# Patient Record
Sex: Female | Born: 1995
Health system: Southern US, Community
[De-identification: ages and names within clinical notes are randomized; demographics above are authoritative.]

## PROBLEM LIST (undated history)

## (undated) DIAGNOSIS — F410 Panic disorder [episodic paroxysmal anxiety] without agoraphobia: Secondary | ICD-10-CM

## (undated) DIAGNOSIS — F419 Anxiety disorder, unspecified: Secondary | ICD-10-CM

## (undated) HISTORY — PX: TONSILLECTOMY: SUR1361

## (undated) HISTORY — DX: Panic disorder (episodic paroxysmal anxiety): F41.0

## (undated) HISTORY — DX: Anxiety disorder, unspecified: F41.9

## (undated) HISTORY — PX: ADENOIDECTOMY: SUR15

---

## 2015-08-18 DIAGNOSIS — G8929 Other chronic pain: Secondary | ICD-10-CM

## 2015-08-18 HISTORY — DX: Other chronic pain: G89.29

## 2016-11-04 DIAGNOSIS — F411 Generalized anxiety disorder: Secondary | ICD-10-CM | POA: Insufficient documentation

## 2018-01-12 ENCOUNTER — Encounter: Payer: Self-pay | Admitting: Family Medicine

## 2018-01-12 ENCOUNTER — Ambulatory Visit: Payer: 59 | Admitting: Family Medicine

## 2018-01-12 VITALS — BP 124/77 | HR 86 | Temp 98.8°F | Ht 60.0 in | Wt 152.0 lb

## 2018-01-12 DIAGNOSIS — R350 Frequency of micturition: Secondary | ICD-10-CM

## 2018-01-12 DIAGNOSIS — Z7689 Persons encountering health services in other specified circumstances: Secondary | ICD-10-CM | POA: Diagnosis not present

## 2018-01-12 DIAGNOSIS — M5442 Lumbago with sciatica, left side: Secondary | ICD-10-CM

## 2018-01-12 DIAGNOSIS — Z3041 Encounter for surveillance of contraceptive pills: Secondary | ICD-10-CM | POA: Diagnosis not present

## 2018-01-12 DIAGNOSIS — G8929 Other chronic pain: Secondary | ICD-10-CM | POA: Insufficient documentation

## 2018-01-12 DIAGNOSIS — Z6829 Body mass index (BMI) 29.0-29.9, adult: Secondary | ICD-10-CM

## 2018-01-12 LAB — MICROSCOPIC EXAMINATION: Renal Epithel, UA: NONE SEEN /hpf

## 2018-01-12 LAB — PREGNANCY, URINE: PREG TEST UR: NEGATIVE

## 2018-01-12 LAB — URINALYSIS, COMPLETE
Bilirubin, UA: NEGATIVE
GLUCOSE, UA: NEGATIVE
Ketones, UA: NEGATIVE
NITRITE UA: NEGATIVE
Protein, UA: NEGATIVE
Specific Gravity, UA: >1.03 — ABNORMAL HIGH (ref 1.005–1.030)
UUROB: 0.2 mg/dL (ref 0.2–1.0)
pH, UA: 5.5 (ref 5.0–7.5)

## 2018-01-12 MED ORDER — NORETHIN ACE-ETH ESTRAD-FE 1-20 MG-MCG(24) PO CHEW: 1.0000 | CHEWABLE_TABLET | Freq: Every day | ORAL | 12 refills | Status: DC

## 2018-01-12 MED ORDER — NAPROXEN 500 MG PO TABS: 500.0000 mg | ORAL_TABLET | Freq: Two times a day (BID) | ORAL | 1 refills | Status: DC

## 2018-01-12 NOTE — Addendum Note (Signed)
Addended by: Baruch Gouty on: 01/12/2018 02:31 PM   Modules accepted: Orders

## 2018-01-12 NOTE — Patient Instructions (Signed)

## 2018-01-12 NOTE — Progress Notes (Signed)
Subjective:    Patient ID: Katrina Lin, female    DOB: [birth year], 22 y.o.   MRN: [id number]  Chief Complaint:  Establish Care and Back Pain (chronic, started 18 months ago after birth of child )   HPI: Katrina Lin is a 22 y.o. female presenting on 01/12/2018 for Establish Care and Back Pain (chronic, started 18 months ago after birth of child )  Pt presents today to establish care. Pt states she just moved to the area from [place] and needs to establish care with a PCP. Pt states she has an appointment with her GYN in December, but she is out of her oral contraceptive pills and needs a refill. Pt also reports chronic back pain. Pt states this started about 18 months ago. States she was going to PT and this was beneficial for the pain. Denies known injury causing the back pain. States she has had xrays and a MRI that were normal. Pt signed a release form for medical records. Pt states she has been managing the pain with ibuprofen and exercises. States she would like another referral to PT. She reports she slipped down two stairs in her home 2 days ago exacerbating her back pain. She reports low midline aching to sharp back pain that radiates into her left leg at times. Pt denies numbness, weakness, loss of bowel or bladder function, or gait abnormalities. Pt states the pain is worse with certain movements and bending. Pt also reports urinary frequency, denies dysuria, fever, or chills.    Relevant past medical, surgical, family, and social history reviewed and updated as indicated.  Allergies and medications reviewed and updated.   Past Medical History:  Diagnosis Date  . Anxiety   . Panic attacks     History reviewed. No pertinent surgical history.  Social History   Socioeconomic History  . Marital status: Single    Spouse name: Not on file  . Number of children: Not on file  . Years of education: Not on file  . Highest education level: Not on file  Occupational  History  . Not on file  Social Needs  . Financial resource strain: Not on file  . Food insecurity:    Worry: Not on file    Inability: Not on file  . Transportation needs:    Medical: Not on file    Non-medical: Not on file  Tobacco Use  . Smoking status: Never Smoker  . Smokeless tobacco: Never Used  Substance and Sexual Activity  . Alcohol use: Not Currently  . Drug use: Never  . Sexual activity: Not on file  Lifestyle  . Physical activity:    Days per week: Not on file    Minutes per session: Not on file  . Stress: Not on file  Relationships  . Social connections:    Talks on phone: Not on file    Gets together: Not on file    Attends religious service: Not on file    Active member of club or organization: Not on file    Attends meetings of clubs or organizations: Not on file    Relationship status: Not on file  . Intimate partner violence:    Fear of current or ex partner: Not on file    Emotionally abused: Not on file    Physically abused: Not on file    Forced sexual activity: Not on file  Other Topics Concern  . Not on file  Social History Narrative  .  Not on file    Outpatient Encounter Medications as of 01/12/2018  Medication Sig  . [DISCONTINUED] ibuprofen (ADVIL,MOTRIN) 600 MG tablet Take 600 mg by mouth as needed.  . [DISCONTINUED] Norethin Ace-Eth Estrad-FE (MINASTRIN 24 FE PO) Take 1 tablet by mouth daily.  . naproxen (NAPROSYN) 500 MG tablet Take 1 tablet (500 mg total) by mouth 2 (two) times daily with a meal.  . Norethin Ace-Eth Estrad-FE 1-20 MG-MCG(24) CHEW Chew 1 tablet by mouth daily for 28 days.   No facility-administered encounter medications on file as of 01/12/2018.     No Known Allergies  Review of Systems  Constitutional: Negative for activity change, chills, fatigue and fever.  Respiratory: Negative for cough, chest tightness and shortness of breath.   Cardiovascular: Negative for chest pain, palpitations and leg swelling.    Gastrointestinal: Negative for abdominal pain, constipation, diarrhea, nausea and vomiting.  Genitourinary: Positive for frequency. Negative for decreased urine volume, difficulty urinating, dyspareunia, dysuria, enuresis, flank pain, genital sores, hematuria, menstrual problem, pelvic pain, urgency, vaginal bleeding, vaginal discharge and vaginal pain.  Musculoskeletal: Positive for back pain (lower back). Negative for gait problem, neck pain and neck stiffness.  Neurological: Negative for dizziness, weakness, light-headedness, numbness and headaches.  Psychiatric/Behavioral: Negative for confusion.  All other systems reviewed and are negative.       Objective:    BP 124/77 (BP Location: Right Arm, Cuff Size: Normal)   Pulse 86   Temp 98.8 F (37.1 C) (Oral)   Ht 5' (1.524 m)   Wt 152 lb (68.9 kg)   BMI 29.69 kg/m    Wt Readings from Last 3 Encounters:  01/12/18 152 lb (68.9 kg)    Physical Exam  Constitutional: She is oriented to person, place, and time. She appears well-developed and well-nourished. She is cooperative. No distress.  HENT:  Head: Normocephalic and atraumatic.  Right Ear: Hearing, tympanic membrane, external ear and ear canal normal.  Left Ear: Hearing, tympanic membrane, external ear and ear canal normal.  Nose: Nose normal.  Mouth/Throat: Uvula is midline, oropharynx is clear and moist and mucous membranes are normal.  Eyes: Pupils are equal, round, and reactive to light. EOM are normal.  Neck: Trachea normal, full passive range of motion without pain and phonation normal. Neck supple.  Cardiovascular: Normal rate, regular rhythm, normal heart sounds and intact distal pulses. Exam reveals no gallop and no friction rub.  No murmur heard. Pulmonary/Chest: Effort normal and breath sounds normal. No respiratory distress.  Abdominal: Soft. Bowel sounds are normal. There is no hepatosplenomegaly. There is no tenderness. There is no CVA tenderness.   Musculoskeletal:       Lumbar back: She exhibits tenderness and pain. She exhibits normal range of motion, no bony tenderness, no swelling, no edema, no deformity, no laceration, no spasm and normal pulse.       Back:  Neurological: She is alert and oriented to person, place, and time. She has normal strength and normal reflexes. No sensory deficit.  Skin: Skin is warm, dry and intact. Capillary refill takes less than 2 seconds.  Psychiatric: She has a normal mood and affect. Her speech is normal and behavior is normal. Judgment and thought content normal. Cognition and memory are normal.  Nursing note and vitals reviewed.   Urine pregnancy negative in office. Urine dip: negative for nitrites, 1+ leukocytes, and 1+ blood.    Pertinent labs & imaging results that were available during my care of the patient were reviewed by me  and considered in my medical decision making.  Assessment & Plan:  Katrina Lin was seen today for establish care and back pain.  Diagnoses and all orders for this visit:  Encounter to establish care  Chronic midline low back pain with left-sided sciatica Back strengthening exercises. Referral to PT. Ibuprofen switched to naproxen. Conservative measures for 4-6 weeks and then reevaluate. Will get medical records from previous provider.  -     naproxen (NAPROSYN) 500 MG tablet; Take 1 tablet (500 mg total) by mouth 2 (two) times daily with a meal. -     Ambulatory referral to Physical Therapy  Encounter for surveillance of contraceptive pills Urine pregnancy negative in office. Has been taking combined oral contraceptives without adverse reactions.  -     Pregnancy, urine -     Norethin Ace-Eth Estrad-FE 1-20 MG-MCG(24) CHEW; Chew 1 tablet by mouth daily for 28 days.  Frequency of urination Denies dysuria, fever, or chills. Recently treated for UTI. Urinalysis unremarkable in office (pt at end of menstrual cycle).  -     Urinalysis, Complete  BMI  29.0-29.9,adult Diet and exercise encouraged.      Continue all other maintenance medications.  Follow up plan: Return in about 4 weeks (around 02/09/2018), or if symptoms worsen or fail to improve.  Educational handout given for back exercises  The above assessment and management plan was discussed with the patient. The patient verbalized understanding of and has agreed to the management plan. Patient is aware to call the clinic if symptoms persist or worsen. Patient is aware when to return to the clinic for a follow-up visit. Patient educated on when it is appropriate to go to the emergency department.   Monia Pouch, FNP-C [facility] [phone]

## 2018-01-17 ENCOUNTER — Telehealth: Payer: Self-pay | Admitting: Family Medicine

## 2018-01-17 NOTE — Telephone Encounter (Signed)
TC to PA dept [phone] pending review Mom aware this is in process, Rx has been sent to pharmacy already & she can pay out of pocket if she would like for first Rx

## 2018-01-17 NOTE — Telephone Encounter (Signed)
Katrina Lin had sent this in as brand name only.  Did you get a prior authorization about this?

## 2018-01-31 ENCOUNTER — Encounter: Payer: Self-pay | Admitting: Physical Therapy

## 2018-01-31 ENCOUNTER — Other Ambulatory Visit: Payer: Self-pay

## 2018-01-31 ENCOUNTER — Ambulatory Visit: Payer: 59 | Attending: Family Medicine | Admitting: Physical Therapy

## 2018-01-31 DIAGNOSIS — M6281 Muscle weakness (generalized): Secondary | ICD-10-CM | POA: Insufficient documentation

## 2018-01-31 DIAGNOSIS — M545 Low back pain: Secondary | ICD-10-CM | POA: Diagnosis present

## 2018-01-31 DIAGNOSIS — G8929 Other chronic pain: Secondary | ICD-10-CM | POA: Diagnosis present

## 2018-01-31 NOTE — Therapy (Signed)
[facility] [place], [place], [address] Phone: [phone]   Fax:  [phone]  Physical Therapy Evaluation  Patient Details  Name: Katrina Lin MRN: [id number] Date of Birth: 07/08/1995 Referring Provider (PT): Darla Lesches, FNP   Encounter Date: 01/31/2018  PT End of Session - 01/31/18 1428    Visit Number  1    Number of Visits  12    Date for PT Re-Evaluation  03/21/18    Authorization Type  Progress note every 10th visit    PT Start Time  1350    PT Stop Time  1426    PT Time Calculation (min)  36 min    Activity Tolerance  Patient tolerated treatment well    Behavior During Therapy  [facility] for tasks assessed/performed       Past Medical History:  Diagnosis Date  . Anxiety   . Panic attacks     History reviewed. No pertinent surgical history.  There were no vitals filed for this visit.   Subjective Assessment - 01/31/18 1639    Subjective  Patient reports ongoing low back pain that began last year after having her baby. She stated she had an epidural during delivery. Patient reported she had physical therapy in [place] for 3 months which help with her back pain but she has not been consistent with exercises. Patient reports she has the most difficulties with bending to pick up child and bathe baby. Patient denies numbness and tingling. Pain at worst is an 8/10 and pain at best is 5/10. Patient's goals are to decrease pain, improve strength, improve ability to perform house activities and caretaking for baby and stand greater than 25 mins.    Limitations  House hold activities;Standing;Walking    How long can you stand comfortably?  <25 minutes    Diagnostic tests  X-Ray, MRI: normal    Patient Stated Goals  improve pain improve mechanics    Currently in Pain?  Yes    Pain Score  7     Pain Location  Back    Pain Orientation  Right;Left;Lower    Pain Descriptors / Indicators  Sore;Discomfort    Pain  Type  Chronic pain    Pain Onset  More than a month ago    Pain Frequency  Constant    Aggravating Factors   bending forward, lifting heavy objects    Pain Relieving Factors  laying down, heat, TENs unit         [facility] PT Assessment - 01/31/18 0001      Assessment   Medical Diagnosis  Chronic low back pain with left sided sciaticia    Referring Provider (PT)  Darla Lesches, FNP    Onset Date/Surgical Date  --   2018   Next MD Visit  N/A    Prior Therapy  yes, in [place]      Precautions   Precautions  None      Restrictions   Weight Bearing Restrictions  No      Balance Screen   Has the patient fallen in the past 6 months  Yes    How many times?  1 fell down steps    Has the patient had a decrease in activity level because of a fear of falling?   No    Is the patient reluctant to leave their home because of a fear of falling?   No      Home Environment   Living  Environment  Private residence    Living Arrangements  Children;Spouse/significant other   Family   Type of Home  House      Prior Function   Level of Independence  Independent      Posture/Postural Control   Posture/Postural Control  Postural limitations    Postural Limitations  Rounded Shoulders;Forward head;Increased lumbar lordosis      ROM / Strength   AROM / PROM / Strength  AROM;Strength      AROM   AROM Assessment Site  Lumbar    Lumbar Flexion  12 inches finger tip to floor    Lumbar Extension  15 degrees    Lumbar - Right Side Bend  19 inches finger tip to floor    Lumbar - Left Side Bend  19 inches finger tip to floor      Strength   Strength Assessment Site  Hip;Knee    Right/Left Hip  Right;Left    Right Hip Flexion  4/5    Right Hip Extension  3+/5    Right Hip ABduction  3+/5    Left Hip Flexion  4+/5    Left Hip Extension  3+/5    Left Hip ABduction  4-/5    Right/Left Knee  Right;Left    Right Knee Flexion  4-/5    Right Knee Extension  4/5    Left Knee Flexion  4/5    Left  Knee Extension  4/5      Flexibility   Soft Tissue Assessment /Muscle Length  --      Palpation   Palpation comment  tender to palpation to lumbar paraspinals and QL bilaterally      Transfers   Transfers  Independent with all Transfers      Ambulation/Gait   Gait Pattern  Within Functional Limits                Objective measurements completed on examination: See above findings.              PT Education - 01/31/18 1808    Education Details  draw ins, scapular retractions, bridging, hip adduction, hamstring stretches, calf stretches    Person(s) Educated  Patient    Methods  Explanation;Handout    Comprehension  Verbalized understanding;Returned demonstration       PT Short Term Goals - 01/31/18 1753      PT SHORT TERM GOAL #1   Title  STG=LTG        PT Long Term Goals - 01/31/18 1753      PT LONG TERM GOAL #1   Title  Patient will be independent with HEP and its progression    Time  6    Period  Weeks    Status  New      PT LONG TERM GOAL #2   Title  Patient will demonstrate 4+/5 or greater LE strength bilaterally to improve stability during functional tasks    Time  6    Period  Weeks    Status  New      PT LONG TERM GOAL #3   Title  Patient will demonstrate proper squat and lifting mechanics to protect back during functional tasks and lifting child.    Time  6    Period  Weeks    Status  New      PT LONG TERM GOAL #4   Title  Patient will report ability to perform ADLs and child care duties with low back pain less  than or equal to 3/10.    Time  6    Period  Weeks    Status  New             Plan - 01/31/18 1810    Clinical Impression Statement  Patient is a 22 year old female who presents to physical therapy with localized bilateral low back pain and decreased LE strength bilaterally L>R. Patient noted with tenderness to palpation to bilateral QLs. Patient noted with normal gait pattern. Patient demonstrated poor squat  mechanics as noted by increased weight bearing on toes, poor hip hinge, and dynamic knee valgus during descent. Patient would benefit from skilled physical therapy to address deficits, improve body mechanics, and address patient's goals.     Clinical Presentation  Stable    Clinical Decision Making  Low    Rehab Potential  Excellent    PT Frequency  2x / week    PT Duration  6 weeks    PT Treatment/Interventions  Passive range of motion;Moist Heat;Ultrasound;Cryotherapy;Electrical Stimulation;Therapeutic exercise;Therapeutic activities;Functional mobility training;Stair training;Gait training;Neuromuscular re-education;Manual techniques;Patient/family education    PT Next Visit Plan  bike, core strengthening, LE strengthening, e-stim and moist heat for pain relief    PT Home Exercise Plan  see patient education section     Consulted and Agree with Plan of Care  Patient       Patient will benefit from skilled therapeutic intervention in order to improve the following deficits and impairments:  Pain, Decreased activity tolerance, Decreased range of motion, Decreased strength, Postural dysfunction  Visit Diagnosis: Chronic midline low back pain, unspecified whether sciatica present - Plan: PT plan of care cert/re-cert  Muscle weakness (generalized) - Plan: PT plan of care cert/re-cert     Problem List Patient Active Problem List   Diagnosis Date Noted  . Chronic midline low back pain with left-sided sciatica 01/12/2018  . BMI 29.0-29.9,adult 01/12/2018    Gabriela Eves, PT, DPT 01/31/2018, 6:17 PM  [facility] [address] [place], [place], [address] Phone: [phone]   Fax:  [phone]  Name: Khamiya Varin MRN: [id number] Date of Birth: [birth year]

## 2018-02-06 ENCOUNTER — Ambulatory Visit: Payer: 59 | Attending: Family Medicine | Admitting: Physical Therapy

## 2018-02-06 ENCOUNTER — Encounter: Payer: Self-pay | Admitting: Physical Therapy

## 2018-02-06 DIAGNOSIS — M545 Low back pain: Secondary | ICD-10-CM | POA: Insufficient documentation

## 2018-02-06 DIAGNOSIS — G8929 Other chronic pain: Secondary | ICD-10-CM | POA: Insufficient documentation

## 2018-02-06 DIAGNOSIS — M6281 Muscle weakness (generalized): Secondary | ICD-10-CM | POA: Diagnosis present

## 2018-02-06 NOTE — Therapy (Signed)
[facility] [place], [place], [address] Phone: [phone]   Fax:  [phone]  Physical Therapy Treatment  Patient Details  Name: Katrina Lin MRN: [id number] Date of Birth: 1995/12/09 Referring Provider (PT): Darla Lesches, FNP   Encounter Date: 02/06/2018  PT End of Session - 02/06/18 1520    Visit Number  2    Number of Visits  12    Date for PT Re-Evaluation  03/21/18    Authorization Type  Progress note every 10th visit    PT Start Time  4650    PT Stop Time  1615    PT Time Calculation (min)  59 min    Activity Tolerance  Patient tolerated treatment well    Behavior During Therapy  [facility] for tasks assessed/performed       Past Medical History:  Diagnosis Date  . Anxiety   . Panic attacks     History reviewed. No pertinent surgical history.  There were no vitals filed for this visit.  Subjective Assessment - 02/06/18 1518    Subjective  Reports compliance with HEP and some pain.     Limitations  House hold activities;Standing;Walking    How long can you stand comfortably?  <25 minutes    Diagnostic tests  X-Ray, MRI: normal    Patient Stated Goals  improve pain improve mechanics    Currently in Pain?  Yes    Pain Score  7     Pain Location  Back    Pain Orientation  Lower    Pain Descriptors / Indicators  Discomfort    Pain Type  Chronic pain    Pain Onset  More than a month ago         [facility] PT Assessment - 02/06/18 0001      Assessment   Medical Diagnosis  Chronic low back pain with left sided sciaticia    Referring Provider (PT)  Darla Lesches, FNP    Next MD Visit  N/A    Prior Therapy  yes, in [place]      Precautions   Precautions  None      Restrictions   Weight Bearing Restrictions  No                   [facility] Adult PT Treatment/Exercise - 02/06/18 0001      Exercises   Exercises  Lumbar      Lumbar Exercises: Aerobic   Stationary Bike  L2 x12 min      Lumbar Exercises: Supine   Ab Set  10 reps;5 seconds    Glut Set  15 reps;5 seconds    Clam  20 reps;Limitations    Clam Limitations  green theraband    Bridge  20 reps;5 seconds    Straight Leg Raise  20 reps      Modalities   Modalities  Electrical Stimulation;Moist Heat      Moist Heat Therapy   Number Minutes Moist Heat  15 Minutes    Moist Heat Location  Lumbar Spine      Electrical Stimulation   Electrical Stimulation Location  B lumbar paraspinals     Electrical Stimulation Action  Pre-Mod    Electrical Stimulation Parameters  80-150 hz x15 min    Electrical Stimulation Goals  Pain               PT Short Term Goals - 01/31/18 1753      PT SHORT TERM GOAL #  1   Title  STG=LTG        PT Long Term Goals - 02/06/18 1623      PT LONG TERM GOAL #1   Title  Patient will be independent with HEP and its progression    Time  6    Period  Weeks    Status  Achieved      PT LONG TERM GOAL #2   Title  Patient will demonstrate 4+/5 or greater LE strength bilaterally to improve stability during functional tasks    Time  6    Period  Weeks    Status  On-going      PT LONG TERM GOAL #3   Title  Patient will demonstrate proper squat and lifting mechanics to protect back during functional tasks and lifting child.    Time  6    Period  Weeks    Status  On-going      PT LONG TERM GOAL #4   Title  Patient will report ability to perform ADLs and child care duties with low back pain less than or equal to 3/10.    Time  6    Period  Weeks    Status  On-going            Plan - 02/06/18 1607    Clinical Impression Statement  Patient tolerated today's treatment well with 7/10 LBP. Patient able to be guided throughout therex with core activation. No reports of any increased lumbar pain with exercises today only reports of soreness. Patient reports more pain with functional activities such as bending forwards or caring for her daughter in the bathtub, etc. Patient  encouraged to continue HEP at home and HEP and therex would be progressed as necessary. Normal modalities response noted following removal of the modalities.    Rehab Potential  Excellent    PT Frequency  2x / week    PT Duration  6 weeks    PT Treatment/Interventions  Passive range of motion;Moist Heat;Ultrasound;Cryotherapy;Electrical Stimulation;Therapeutic exercise;Therapeutic activities;Functional mobility training;Stair training;Gait training;Neuromuscular re-education;Manual techniques;Patient/family education    PT Next Visit Plan  bike, core strengthening, LE strengthening, e-stim and moist heat for pain relief    PT Home Exercise Plan  see patient education section     Consulted and Agree with Plan of Care  Patient       Patient will benefit from skilled therapeutic intervention in order to improve the following deficits and impairments:  Pain, Decreased activity tolerance, Decreased range of motion, Decreased strength, Postural dysfunction  Visit Diagnosis: Chronic midline low back pain, unspecified whether sciatica present  Muscle weakness (generalized)     Problem List Patient Active Problem List   Diagnosis Date Noted  . Chronic midline low back pain with left-sided sciatica 01/12/2018  . BMI 29.0-29.9,adult 01/12/2018    Standley Brooking, PTA 02/06/2018, 4:24 PM  [facility] [address] [place], [place], [address] Phone: [phone]   Fax:  [phone]  Name: Katrina Lin MRN: [id number] Date of Birth: [birth year]

## 2018-02-09 ENCOUNTER — Ambulatory Visit: Payer: 59 | Admitting: Physical Therapy

## 2018-02-09 ENCOUNTER — Encounter: Payer: Self-pay | Admitting: Physical Therapy

## 2018-02-09 DIAGNOSIS — M6281 Muscle weakness (generalized): Secondary | ICD-10-CM

## 2018-02-09 DIAGNOSIS — M545 Low back pain: Secondary | ICD-10-CM | POA: Diagnosis not present

## 2018-02-09 DIAGNOSIS — G8929 Other chronic pain: Secondary | ICD-10-CM

## 2018-02-09 NOTE — Patient Instructions (Signed)
     Standing lat pull with theraband  Anchor bands higher than your head. Start with your arms straight out in front of you at shoulder height (or a little above).  Pull bands down next to your body and then slowly return to the starting position.  10-30 x1day      Upper / Lower Extremity Extension (All-Fours)    Tighten stomach and raise right leg and opposite arm. Keep trunk rigid. Repeat __5__ times per set. Do __1-2__ sets per session. Do __1-2__ sessions per day.   Hooklying Bridge with band  Raise your hips while maintaining a posterior pelvic tilt, abdominal draw in and pelvic floor tightening.  While elevated, push out against the band and hold.

## 2018-02-09 NOTE — Therapy (Signed)
[facility] [place], [place], [address] Phone: [phone]   Fax:  [phone]  Physical Therapy Treatment  Patient Details  Name: Katrina Lin MRN: [id number] Date of Birth: [birth year] Referring Provider (PT): Darla Lesches, FNP   Encounter Date: 02/09/2018  PT End of Session - 02/09/18 1554    Visit Number  3    Number of Visits  12    Date for PT Re-Evaluation  03/21/18    Authorization Type  Progress note every 10th visit    PT Start Time  1515    PT Stop Time  1608    PT Time Calculation (min)  53 min    Activity Tolerance  Patient tolerated treatment well    Behavior During Therapy  [facility] for tasks assessed/performed       Past Medical History:  Diagnosis Date  . Anxiety   . Panic attacks     History reviewed. No pertinent surgical history.  There were no vitals filed for this visit.  Subjective Assessment - 02/09/18 1519    Subjective  Patient reported doing HEP and some ongoing pain    Limitations  House hold activities;Standing;Walking    How long can you stand comfortably?  <25 minutes    Diagnostic tests  X-Ray, MRI: normal    Patient Stated Goals  improve pain improve mechanics    Currently in Pain?  Yes    Pain Score  5     Pain Location  Back    Pain Orientation  Lower    Pain Descriptors / Indicators  Discomfort    Pain Type  Chronic pain    Pain Onset  More than a month ago    Pain Frequency  Constant    Aggravating Factors   bening and lifting    Pain Relieving Factors  at rest                       [facility] Adult PT Treatment/Exercise - 02/09/18 0001      Exercises   Exercises  Lumbar      Lumbar Exercises: Aerobic   Nustep  58min L5 core focus LE only      Lumbar Exercises: Standing   Row  Strengthening;Both;20 reps;Theraband    Theraband Level (Row)  Other (comment)    Row Limitations  pink XTS    Shoulder Extension  Strengthening    Theraband Level (Shoulder  Extension)  Other (comment)    Shoulder Extension Limitations  pink XTS    Other Standing Lumbar Exercises  wall push ups 2x10      Lumbar Exercises: Supine   Bridge with Ball Squeeze  10 reps    Bridge with clamshell  10 reps   red t-band   Straight Leg Raise  20 reps      Lumbar Exercises: Quadruped   Single Arm Raise  Right;Left;5 reps;5 seconds    Straight Leg Raise  5 reps;3 seconds      Moist Heat Therapy   Number Minutes Moist Heat  15 Minutes    Moist Heat Location  Lumbar Spine      Electrical Stimulation   Electrical Stimulation Location  B lumbar paraspinals     Electrical Stimulation Action  IFC    Electrical Stimulation Parameters  80+150hz  x33min    Electrical Stimulation Goals  Pain             PT Education - 02/09/18 1558  Education Details  HEP progression    Person(s) Educated  Patient    Methods  Demonstration;Handout;Explanation    Comprehension  Verbalized understanding;Returned demonstration       PT Short Term Goals - 01/31/18 1753      PT SHORT TERM GOAL #1   Title  STG=LTG        PT Long Term Goals - 02/06/18 1623      PT LONG TERM GOAL #1   Title  Patient will be independent with HEP and its progression    Time  6    Period  Weeks    Status  Achieved      PT LONG TERM GOAL #2   Title  Patient will demonstrate 4+/5 or greater LE strength bilaterally to improve stability during functional tasks    Time  6    Period  Weeks    Status  On-going      PT LONG TERM GOAL #3   Title  Patient will demonstrate proper squat and lifting mechanics to protect back during functional tasks and lifting child.    Time  6    Period  Weeks    Status  On-going      PT LONG TERM GOAL #4   Title  Patient will report ability to perform ADLs and child care duties with low back pain less than or equal to 3/10.    Time  6    Period  Weeks    Status  On-going            Plan - 02/09/18 1558    Clinical Impression Statement  Patient  tolerated treatment well today. Patient able to progress with core activation exercises with good technique. HEP provided for progression. Educated patient on core activation and posture awareness techniques with ADL's, lifting and bending. Goals progressing at this time.     Rehab Potential  Excellent    PT Frequency  2x / week    PT Duration  6 weeks    PT Treatment/Interventions  Passive range of motion;Moist Heat;Ultrasound;Cryotherapy;Electrical Stimulation;Therapeutic exercise;Therapeutic activities;Functional mobility training;Stair training;Gait training;Neuromuscular re-education;Manual techniques;Patient/family education    PT Next Visit Plan  cont with bike, core strengthening, LE strengthening, e-stim and moist heat for pain relief    Consulted and Agree with Plan of Care  Patient       Patient will benefit from skilled therapeutic intervention in order to improve the following deficits and impairments:  Pain, Decreased activity tolerance, Decreased range of motion, Decreased strength, Postural dysfunction  Visit Diagnosis: Chronic midline low back pain, unspecified whether sciatica present  Muscle weakness (generalized)     Problem List Patient Active Problem List   Diagnosis Date Noted  . Chronic midline low back pain with left-sided sciatica 01/12/2018  . BMI 29.0-29.9,adult 01/12/2018    Elza Varricchio P, PTA 02/09/2018, 4:10 PM  [facility] [place], [place], [address] Phone: [phone]   Fax:  [phone]  Name: Addelyn Alleman MRN: [id number] Date of Birth: 08-29-1995

## 2018-02-13 ENCOUNTER — Encounter: Payer: Self-pay | Admitting: Physical Therapy

## 2018-02-13 ENCOUNTER — Ambulatory Visit: Payer: 59 | Admitting: Physical Therapy

## 2018-02-13 DIAGNOSIS — M545 Low back pain, unspecified: Secondary | ICD-10-CM

## 2018-02-13 DIAGNOSIS — G8929 Other chronic pain: Secondary | ICD-10-CM

## 2018-02-13 DIAGNOSIS — M6281 Muscle weakness (generalized): Secondary | ICD-10-CM

## 2018-02-13 NOTE — Therapy (Signed)
[facility] [place], [place], [address] Phone: [phone]   Fax:  [phone]  Physical Therapy Treatment  Patient Details  Name: Katrina Lin MRN: [id number] Date of Birth: 1995/11/27 Referring Provider (PT): Darla Lesches, FNP   Encounter Date: 02/13/2018  PT End of Session - 02/13/18 1521    Visit Number  4    Number of Visits  12    Date for PT Re-Evaluation  03/21/18    Authorization Type  Progress note every 10th visit    PT Start Time  1515    PT Stop Time  1609    PT Time Calculation (min)  54 min    Activity Tolerance  Patient tolerated treatment well    Behavior During Therapy  [facility] for tasks assessed/performed       Past Medical History:  Diagnosis Date  . Anxiety   . Panic attacks     History reviewed. No pertinent surgical history.  There were no vitals filed for this visit.  Subjective Assessment - 02/13/18 1522    Subjective  Patient reported feeling more pain today due to the rain.    Limitations  House hold activities;Standing;Walking    How long can you stand comfortably?  <25 minutes    Diagnostic tests  X-Ray, MRI: normal    Patient Stated Goals  improve pain improve mechanics    Currently in Pain?  Yes    Pain Score  8     Pain Location  Back    Pain Orientation  Lower    Pain Descriptors / Indicators  Discomfort    Pain Onset  More than a month ago    Pain Frequency  Constant         [facility] PT Assessment - 02/13/18 0001      Assessment   Medical Diagnosis  Chronic low back pain with left sided sciaticia    Referring Provider (PT)  Darla Lesches, FNP    Next MD Visit  N/A    Prior Therapy  yes, in [place]      Precautions   Precautions  None      Restrictions   Weight Bearing Restrictions  No                   [facility] Adult PT Treatment/Exercise - 02/13/18 0001      Exercises   Exercises  Lumbar      Lumbar Exercises: Aerobic   Nustep  Level 4 x15 mins  UE/LE with draw in      Lumbar Exercises: Standing   Wall Slides  20 reps;2 seconds    Row  Strengthening;Both;20 reps;Theraband    Theraband Level (Row)  Other (comment)    Row Limitations  Pink XTS    Shoulder Extension  Strengthening;Both;20 reps    Theraband Level (Shoulder Extension)  Other (comment)    Shoulder Extension Limitations  Pink XTS    Other Standing Lumbar Exercises  wall push ups 2x10    Other Standing Lumbar Exercises  wood chops 2x10 each      Lumbar Exercises: Supine   Bent Knee Raise  20 reps    Bridge with [employer]  20 reps      Modalities   Modalities  Electrical Stimulation;Moist Heat      Moist Heat Therapy   Number Minutes Moist Heat  15 Minutes    Moist Heat Location  Lumbar Spine      Electrical Stimulation  Electrical Stimulation Location  B lumbar paraspinals     Electrical Stimulation Action  IFC    Electrical Stimulation Parameters  80-150 hz x15 min    Electrical Stimulation Goals  Pain               PT Short Term Goals - 01/31/18 1753      PT SHORT TERM GOAL #1   Title  STG=LTG        PT Long Term Goals - 02/06/18 1623      PT LONG TERM GOAL #1   Title  Patient will be independent with HEP and its progression    Time  6    Period  Weeks    Status  Achieved      PT LONG TERM GOAL #2   Title  Patient will demonstrate 4+/5 or greater LE strength bilaterally to improve stability during functional tasks    Time  6    Period  Weeks    Status  On-going      PT LONG TERM GOAL #3   Title  Patient will demonstrate proper squat and lifting mechanics to protect back during functional tasks and lifting child.    Time  6    Period  Weeks    Status  On-going      PT LONG TERM GOAL #4   Title  Patient will report ability to perform ADLs and child care duties with low back pain less than or equal to 3/10.    Time  6    Period  Weeks    Status  On-going            Plan - 02/13/18 1921    Clinical Impression  Statement  Patient was able to tolerate treatment well despite reports of low back pain. Patient was able to demonstrate good form with all exercises and was able to demonstrate good core activation. Normal response to modaliites at end of session.    Clinical Presentation  Stable    Clinical Decision Making  Low    Rehab Potential  Excellent    PT Frequency  2x / week    PT Duration  6 weeks    PT Treatment/Interventions  Passive range of motion;Moist Heat;Ultrasound;Cryotherapy;Electrical Stimulation;Therapeutic exercise;Therapeutic activities;Functional mobility training;Stair training;Gait training;Neuromuscular re-education;Manual techniques;Patient/family education    PT Next Visit Plan  cont with bike, core strengthening, LE strengthening, e-stim and moist heat for pain relief    PT Home Exercise Plan  see patient education section     Consulted and Agree with Plan of Care  Patient       Patient will benefit from skilled therapeutic intervention in order to improve the following deficits and impairments:  Pain, Decreased activity tolerance, Decreased range of motion, Decreased strength, Postural dysfunction  Visit Diagnosis: Chronic midline low back pain, unspecified whether sciatica present  Muscle weakness (generalized)     Problem List Patient Active Problem List   Diagnosis Date Noted  . Chronic midline low back pain with left-sided sciatica 01/12/2018  . BMI 29.0-29.9,adult 01/12/2018   Gabriela Eves, PT, DPT 02/13/2018, 7:28 PM  [facility] [address] [place], [place], [address] Phone: [phone]   Fax:  [phone]  Name: Katrina Lin MRN: [id number] Date of Birth: May 07, 1995

## 2018-02-15 ENCOUNTER — Ambulatory Visit: Payer: 59 | Admitting: Physical Therapy

## 2018-02-15 ENCOUNTER — Encounter: Payer: Self-pay | Admitting: Physical Therapy

## 2018-02-15 DIAGNOSIS — M6281 Muscle weakness (generalized): Secondary | ICD-10-CM

## 2018-02-15 DIAGNOSIS — G8929 Other chronic pain: Secondary | ICD-10-CM

## 2018-02-15 DIAGNOSIS — M545 Low back pain, unspecified: Secondary | ICD-10-CM

## 2018-02-15 NOTE — Therapy (Signed)
[facility] [place], [place], [address] Phone: [phone]   Fax:  [phone]  Physical Therapy Treatment  Patient Details  Name: Katrina Lin MRN: [id number] Date of Birth: November 20, 1995 Referring Provider (PT): Darla Lesches, FNP   Encounter Date: 02/15/2018  PT End of Session - 02/15/18 1536    Visit Number  5    Number of Visits  12    Date for PT Re-Evaluation  03/21/18    Authorization Type  Progress note every 10th visit    PT Start Time  5329    PT Stop Time  1528    PT Time Calculation (min)  57 min    Activity Tolerance  Patient tolerated treatment well    Behavior During Therapy  [facility] for tasks assessed/performed       Past Medical History:  Diagnosis Date  . Anxiety   . Panic attacks     History reviewed. No pertinent surgical history.  There were no vitals filed for this visit.      [facility] PT Assessment - 02/15/18 0001      Assessment   Medical Diagnosis  Chronic low back pain with left sided sciaticia    Referring Provider (PT)  Darla Lesches, FNP    Next MD Visit  N/A    Prior Therapy  yes, in [place]                   [facility] Adult PT Treatment/Exercise - 02/15/18 0001      Exercises   Exercises  Lumbar      Lumbar Exercises: Aerobic   Nustep  Level 4 x15 mins UE/LE with draw in      Lumbar Exercises: Standing   Row  Strengthening;Both;20 reps;Theraband   2x15   Theraband Level (Row)  Other (comment)    Row Limitations  Pink XTS    Shoulder Extension  Strengthening;Both;20 reps;10 reps   2x15   Theraband Level (Shoulder Extension)  Other (comment)    Shoulder Extension Limitations  Pink XTS    Other Standing Lumbar Exercises  wall push ups 2x10      Lumbar Exercises: Supine   Bridge  20 reps;5 seconds    Single Leg Bridge  10 reps      Modalities   Modalities  Electrical Stimulation;Moist Heat      Moist Heat Therapy   Number Minutes Moist Heat  15 Minutes     Moist Heat Location  Lumbar Spine      Electrical Stimulation   Electrical Stimulation Location  B lumbar paraspinals     Electrical Stimulation Action  IFC    Electrical Stimulation Parameters  80-150 hz x15 mins    Electrical Stimulation Goals  Pain               PT Short Term Goals - 01/31/18 1753      PT SHORT TERM GOAL #1   Title  STG=LTG        PT Long Term Goals - 02/06/18 1623      PT LONG TERM GOAL #1   Title  Patient will be independent with HEP and its progression    Time  6    Period  Weeks    Status  Achieved      PT LONG TERM GOAL #2   Title  Patient will demonstrate 4+/5 or greater LE strength bilaterally to improve stability during functional tasks    Time  6  Period  Weeks    Status  On-going      PT LONG TERM GOAL #3   Title  Patient will demonstrate proper squat and lifting mechanics to protect back during functional tasks and lifting child.    Time  6    Period  Weeks    Status  On-going      PT LONG TERM GOAL #4   Title  Patient will report ability to perform ADLs and child care duties with low back pain less than or equal to 3/10.    Time  6    Period  Weeks    Status  On-going            Plan - 02/15/18 1537    Clinical Impression Statement  Patient was able to tolerate progression of treatment well with no reports of increased low back pain. Patient required intermittent verbal cuing for form but was able to demonstrate with improved form after. Patient instructed to continue exercises at home. Patient reported understanding. Normal response to modalities upon removal.     Clinical Presentation  Stable    Clinical Decision Making  Low    Rehab Potential  Excellent    PT Frequency  2x / week    PT Duration  6 weeks    PT Treatment/Interventions  Passive range of motion;Moist Heat;Ultrasound;Cryotherapy;Electrical Stimulation;Therapeutic exercise;Therapeutic activities;Functional mobility training;Stair training;Gait  training;Neuromuscular re-education;Manual techniques;Patient/family education    PT Next Visit Plan  cont with bike, core strengthening, LE strengthening, e-stim and moist heat for pain relief    PT Home Exercise Plan  see patient education section     Consulted and Agree with Plan of Care  Patient       Patient will benefit from skilled therapeutic intervention in order to improve the following deficits and impairments:  Pain, Decreased activity tolerance, Decreased range of motion, Decreased strength, Postural dysfunction  Visit Diagnosis: Chronic midline low back pain, unspecified whether sciatica present  Muscle weakness (generalized)     Problem List Patient Active Problem List   Diagnosis Date Noted  . Chronic midline low back pain with left-sided sciatica 01/12/2018  . BMI 29.0-29.9,adult 01/12/2018   Gabriela Eves, PT, DPT 02/15/2018, 3:38 PM  [facility] [place], [place], [address] Phone: [phone]   Fax:  [phone]  Name: Adrieanna Boteler MRN: [id number] Date of Birth: 1995-03-18

## 2018-02-20 ENCOUNTER — Encounter: Payer: Self-pay | Admitting: Physical Therapy

## 2018-02-20 ENCOUNTER — Ambulatory Visit: Payer: 59 | Admitting: Physical Therapy

## 2018-02-20 DIAGNOSIS — M545 Low back pain, unspecified: Secondary | ICD-10-CM

## 2018-02-20 DIAGNOSIS — G8929 Other chronic pain: Secondary | ICD-10-CM

## 2018-02-20 DIAGNOSIS — M6281 Muscle weakness (generalized): Secondary | ICD-10-CM

## 2018-02-20 NOTE — Therapy (Signed)
[facility] [place], [place], [address] Phone: [phone]   Fax:  [phone]  Physical Therapy Treatment  Patient Details  Name: Katrina Lin MRN: [id number] Date of Birth: 09-28-1995 Referring Provider (PT): Darla Lesches, FNP   Encounter Date: 02/20/2018  PT End of Session - 02/20/18 1510    Visit Number  6    Number of Visits  12    Date for PT Re-Evaluation  03/21/18    Authorization Type  Progress note every 10th visit    PT Start Time  1434    PT Stop Time  1526    PT Time Calculation (min)  52 min    Activity Tolerance  Patient tolerated treatment well    Behavior During Therapy  [facility] for tasks assessed/performed       Past Medical History:  Diagnosis Date  . Anxiety   . Panic attacks     History reviewed. No pertinent surgical history.  There were no vitals filed for this visit.  Subjective Assessment - 02/20/18 1435    Subjective  Patient reported some improvement today    Limitations  House hold activities;Standing;Walking    How long can you stand comfortably?  <25 minutes    Diagnostic tests  X-Ray, MRI: normal    Patient Stated Goals  improve pain improve mechanics    Currently in Pain?  Yes    Pain Score  7     Pain Location  Back    Pain Orientation  Lower    Pain Descriptors / Indicators  Discomfort    Pain Type  Chronic pain    Pain Onset  More than a month ago    Pain Frequency  Intermittent    Aggravating Factors   bending and lifting activity    Pain Relieving Factors  at rest                       [facility] Adult PT Treatment/Exercise - 02/20/18 0001      Lumbar Exercises: Aerobic   Nustep  Level 5 x15 mins UE/LE with draw in      Lumbar Exercises: Standing   Row  Strengthening;Both;20 reps;Theraband;10 reps    Theraband Level (Row)  Other (comment)    Row Limitations  pink XTS    Shoulder Extension  Strengthening;Both;20 reps;10 reps    Theraband Level  (Shoulder Extension)  Other (comment)    Shoulder Extension Limitations  pink XTS    Other Standing Lumbar Exercises  wall push ups 2x10      Lumbar Exercises: Supine   Bridge with clamshell  20 reps   red t-band   Other Supine Lumbar Exercises  bil leg lowering 2x10      Moist Heat Therapy   Number Minutes Moist Heat  15 Minutes    Moist Heat Location  Lumbar Spine      Electrical Stimulation   Electrical Stimulation Location  B lumbar paraspinals     Electrical Stimulation Action  IFC    Electrical Stimulation Parameters  80-150hz  x25min    Electrical Stimulation Goals  Pain               PT Short Term Goals - 01/31/18 1753      PT SHORT TERM GOAL #1   Title  STG=LTG        PT Long Term Goals - 02/06/18 1623      PT LONG TERM GOAL #1  Title  Patient will be independent with HEP and its progression    Time  6    Period  Weeks    Status  Achieved      PT LONG TERM GOAL #2   Title  Patient will demonstrate 4+/5 or greater LE strength bilaterally to improve stability during functional tasks    Time  6    Period  Weeks    Status  On-going      PT LONG TERM GOAL #3   Title  Patient will demonstrate proper squat and lifting mechanics to protect back during functional tasks and lifting child.    Time  6    Period  Weeks    Status  On-going      PT LONG TERM GOAL #4   Title  Patient will report ability to perform ADLs and child care duties with low back pain less than or equal to 3/10.    Time  6    Period  Weeks    Status  On-going            Plan - 02/20/18 1512    Clinical Impression Statement  Patient tolerated treatment well today and progressing with core activation activities. Patient has ongoing pain reported in low back with certain movements such as lifting and bending. Patient goals ongoing at this time due to pain deficts.     Rehab Potential  Excellent    PT Frequency  2x / week    PT Duration  6 weeks    PT Treatment/Interventions   Passive range of motion;Moist Heat;Ultrasound;Cryotherapy;Electrical Stimulation;Therapeutic exercise;Therapeutic activities;Functional mobility training;Stair training;Gait training;Neuromuscular re-education;Manual techniques;Patient/family education    PT Next Visit Plan  cont with bike, core strengthening, LE strengthening, e-stim and moist heat for pain relief    Consulted and Agree with Plan of Care  Patient       Patient will benefit from skilled therapeutic intervention in order to improve the following deficits and impairments:  Pain, Decreased activity tolerance, Decreased range of motion, Decreased strength, Postural dysfunction  Visit Diagnosis: Chronic midline low back pain, unspecified whether sciatica present  Muscle weakness (generalized)     Problem List Patient Active Problem List   Diagnosis Date Noted  . Chronic midline low back pain with left-sided sciatica 01/12/2018  . BMI 29.0-29.9,adult 01/12/2018    DUNFORD, CHRISTINA P, PTA 02/20/2018, 3:42 PM  [facility] [place], [place], [address] Phone: [phone]   Fax:  [phone]  Name: Katrina Lin MRN: [id number] Date of Birth: 1995-07-09

## 2018-02-21 ENCOUNTER — Encounter: Payer: Self-pay | Admitting: Physical Therapy

## 2018-02-21 ENCOUNTER — Ambulatory Visit: Payer: 59 | Admitting: Physical Therapy

## 2018-02-21 DIAGNOSIS — M545 Low back pain, unspecified: Secondary | ICD-10-CM

## 2018-02-21 DIAGNOSIS — G8929 Other chronic pain: Secondary | ICD-10-CM

## 2018-02-21 DIAGNOSIS — M6281 Muscle weakness (generalized): Secondary | ICD-10-CM

## 2018-02-21 NOTE — Therapy (Signed)
[facility] [place], [place], [address] Phone: [phone]   Fax:  [phone]  Physical Therapy Treatment  Patient Details  Name: Katrina Lin MRN: [id number] Date of Birth: [birth year] Referring Provider (PT): Darla Lesches, FNP   Encounter Date: 02/21/2018  PT End of Session - 02/21/18 1436    Visit Number  7    Number of Visits  12    Date for PT Re-Evaluation  03/21/18    Authorization Type  Progress note every 10th visit    PT Start Time  1433    PT Stop Time  1522    PT Time Calculation (min)  49 min    Activity Tolerance  Patient tolerated treatment well    Behavior During Therapy  [facility] for tasks assessed/performed       Past Medical History:  Diagnosis Date  . Anxiety   . Panic attacks     History reviewed. No pertinent surgical history.  There were no vitals filed for this visit.  Subjective Assessment - 02/21/18 1429    Subjective  Reports some low back discomfort today. Reports being able to pick up her daugter with less pain now.    Limitations  House hold activities;Standing;Walking    How long can you stand comfortably?  <25 minutes    Diagnostic tests  X-Ray, MRI: normal    Patient Stated Goals  improve pain improve mechanics    Currently in Pain?  Yes    Pain Score  9     Pain Location  Back    Pain Orientation  Lower    Pain Descriptors / Indicators  Discomfort    Pain Type  Chronic pain    Pain Onset  More than a month ago         [facility] PT Assessment - 02/21/18 0001      Assessment   Medical Diagnosis  Chronic low back pain with left sided sciaticia    Referring Provider (PT)  Darla Lesches, FNP    Next MD Visit  N/A    Prior Therapy  yes, in [place]      Precautions   Precautions  None      Restrictions   Weight Bearing Restrictions  No                   [facility] Adult PT Treatment/Exercise - 02/21/18 0001      Therapeutic Activites    Therapeutic Activities   Lifting    Lifting  Lifting/squatting techinque instruction with 14# box      Lumbar Exercises: Aerobic   Stationary Bike  L3 x10 min    UBE (Upper Arm Bike)  120 RPM x6 min forward/backward      Lumbar Exercises: Standing   Wall Slides  20 reps;5 seconds   with draw in   Row  Strengthening;Both;20 reps;Limitations    Row Limitations  Pink XTS    Shoulder Extension  Strengthening;Both;20 reps;Limitations    Shoulder Extension Limitations  Pink XTS    Other Standing Lumbar Exercises  wall push ups 2x10      Lumbar Exercises: Supine   Bridge with clamshell  20 reps   Green theraband   Other Supine Lumbar Exercises  Supine bicycle x20 reps      Modalities   Modalities  Electrical Stimulation;Moist Heat      Moist Heat Therapy   Number Minutes Moist Heat  10 Minutes    Moist Heat Location  Lumbar Spine      Electrical Stimulation   Electrical Stimulation Location  B lumbar paraspinals     Electrical Stimulation Action  IFC    Electrical Stimulation Parameters  80-150 hz x10 min    Electrical Stimulation Goals  Pain               PT Short Term Goals - 01/31/18 1753      PT SHORT TERM GOAL #1   Title  STG=LTG        PT Long Term Goals - 02/06/18 1623      PT LONG TERM GOAL #1   Title  Patient will be independent with HEP and its progression    Time  6    Period  Weeks    Status  Achieved      PT LONG TERM GOAL #2   Title  Patient will demonstrate 4+/5 or greater LE strength bilaterally to improve stability during functional tasks    Time  6    Period  Weeks    Status  On-going      PT LONG TERM GOAL #3   Title  Patient will demonstrate proper squat and lifting mechanics to protect back during functional tasks and lifting child.    Time  6    Period  Weeks    Status  On-going      PT LONG TERM GOAL #4   Title  Patient will report ability to perform ADLs and child care duties with low back pain less than or equal to 3/10.    Time  6    Period  Weeks     Status  On-going            Plan - 02/21/18 1525    Clinical Impression Statement  Patient tolerated today's treatment well although reporting 9/10 LBP. Patient able to complete all exercises well with no complaints of any increased pain. Postural demo and VCs were provided throughout treatment. Squat and lifting technique instructed today due to demo initial of wall squats by patient. Patient required multiple reps of demo to be able to complete the proper technique and may require further education. Normal modalities response noted following removal of the modalities.    Rehab Potential  Excellent    PT Frequency  2x / week    PT Duration  6 weeks    PT Treatment/Interventions  Passive range of motion;Moist Heat;Ultrasound;Cryotherapy;Electrical Stimulation;Therapeutic exercise;Therapeutic activities;Functional mobility training;Stair training;Gait training;Neuromuscular re-education;Manual techniques;Patient/family education    PT Next Visit Plan  cont with bike, core strengthening, LE strengthening, e-stim and moist heat for pain relief    PT Home Exercise Plan  see patient education section     Consulted and Agree with Plan of Care  Patient       Patient will benefit from skilled therapeutic intervention in order to improve the following deficits and impairments:  Pain, Decreased activity tolerance, Decreased range of motion, Decreased strength, Postural dysfunction  Visit Diagnosis: Chronic midline low back pain, unspecified whether sciatica present  Muscle weakness (generalized)     Problem List Patient Active Problem List   Diagnosis Date Noted  . Chronic midline low back pain with left-sided sciatica 01/12/2018  . BMI 29.0-29.9,adult 01/12/2018    Standley Brooking, PTA 02/21/2018, 3:33 PM  [facility] [address] [place], [place], [address] Phone: [phone]   Fax:  [phone]  Name: Katrina Lin MRN:  [id number] Date of Birth: 1995-10-09

## 2018-02-27 ENCOUNTER — Ambulatory Visit: Payer: 59 | Admitting: Physical Therapy

## 2018-02-27 ENCOUNTER — Encounter: Payer: Self-pay | Admitting: Physical Therapy

## 2018-02-27 DIAGNOSIS — M6281 Muscle weakness (generalized): Secondary | ICD-10-CM

## 2018-02-27 DIAGNOSIS — M545 Low back pain, unspecified: Secondary | ICD-10-CM

## 2018-02-27 DIAGNOSIS — G8929 Other chronic pain: Secondary | ICD-10-CM

## 2018-02-27 NOTE — Therapy (Signed)
[facility] [place], [place], [address] Phone: [phone]   Fax:  [phone]  Physical Therapy Treatment  Patient Details  Name: Katrina Lin MRN: [id number] Date of Birth: 09/23/1995 Referring Provider (PT): Darla Lesches, FNP   Encounter Date: 02/27/2018  PT End of Session - 02/27/18 1450    Visit Number  8    Number of Visits  12    Date for PT Re-Evaluation  03/21/18    Authorization Type  Progress note every 10th visit    PT Start Time  1430    PT Stop Time  1520    PT Time Calculation (min)  50 min    Activity Tolerance  Patient tolerated treatment well    Behavior During Therapy  [facility] for tasks assessed/performed       Past Medical History:  Diagnosis Date  . Anxiety   . Panic attacks     History reviewed. No pertinent surgical history.  There were no vitals filed for this visit.  Subjective Assessment - 02/27/18 1446    Subjective  "I'm feeling good, I feel like this is really helping."    Limitations  House hold activities;Standing;Walking    How long can you stand comfortably?  <25 minutes    Diagnostic tests  X-Ray, MRI: normal    Patient Stated Goals  improve pain improve mechanics    Currently in Pain?  Yes    Pain Score  3     Pain Orientation  Lower    Pain Descriptors / Indicators  Discomfort    Pain Type  Chronic pain    Pain Onset  More than a month ago    Pain Frequency  Intermittent                       [facility] Adult PT Treatment/Exercise - 02/27/18 0001      Lumbar Exercises: Aerobic   Nustep  Level 5 x15 mins UE/LE with draw in      Lumbar Exercises: Machines for Strengthening   Other Lumbar Machine Exercise  Lumbar flexion 40# 2x10    Other Lumbar Machine Exercise  Lumbar extension 40# 2x10      Lumbar Exercises: Standing   Wall Slides  20 reps;5 seconds   with draw i   Row  Strengthening;Both;20 reps;Limitations    Row Limitations  Pink XTS    Shoulder  Extension  Strengthening;Both;20 reps;Limitations    Shoulder Extension Limitations  pink XTS    Other Standing Lumbar Exercises  lat pull downs 2x10; wood chops 2x10 each      Modalities   Modalities  Electrical Stimulation;Moist Heat      Moist Heat Therapy   Number Minutes Moist Heat  10 Minutes    Moist Heat Location  Lumbar Spine      Electrical Stimulation   Electrical Stimulation Location  B lumbar paraspinals     Electrical Stimulation Action  IFC    Electrical Stimulation Parameters  80-150 hz x10 min    Electrical Stimulation Goals  Pain               PT Short Term Goals - 01/31/18 1753      PT SHORT TERM GOAL #1   Title  STG=LTG        PT Long Term Goals - 02/06/18 1623      PT LONG TERM GOAL #1   Title  Patient will be independent with HEP and  its progression    Time  6    Period  Weeks    Status  Achieved      PT LONG TERM GOAL #2   Title  Patient will demonstrate 4+/5 or greater LE strength bilaterally to improve stability during functional tasks    Time  6    Period  Weeks    Status  On-going      PT LONG TERM GOAL #3   Title  Patient will demonstrate proper squat and lifting mechanics to protect back during functional tasks and lifting child.    Time  6    Period  Weeks    Status  On-going      PT LONG TERM GOAL #4   Title  Patient will report ability to perform ADLs and child care duties with low back pain less than or equal to 3/10.    Time  6    Period  Weeks    Status  On-going            Plan - 02/27/18 1530    Clinical Impression Statement  Patient was able to tolerate treatment well as well as tolerate progression of exercises. Patient denied any increase of pain but did notice muscle weakness with lumbar machines. Patient was able to demonstrate good form with wall squats. No adverse affects noted upon removal of modalities.     Clinical Presentation  Stable    Clinical Decision Making  Low    Rehab Potential  Excellent     PT Frequency  2x / week    PT Duration  6 weeks    PT Treatment/Interventions  Passive range of motion;Moist Heat;Ultrasound;Cryotherapy;Electrical Stimulation;Therapeutic exercise;Therapeutic activities;Functional mobility training;Stair training;Gait training;Neuromuscular re-education;Manual techniques;Patient/family education    PT Next Visit Plan  cont with bike, core strengthening, LE strengthening, e-stim and moist heat for pain relief    Consulted and Agree with Plan of Care  Patient       Patient will benefit from skilled therapeutic intervention in order to improve the following deficits and impairments:  Pain, Decreased activity tolerance, Decreased range of motion, Decreased strength, Postural dysfunction  Visit Diagnosis: Chronic midline low back pain, unspecified whether sciatica present  Muscle weakness (generalized)     Problem List Patient Active Problem List   Diagnosis Date Noted  . Chronic midline low back pain with left-sided sciatica 01/12/2018  . BMI 29.0-29.9,adult 01/12/2018   Gabriela Eves, PT, DPT 02/27/2018, 3:35 PM  [facility] [address] [place], [place], [address] Phone: [phone]   Fax:  [phone]  Name: Katrina Lin MRN: [id number] Date of Birth: Aug 11, 1995

## 2018-03-09 ENCOUNTER — Encounter: Payer: Self-pay | Admitting: Family Medicine

## 2018-03-09 ENCOUNTER — Ambulatory Visit: Payer: BLUE CROSS/BLUE SHIELD | Attending: Family Medicine | Admitting: Physical Therapy

## 2018-03-09 ENCOUNTER — Encounter: Payer: Self-pay | Admitting: Physical Therapy

## 2018-03-09 ENCOUNTER — Ambulatory Visit: Payer: BLUE CROSS/BLUE SHIELD | Admitting: Family Medicine

## 2018-03-09 VITALS — BP 139/87 | HR 85 | Temp 97.5°F | Ht 61.0 in | Wt 149.6 lb

## 2018-03-09 DIAGNOSIS — G8929 Other chronic pain: Secondary | ICD-10-CM | POA: Insufficient documentation

## 2018-03-09 DIAGNOSIS — M6281 Muscle weakness (generalized): Secondary | ICD-10-CM | POA: Insufficient documentation

## 2018-03-09 DIAGNOSIS — R590 Localized enlarged lymph nodes: Secondary | ICD-10-CM

## 2018-03-09 DIAGNOSIS — M545 Low back pain: Secondary | ICD-10-CM | POA: Insufficient documentation

## 2018-03-09 NOTE — Therapy (Addendum)
[facility] [place], [place], [address] Phone: [phone]   Fax:  [phone]  Physical Therapy Treatment  Patient Details  Name: Katrina Lin MRN: [id number] Date of Birth: 1995-03-29 Referring Provider (PT): Darla Lesches, FNP   Encounter Date: 03/09/2018  PT End of Session - 03/09/18 1445    Visit Number  9    Number of Visits  12    Date for PT Re-Evaluation  03/21/18    Authorization Type  Progress note every 10th visit    PT Start Time  1434    PT Stop Time  1513    PT Time Calculation (min)  39 min    Activity Tolerance  Patient tolerated treatment well    Behavior During Therapy  [facility] for tasks assessed/performed       Past Medical History:  Diagnosis Date  . Anxiety   . Panic attacks     History reviewed. No pertinent surgical history.  There were no vitals filed for this visit.  Subjective Assessment - 03/09/18 1435    Subjective  Reports having some increased LBP recently but currently have UTI and taking antibiotics.    Limitations  House hold activities;Standing;Walking    How long can you stand comfortably?  <25 minutes    Diagnostic tests  X-Ray, MRI: normal    Patient Stated Goals  improve pain improve mechanics    Currently in Pain?  Yes    Pain Score  5     Pain Location  Back    Pain Orientation  Lower    Pain Descriptors / Indicators  Discomfort    Pain Type  Chronic pain    Pain Onset  More than a month ago         [facility] PT Assessment - 03/09/18 0001      Assessment   Medical Diagnosis  Chronic low back pain with left sided sciaticia    Referring Provider (PT)  Darla Lesches, FNP    Next MD Visit  N/A    Prior Therapy  yes, in [place]      Precautions   Precautions  None      Restrictions   Weight Bearing Restrictions  No                   [facility] Adult PT Treatment/Exercise - 03/09/18 0001      Lumbar Exercises: Aerobic   Nustep  Level 5 x10 mins UE/LE  with draw in      Lumbar Exercises: Machines for Strengthening   Cybex Lumbar Extension  50# 2x10 reps    Other Lumbar Machine Exercise  Lumbar flexion 50# 2x10      Lumbar Exercises: Standing   Wall Slides  20 reps;5 seconds   VCs and demo for proper wall squat technique   Row  Strengthening;Both;20 reps;Limitations    Row Limitations  Pink XTS    Shoulder Extension  Strengthening;Both;20 reps;Limitations    Shoulder Extension Limitations  Pink XTS    Other Standing Lumbar Exercises  B chop wood Pink XTS x20 reps each      Modalities   Modalities  Electrical Stimulation;Moist Heat      Moist Heat Therapy   Number Minutes Moist Heat  10 Minutes    Moist Heat Location  Lumbar Spine      Electrical Stimulation   Electrical Stimulation Location  B lumbar paraspinals     Electrical Stimulation Action  Pre-Mod  Electrical Stimulation Parameters  80-150 hz x10 min    Electrical Stimulation Goals  Pain               PT Short Term Goals - 01/31/18 1753      PT SHORT TERM GOAL #1   Title  STG=LTG        PT Long Term Goals - 02/06/18 1623      PT LONG TERM GOAL #1   Title  Patient will be independent with HEP and its progression    Time  6    Period  Weeks    Status  Achieved      PT LONG TERM GOAL #2   Title  Patient will demonstrate 4+/5 or greater LE strength bilaterally to improve stability during functional tasks    Time  6    Period  Weeks    Status  On-going      PT LONG TERM GOAL #3   Title  Patient will demonstrate proper squat and lifting mechanics to protect back during functional tasks and lifting child.    Time  6    Period  Weeks    Status  On-going      PT LONG TERM GOAL #4   Title  Patient will report ability to perform ADLs and child care duties with low back pain less than or equal to 3/10.    Time  6    Period  Weeks    Status  On-going            Plan - 03/09/18 1506    Clinical Impression Statement  Patient presented in  clinic with reports of recent increased LBP which she thinks may be correlated to being treated for UTI. Patient able to complete all exercises well with VCs for core draw in as well VCs and demo to ensure proper squat technique. No complaints of any pain during therex session. Normal modalities response noted following removal of the modalities.    Rehab Potential  Excellent    PT Frequency  2x / week    PT Duration  6 weeks    PT Treatment/Interventions  Passive range of motion;Moist Heat;Ultrasound;Cryotherapy;Electrical Stimulation;Therapeutic exercise;Therapeutic activities;Functional mobility training;Stair training;Gait training;Neuromuscular re-education;Manual techniques;Patient/family education    PT Next Visit Plan  cont with bike, core strengthening, LE strengthening, e-stim and moist heat for pain relief    PT Home Exercise Plan  see patient education section     Consulted and Agree with Plan of Care  Patient       Patient will benefit from skilled therapeutic intervention in order to improve the following deficits and impairments:  Pain, Decreased activity tolerance, Decreased range of motion, Decreased strength, Postural dysfunction  Visit Diagnosis: Chronic midline low back pain, unspecified whether sciatica present  Muscle weakness (generalized)     Problem List Patient Active Problem List   Diagnosis Date Noted  . Chronic midline low back pain with left-sided sciatica 01/12/2018  . BMI 29.0-29.9,adult 01/12/2018    Standley Brooking, PTA 03/09/2018, 3:16 PM  [facility] [address] [place], [place], [address] Phone: [phone]   Fax:  [phone]  Name: Katrina Lin MRN: [id number] Date of Birth: 10-26-1995

## 2018-03-09 NOTE — Progress Notes (Signed)
BP 139/87   Pulse 85   Temp (!) 97.5 F (36.4 C) (Oral)   Ht 5\' 1"  (1.549 m)   Wt 149 lb 9.6 oz (67.9 kg)   BMI 28.27 kg/m    Subjective:    Patient ID: Katrina Lin, female    DOB: 08-17-1995, 23 y.o.   MRN: [id number]  HPI: Katrina Lin is a 22 y.o. female presenting on 03/09/2018 for lump on right side of neck (Patient states she noticed it x 3 days ago and the swelling has gone down. States it is painful when you push on it.)   HPI Swelling and lump on the right side of neck Patient is coming in with complaints of swelling and a lump on the right side of her neck just below her jawline that she noticed 3 days ago.  She says it has been quite tender but it has come down in size and swelling but is still slightly there and is still tender.  She is coming in to see what it could be and make sure that things are okay.  She denies any fevers or chills.  She has had some allergy-like symptoms and congestion and has been using her Claritin.  She is also been on an antibiotic for UTI currently.  Relevant past medical, surgical, family and social history reviewed and updated as indicated. Interim medical history since our last visit reviewed. Allergies and medications reviewed and updated.  Review of Systems  Constitutional: Negative for chills and fever.  HENT: Positive for congestion. Negative for ear discharge and ear pain.   Eyes: Negative for redness and visual disturbance.  Respiratory: Positive for cough. Negative for chest tightness and shortness of breath.   Cardiovascular: Negative for chest pain and leg swelling.  Genitourinary: Negative for difficulty urinating and dysuria.  Musculoskeletal: Negative for back pain and gait problem.  Skin: Negative for rash.  Neurological: Negative for light-headedness and headaches.  Psychiatric/Behavioral: Negative for agitation and behavioral problems.  All other systems reviewed and are negative.   Per HPI unless specifically  indicated above   Allergies as of 03/09/2018   No Known Allergies     Medication List       Accurate as of March 09, 2018  4:11 PM. Always use your most recent med list.        naproxen 500 MG tablet Commonly known as:  NAPROSYN Take 1 tablet (500 mg total) by mouth 2 (two) times daily with a meal.   nitrofurantoin (macrocrystal-monohydrate) 100 MG capsule Commonly known as:  MACROBID Take by mouth.   Norethin Ace-Eth Estrad-FE 1-20 MG-MCG(24) Chew Commonly known as:  MINASTRIN 24 FE Chew 1 tablet by mouth daily. Dispense brand name please          Objective:    BP 139/87   Pulse 85   Temp (!) 97.5 F (36.4 C) (Oral)   Ht 5\' 1"  (1.549 m)   Wt 149 lb 9.6 oz (67.9 kg)   BMI 28.27 kg/m   Wt Readings from Last 3 Encounters:  03/09/18 149 lb 9.6 oz (67.9 kg)  01/12/18 152 lb (68.9 kg)    Physical Exam Vitals signs and nursing note reviewed.  Constitutional:      General: She is not in acute distress.    Appearance: She is well-developed. She is not diaphoretic.  HENT:     Right Ear: Tympanic membrane, ear canal and external ear normal. There is no impacted cerumen.     Left  Ear: Ear canal and external ear normal. There is no impacted cerumen.     Nose: Nose normal. No congestion or rhinorrhea.     Mouth/Throat:     Mouth: Mucous membranes are moist.     Pharynx: Oropharynx is clear. No posterior oropharyngeal erythema.  Eyes:     Conjunctiva/sclera: Conjunctivae normal.  Cardiovascular:     Rate and Rhythm: Normal rate and regular rhythm.     Heart sounds: Normal heart sounds. No murmur.  Pulmonary:     Effort: Pulmonary effort is normal. No respiratory distress.     Breath sounds: Normal breath sounds. No wheezing.  Musculoskeletal: Normal range of motion.        General: No tenderness.  Lymphadenopathy:     Cervical: Cervical adenopathy (Small mobile and tender right anterior cervical lymph node) present.  Skin:    General: Skin is warm and dry.      Findings: No rash.  Neurological:     Mental Status: She is alert and oriented to person, place, and time.     Coordination: Coordination normal.  Psychiatric:        Behavior: Behavior normal.         Assessment & Plan:   Problem List Items Addressed This Visit    None    Visit Diagnoses    Anterior cervical lymphadenopathy    -  Primary   Single tender right anterior cervical lymph node, been there 3 days, observe      Gave reassurance and return if not gone in a couple months. Follow up plan: Return if symptoms worsen or fail to improve.  Counseling provided for all of the vaccine components No orders of the defined types were placed in this encounter.   Caryl Pina, MD [facility] 03/09/2018, 4:11 PM

## 2018-03-13 ENCOUNTER — Encounter: Payer: Self-pay | Admitting: Physical Therapy

## 2018-03-13 ENCOUNTER — Ambulatory Visit: Payer: BLUE CROSS/BLUE SHIELD | Admitting: Physical Therapy

## 2018-03-13 DIAGNOSIS — M545 Low back pain, unspecified: Secondary | ICD-10-CM

## 2018-03-13 DIAGNOSIS — G8929 Other chronic pain: Secondary | ICD-10-CM

## 2018-03-13 DIAGNOSIS — M6281 Muscle weakness (generalized): Secondary | ICD-10-CM

## 2018-03-13 NOTE — Therapy (Addendum)
[facility] [place], [place], [address] Phone: [phone]   Fax:  [phone]  Physical Therapy Treatment  Progress Note Reporting Period 01/31/18 to 16/2020  See note below for Objective Data and Assessment of Progress/Goals.      Patient Details  Name: Katrina Lin MRN: [id number] Date of Birth: 1996-02-03 Referring Provider (PT): Darla Lesches, FNP   Encounter Date: 03/13/2018  PT End of Session - 03/13/18 1500    Visit Number  10    Number of Visits  12    Date for PT Re-Evaluation  03/21/18    Authorization Type  Progress note every 10th visit    PT Start Time  1428    PT Stop Time  1513    PT Time Calculation (min)  45 min    Activity Tolerance  Patient tolerated treatment well    Behavior During Therapy  [facility] for tasks assessed/performed       Past Medical History:  Diagnosis Date  . Anxiety   . Panic attacks     History reviewed. No pertinent surgical history.  There were no vitals filed for this visit.  Subjective Assessment - 03/13/18 1437    Subjective  Patient arrived with increased pain may be due to lifting daughter    Limitations  House hold activities;Standing;Walking    How long can you stand comfortably?  <25 minutes    Diagnostic tests  X-Ray, MRI: normal    Patient Stated Goals  improve pain improve mechanics    Currently in Pain?  Yes    Pain Score  6     Pain Location  Back    Pain Orientation  Lower    Pain Descriptors / Indicators  Discomfort    Pain Type  Chronic pain    Pain Onset  More than a month ago    Pain Frequency  Intermittent    Aggravating Factors   lifting    Pain Relieving Factors  at rest                       [facility] Adult PT Treatment/Exercise - 03/13/18 0001      Lumbar Exercises: Aerobic   Nustep  Level 5 x10 mins UE/LE with draw in      Lumbar Exercises: Machines for Strengthening   Cybex Lumbar Extension  50# 2x10 reps    Other  Lumbar Machine Exercise  Lumbar flexion 50# 2x10      Lumbar Exercises: Standing   Wall Slides  20 reps;5 seconds    Row  Strengthening;Both;20 reps;Limitations;10 reps    Row Limitations  pink XTS    Shoulder Extension  Strengthening;Both;20 reps;Limitations;10 reps    Shoulder Extension Limitations  pink XTS standing on airex    Other Standing Lumbar Exercises  B chop wood Pink XTS x20 reps each      Moist Heat Therapy   Number Minutes Moist Heat  15 Minutes    Moist Heat Location  Lumbar Spine      Electrical Stimulation   Electrical Stimulation Location  B lumbar paraspinals     Electrical Stimulation Action  IFC    Electrical Stimulation Parameters  80-150hz  x66min    Electrical Stimulation Goals  Pain               PT Short Term Goals - 01/31/18 1753      PT SHORT TERM GOAL #1   Title  STG=LTG  PT Long Term Goals - 03/13/18 1501      PT LONG TERM GOAL #1   Title  Patient will be independent with HEP and its progression    Time  6    Period  Weeks    Status  Achieved      PT LONG TERM GOAL #2   Title  Patient will demonstrate 4+/5 or greater LE strength bilaterally to improve stability during functional tasks    Time  6    Period  Weeks    Status  On-going   NT 03/13/18     PT LONG TERM GOAL #3   Title  Patient will demonstrate proper squat and lifting mechanics to protect back during functional tasks and lifting child.    Time  6    Period  Weeks    Status  On-going   need to work on due to compensation with proper technique 03/13/18     PT LONG TERM GOAL #4   Title  Patient will report ability to perform ADLs and child care duties with low back pain less than or equal to 3/10.    Time  6    Period  Weeks    Status  On-going   up to 6/10 03/13/18           Plan - 03/13/18 1503    Clinical Impression Statement  Patient tolerated treatment fair today. patient reported 6/10 pain after lifting her daughter more. Today focused on same  exercises due to limitations. Patient still has compensations with exercises technique and with proper lifting/squatting technique. Current goals ongoing.     Rehab Potential  Excellent    PT Frequency  2x / week    PT Duration  6 weeks    PT Treatment/Interventions  Passive range of motion;Moist Heat;Ultrasound;Cryotherapy;Electrical Stimulation;Therapeutic exercise;Therapeutic activities;Functional mobility training;Stair training;Gait training;Neuromuscular re-education;Manual techniques;Patient/family education    PT Next Visit Plan  cont with bike, core strengthening, LE strengthening, and progress as able, e-stim and moist heat for pain relief    Consulted and Agree with Plan of Care  Patient       Patient will benefit from skilled therapeutic intervention in order to improve the following deficits and impairments:  Pain, Decreased activity tolerance, Decreased range of motion, Decreased strength, Postural dysfunction  Visit Diagnosis: Chronic midline low back pain, unspecified whether sciatica present  Muscle weakness (generalized)     Problem List Patient Active Problem List   Diagnosis Date Noted  . Chronic midline low back pain with left-sided sciatica 01/12/2018  . BMI 29.0-29.9,adult 01/12/2018   Ladean Raya, PTA 03/13/18 3:14 PM  [facility] [place], [place], [address] Phone: [phone]   Fax:  [phone]  Name: Katrina Lin MRN: [id number] Date of Birth: [birth year]

## 2018-03-16 ENCOUNTER — Encounter: Payer: Self-pay | Admitting: Physical Therapy

## 2018-04-04 ENCOUNTER — Ambulatory Visit: Payer: BLUE CROSS/BLUE SHIELD | Admitting: Physical Therapy

## 2018-04-04 ENCOUNTER — Encounter: Payer: Self-pay | Admitting: Physical Therapy

## 2018-04-04 DIAGNOSIS — M545 Low back pain, unspecified: Secondary | ICD-10-CM

## 2018-04-04 DIAGNOSIS — G8929 Other chronic pain: Secondary | ICD-10-CM

## 2018-04-04 DIAGNOSIS — M6281 Muscle weakness (generalized): Secondary | ICD-10-CM

## 2018-04-04 NOTE — Therapy (Signed)
[facility] [place], [place], [address] Phone: [phone]   Fax:  [phone]  Physical Therapy Treatment  Patient Details  Name: Katrina Lin MRN: [id number] Date of Birth: 1995-03-30 Referring Provider (PT): Darla Lesches, FNP   Encounter Date: 04/04/2018  PT End of Session - 04/04/18 1525    Visit Number  11    Number of Visits  18    Date for PT Re-Evaluation  04/28/18    Authorization Type  Progress note every 10th visit    PT Start Time  1515    PT Stop Time  1620    PT Time Calculation (min)  65 min    Activity Tolerance  Patient tolerated treatment well    Behavior During Therapy  [facility] for tasks assessed/performed       Past Medical History:  Diagnosis Date  . Anxiety   . Panic attacks     History reviewed. No pertinent surgical history.  There were no vitals filed for this visit.  Subjective Assessment - 04/04/18 1525    Subjective  Reports still having some LBP intermittantly lately. Reports that she feels as if she is improving but states that would like more visits.    Limitations  House hold activities;Standing;Walking    How long can you stand comfortably?  <25 minutes    Diagnostic tests  X-Ray, MRI: normal    Patient Stated Goals  improve pain improve mechanics    Currently in Pain?  Yes    Pain Score  6     Pain Location  Back    Pain Orientation  Lower    Pain Descriptors / Indicators  Discomfort    Pain Type  Chronic pain    Pain Onset  More than a month ago    Pain Frequency  Intermittent         [facility] PT Assessment - 04/04/18 0001      Assessment   Medical Diagnosis  Chronic low back pain with left sided sciaticia    Referring Provider (PT)  Darla Lesches, FNP    Next MD Visit  N/A    Prior Therapy  yes, in [place]      Precautions   Precautions  None      Restrictions   Weight Bearing Restrictions  No      ROM / Strength   AROM / PROM / Strength  Strength      AROM   Overall AROM   --    AROM Assessment Site  --    Right/Left Hip  --      Strength   Overall Strength  Deficits;Within functional limits for tasks performed    Strength Assessment Site  Hip    Right/Left Hip  Right;Left    Right Hip Flexion  4/5    Right Hip Extension  4/5    Right Hip ABduction  4+/5    Left Hip Flexion  4+/5    Left Hip Extension  4/5    Left Hip ABduction  4+/5                   [facility] Adult PT Treatment/Exercise - 04/04/18 0001      Lumbar Exercises: Aerobic   Nustep  L4 x15 min      Lumbar Exercises: Machines for Strengthening   Cybex Lumbar Extension  60# 3x10 reps    Other Lumbar Machine Exercise  Lumbar flexion 60# 3x10  Lumbar Exercises: Standing   Wall Slides  15 reps;4 seconds    Row  Strengthening;Both;20 reps;Limitations;10 reps    Row Limitations  Pink XTS    Shoulder Extension  Strengthening;Both;20 reps;10 reps    Shoulder Extension Limitations  Pink XTS    Other Standing Lumbar Exercises  B chop wood Pink XTS x20 reps each      Lumbar Exercises: Supine   Bridge with March  10 reps      Lumbar Exercises: Prone   Single Arm Raise  Right;Left;15 reps;3 seconds    Straight Leg Raise  15 reps;3 seconds    Opposite Arm/Leg Raise  Right arm/Left leg;Left arm/Right leg;10 reps    Other Prone Lumbar Exercises  Prone pillow squeeze x20 reps    Other Prone Lumbar Exercises  Prone W back x20 rpes      Modalities   Modalities  Electrical Stimulation;Moist Heat      Moist Heat Therapy   Number Minutes Moist Heat  10 Minutes    Moist Heat Location  Lumbar Spine      Electrical Stimulation   Electrical Stimulation Location  B lumbar paraspinals     Electrical Stimulation Action  Pre-Mod    Electrical Stimulation Parameters  80-150 hz x15 min    Electrical Stimulation Goals  Pain               PT Short Term Goals - 01/31/18 1753      PT SHORT TERM GOAL #1   Title  STG=LTG        PT Long Term Goals -  04/04/18 1526      PT LONG TERM GOAL #1   Title  Patient will be independent with HEP and its progression    Time  6    Period  Weeks    Status  Achieved      PT LONG TERM GOAL #2   Title  Patient will demonstrate 4+/5 or greater LE strength bilaterally to improve stability during functional tasks    Time  6    Period  Weeks    Status  Partially Met      PT LONG TERM GOAL #3   Title  Patient will demonstrate proper squat and lifting mechanics to protect back during functional tasks and lifting child.    Time  6    Period  Weeks    Status  Partially Met      PT LONG TERM GOAL #4   Title  Patient will report ability to perform ADLs and child care duties with low back pain less than or equal to 3/10.    Time  6    Period  Weeks    Status  Achieved            Plan - 04/04/18 1703    Clinical Impression Statement  Patient presented in clinic with 6/10 LBP. Patient able to complete more advanced machine strengthening and progressed for more postural and lumbar stabilization exercises in prone. Lack of lumbar stabilization noted with prone exercises. Greater weakness noted in RLE and especially with hip extensors. Improved squat and lifting technique but still minimal rounding in lumbar spine. Normal modalities response noted following removal of the modalities.    Rehab Potential  Excellent    PT Frequency  2x / week    PT Duration  6 weeks    PT Treatment/Interventions  Passive range of motion;Moist Heat;Ultrasound;Cryotherapy;Electrical Stimulation;Therapeutic exercise;Therapeutic activities;Functional mobility training;Stair training;Gait training;Neuromuscular re-education;Manual techniques;Patient/family  education    PT Next Visit Plan  Continue more lumbar stabilization and strengthening per POC.    PT Home Exercise Plan  see patient education section     Consulted and Agree with Plan of Care  Patient       Patient will benefit from skilled therapeutic intervention in  order to improve the following deficits and impairments:  Pain, Decreased activity tolerance, Decreased range of motion, Decreased strength, Postural dysfunction  Visit Diagnosis: Chronic midline low back pain, unspecified whether sciatica present  Muscle weakness (generalized)     Problem List Patient Active Problem List   Diagnosis Date Noted  . Chronic midline low back pain with left-sided sciatica 01/12/2018  . BMI 29.0-29.9,adult 01/12/2018    Standley Brooking, PTA 04/04/18 10:39 PM   [facility] [address] [place], [place], [address] Phone: [phone]   Fax:  [phone]  Name: Katrina Lin MRN: [id number] Date of Birth: Apr 17, 1995

## 2018-04-18 ENCOUNTER — Ambulatory Visit: Payer: BLUE CROSS/BLUE SHIELD | Attending: Family Medicine | Admitting: Physical Therapy

## 2018-04-18 ENCOUNTER — Encounter: Payer: Self-pay | Admitting: Physical Therapy

## 2018-04-18 DIAGNOSIS — M6281 Muscle weakness (generalized): Secondary | ICD-10-CM | POA: Diagnosis present

## 2018-04-18 DIAGNOSIS — M545 Low back pain, unspecified: Secondary | ICD-10-CM

## 2018-04-18 DIAGNOSIS — G8929 Other chronic pain: Secondary | ICD-10-CM

## 2018-04-18 NOTE — Therapy (Signed)
[facility] [place], [place], [address] Phone: [phone]   Fax:  [phone]  Physical Therapy Treatment  Patient Details  Name: Katrina Lin MRN: [id number] Date of Birth: [birth year] Referring Provider (PT): Darla Lesches, FNP   Encounter Date: 04/18/2018  PT End of Session - 04/18/18 1519    Visit Number  12    Number of Visits  18    Date for PT Re-Evaluation  04/28/18    Authorization Type  Progress note every 10th visit    PT Start Time  1518    PT Stop Time  1612    PT Time Calculation (min)  54 min    Activity Tolerance  Patient tolerated treatment well    Behavior During Therapy  [facility] for tasks assessed/performed       Past Medical History:  Diagnosis Date  . Anxiety   . Panic attacks     History reviewed. No pertinent surgical history.  There were no vitals filed for this visit.  Subjective Assessment - 04/18/18 1518    Subjective  Reports pain but correlates it to the rainy weather recently.    Limitations  House hold activities;Standing;Walking    How long can you stand comfortably?  <25 minutes    Diagnostic tests  X-Ray, MRI: normal    Patient Stated Goals  improve pain improve mechanics    Currently in Pain?  Yes    Pain Score  5     Pain Location  Back    Pain Orientation  Lower    Pain Descriptors / Indicators  Discomfort    Pain Type  Chronic pain    Pain Onset  More than a month ago    Pain Frequency  Intermittent         [facility] PT Assessment - 04/18/18 0001      Assessment   Medical Diagnosis  Chronic low back pain with left sided sciaticia    Referring Provider (PT)  Darla Lesches, FNP    Next MD Visit  N/A    Prior Therapy  yes, in [place]      Precautions   Precautions  None      Restrictions   Weight Bearing Restrictions  No                   [facility] Adult PT Treatment/Exercise - 04/18/18 0001      Lumbar Exercises: Aerobic   Nustep  L4 x15 min      Lumbar Exercises: Machines for Strengthening   Cybex Lumbar Extension  60# 3x10 reps    Other Lumbar Machine Exercise  Lumbar flexion 60# 3x10      Lumbar Exercises: Standing   Wall Slides  20 reps;5 seconds    Row  Strengthening;Both;20 reps;Limitations;10 reps    Row Limitations  Pink XTS    Shoulder Extension  Strengthening;Both;20 reps;10 reps    Shoulder Extension Limitations  Pink XTS    Other Standing Lumbar Exercises  B chop wood Pink XTS x20 reps each      Lumbar Exercises: Prone   Straight Leg Raise  15 reps;3 seconds               PT Short Term Goals - 01/31/18 1753      PT SHORT TERM GOAL #1   Title  STG=LTG        PT Long Term Goals - 04/04/18 1526      PT LONG TERM GOAL #  1   Title  Patient will be independent with HEP and its progression    Time  6    Period  Weeks    Status  Achieved      PT LONG TERM GOAL #2   Title  Patient will demonstrate 4+/5 or greater LE strength bilaterally to improve stability during functional tasks    Time  6    Period  Weeks    Status  Partially Met      PT LONG TERM GOAL #3   Title  Patient will demonstrate proper squat and lifting mechanics to protect back during functional tasks and lifting child.    Time  6    Period  Weeks    Status  Partially Met      PT LONG TERM GOAL #4   Title  Patient will report ability to perform ADLs and child care duties with low back pain less than or equal to 3/10.    Time  6    Period  Weeks    Status  Achieved            Plan - 04/18/18 1703    Clinical Impression Statement  Patient presented in clinic with reports of moderate LBP which she attributed to rainy weather. Patient guided through more advanced core and lumbar strengthening. Patient still requires VCs for lumbar stabilization in prone. No complaints of any increased LBP during any exercises today. Normal modalities response noted following removal of the modalities.    Rehab Potential  Excellent    PT  Frequency  2x / week    PT Duration  6 weeks    PT Treatment/Interventions  Passive range of motion;Moist Heat;Ultrasound;Cryotherapy;Electrical Stimulation;Therapeutic exercise;Therapeutic activities;Functional mobility training;Stair training;Gait training;Neuromuscular re-education;Manual techniques;Patient/family education    PT Next Visit Plan  Continue more lumbar stabilization and strengthening per POC.    PT Home Exercise Plan  see patient education section     Consulted and Agree with Plan of Care  Patient       Patient will benefit from skilled therapeutic intervention in order to improve the following deficits and impairments:  Pain, Decreased activity tolerance, Decreased range of motion, Decreased strength, Postural dysfunction  Visit Diagnosis: Chronic midline low back pain, unspecified whether sciatica present  Muscle weakness (generalized)     Problem List Patient Active Problem List   Diagnosis Date Noted  . Chronic midline low back pain with left-sided sciatica 01/12/2018  . BMI 29.0-29.9,adult 01/12/2018    Standley Brooking, PTA 04/18/2018, 5:07 PM  [facility] [address] [place], [place], [address] Phone: [phone]   Fax:  [phone]  Name: Katrina Lin MRN: [id number] Date of Birth: [birth year]

## 2018-04-25 ENCOUNTER — Encounter: Payer: Self-pay | Admitting: Physical Therapy

## 2018-04-25 ENCOUNTER — Ambulatory Visit: Payer: BLUE CROSS/BLUE SHIELD | Admitting: Physical Therapy

## 2018-04-25 DIAGNOSIS — G8929 Other chronic pain: Secondary | ICD-10-CM

## 2018-04-25 DIAGNOSIS — M6281 Muscle weakness (generalized): Secondary | ICD-10-CM

## 2018-04-25 DIAGNOSIS — M545 Low back pain, unspecified: Secondary | ICD-10-CM

## 2018-04-25 NOTE — Therapy (Addendum)
[facility] [place], [place], [address] Phone: [phone]   Fax:  [phone]  Physical Therapy Treatment  PHYSICAL THERAPY DISCHARGE SUMMARY  Visits from Start of Care: 13  Current functional level related to goals / functional outcomes: See below   Remaining deficits: See goals   Education / Equipment: HEP Plan: Patient agrees to discharge.  Patient goals were partially met. Patient is being discharged due to not returning since the last visit.  ?????   Gabriela Eves, PT, DPT 06/19/18  Patient Details  Name: Katrina Lin MRN: [id number] Date of Birth: 23-Feb-1996 Referring Provider (PT): Darla Lesches, FNP   Encounter Date: 04/25/2018  PT End of Session - 04/25/18 1532    Visit Number  13    Number of Visits  18    Date for PT Re-Evaluation  04/28/18    Authorization Type  Progress note every 10th visit    PT Start Time  1518    PT Stop Time  1608    PT Time Calculation (min)  50 min    Activity Tolerance  Patient tolerated treatment well    Behavior During Therapy  [facility] for tasks assessed/performed       Past Medical History:  Diagnosis Date  . Anxiety   . Panic attacks     History reviewed. No pertinent surgical history.  There were no vitals filed for this visit.  Subjective Assessment - 04/25/18 1521    Subjective  Reports her back is good today and not having much pain. Reports using squat technique to lift her daughter and more conscious of her posture.    Limitations  House hold activities;Standing;Walking    How long can you stand comfortably?  <25 minutes    Diagnostic tests  X-Ray, MRI: normal    Patient Stated Goals  improve pain improve mechanics    Currently in Pain?  Yes    Pain Score  3     Pain Location  Back    Pain Orientation  Lower    Pain Descriptors / Indicators  Discomfort    Pain Type  Chronic pain    Pain Onset  More than a month ago         [facility] PT  Assessment - 04/25/18 0001      Assessment   Medical Diagnosis  Chronic low back pain with left sided sciaticia    Referring Provider (PT)  Darla Lesches, FNP    Next MD Visit  N/A    Prior Therapy  yes, in [place]      Precautions   Precautions  None      Restrictions   Weight Bearing Restrictions  No                   [facility] Adult PT Treatment/Exercise - 04/25/18 0001      Lumbar Exercises: Aerobic   Stationary Bike  L3 x15 min      Lumbar Exercises: Machines for Strengthening   Cybex Lumbar Extension  70# x30 reps    Leg Press  2 pl, x20 reps with ball squeeze and core activation    Other Lumbar Machine Exercise  Lumbar flexion 70# 3x10      Lumbar Exercises: Standing   Wall Slides  15 reps;3 seconds   with core reachout   Shoulder Extension  Strengthening;Both;20 reps;10 reps    Shoulder Extension Limitations  Pink XTS    Other Standing Lumbar Exercises  B chop  wood Pink XTS x20 reps each      Modalities   Modalities  Electrical Stimulation;Moist Heat      Moist Heat Therapy   Number Minutes Moist Heat  15 Minutes    Moist Heat Location  Lumbar Spine      Electrical Stimulation   Electrical Stimulation Location  B lumbar paraspinals     Electrical Stimulation Action  Pre-Mod    Electrical Stimulation Parameters  80-150 hz x15 min    Electrical Stimulation Goals  Pain               PT Short Term Goals - 01/31/18 1753      PT SHORT TERM GOAL #1   Title  STG=LTG        PT Long Term Goals - 04/04/18 1526      PT LONG TERM GOAL #1   Title  Patient will be independent with HEP and its progression    Time  6    Period  Weeks    Status  Achieved      PT LONG TERM GOAL #2   Title  Patient will demonstrate 4+/5 or greater LE strength bilaterally to improve stability during functional tasks    Time  6    Period  Weeks    Status  Partially Met      PT LONG TERM GOAL #3   Title  Patient will demonstrate proper squat and lifting  mechanics to protect back during functional tasks and lifting child.    Time  6    Period  Weeks    Status  Partially Met      PT LONG TERM GOAL #4   Title  Patient will report ability to perform ADLs and child care duties with low back pain less than or equal to 3/10.    Time  6    Period  Weeks    Status  Achieved            Plan - 04/25/18 1559    Clinical Impression Statement  Patient presented in clinic with reports of low level LBP and more aware of her posture with ADLs and lifting her daughter. Patient progressed through more functional strengthening with LE inclusion. No complaints of pain reported by patient during today's treatment. Core activation VCs provided throughout therex. Normal modalities response noted following removal of the modalities.    Rehab Potential  Excellent    PT Frequency  2x / week    PT Duration  6 weeks    PT Treatment/Interventions  Passive range of motion;Moist Heat;Ultrasound;Cryotherapy;Electrical Stimulation;Therapeutic exercise;Therapeutic activities;Functional mobility training;Stair training;Gait training;Neuromuscular re-education;Manual techniques;Patient/family education    PT Next Visit Plan  Continue more lumbar stabilization and strengthening per POC.    PT Home Exercise Plan  see patient education section     Consulted and Agree with Plan of Care  Patient       Patient will benefit from skilled therapeutic intervention in order to improve the following deficits and impairments:  Pain, Decreased activity tolerance, Decreased range of motion, Decreased strength, Postural dysfunction  Visit Diagnosis: Chronic midline low back pain, unspecified whether sciatica present  Muscle weakness (generalized)     Problem List Patient Active Problem List   Diagnosis Date Noted  . Chronic midline low back pain with left-sided sciatica 01/12/2018  . BMI 29.0-29.9,adult 01/12/2018    Standley Brooking, PTA 04/25/2018, 4:11 PM  [facility] [place],  [place], [address] Phone: [phone]   Fax:  [phone]  Name: Katrina Lin MRN: [id number] Date of Birth: [birth year]

## 2018-06-27 ENCOUNTER — Ambulatory Visit (INDEPENDENT_AMBULATORY_CARE_PROVIDER_SITE_OTHER): Payer: BLUE CROSS/BLUE SHIELD | Admitting: Physician Assistant

## 2018-06-27 ENCOUNTER — Encounter: Payer: Self-pay | Admitting: Physician Assistant

## 2018-06-27 ENCOUNTER — Other Ambulatory Visit: Payer: Self-pay

## 2018-06-27 DIAGNOSIS — N3000 Acute cystitis without hematuria: Secondary | ICD-10-CM | POA: Diagnosis not present

## 2018-06-27 MED ORDER — NITROFURANTOIN MONOHYD MACRO 100 MG PO CAPS: 100.0000 mg | ORAL_CAPSULE | Freq: Two times a day (BID) | ORAL | 0 refills | Status: DC

## 2018-06-27 NOTE — Progress Notes (Signed)
    Telephone visit  Subjective: CC:uti PCP: Baruch Gouty, FNP RDE:[id number] Carbonell is a 23 y.o. female calls for telephone consult today. Patient provides verbal consent for consult held via phone.  Patient is identified with 2 separate identifiers.  At this time the entire area is on COVID-19 social distancing and stay home orders are in place.  Patient is of higher risk and therefore we are performing this by a virtual method.  Location of patient: home Location of provider: [facility] Others present for call: no  This patient has had several days of dysuria, frequency and nocturia. There is also pain over the bladder in the suprapubic region, no back pain. Denies leakage or hematuria.  Denies fever or chills. No pain in flank area.  ROS: Per HPI  No Known Allergies Past Medical History:  Diagnosis Date  . Anxiety   . Panic attacks     Current Outpatient Medications:  .  naproxen (NAPROSYN) 500 MG tablet, Take 1 tablet (500 mg total) by mouth 2 (two) times daily with a meal., Disp: 60 tablet, Rfl: 1 .  nitrofurantoin, macrocrystal-monohydrate, (MACROBID) 100 MG capsule, Take 1 capsule (100 mg total) by mouth 2 (two) times daily. 1 po BId, Disp: 14 capsule, Rfl: 0 .  Norethin Ace-Eth Estrad-FE (MINASTRIN 24 FE) 1-20 MG-MCG(24) CHEW, Chew 1 tablet by mouth daily. Dispense brand name please, Disp: 28 tablet, Rfl: 12  Assessment/ Plan: 23 y.o. female   1. Acute cystitis without hematuria - nitrofurantoin, macrocrystal-monohydrate, (MACROBID) 100 MG capsule; Take 1 capsule (100 mg total) by mouth 2 (two) times daily. 1 po BId  Dispense: 14 capsule; Refill: 0   Start time: 8:02 AM End time: 8:08 AM  Meds ordered this encounter  Medications  . nitrofurantoin, macrocrystal-monohydrate, (MACROBID) 100 MG capsule    Sig: Take 1 capsule (100 mg total) by mouth 2 (two) times daily. 1 po BId    Dispense:  14 capsule    Refill:  0    Order Specific Question:   Supervising Provider   Answer:   Janora Norlander [[id number]]    Particia Nearing PA-C [place] [phone]

## 2018-08-15 ENCOUNTER — Other Ambulatory Visit: Payer: Self-pay

## 2018-08-15 ENCOUNTER — Ambulatory Visit (INDEPENDENT_AMBULATORY_CARE_PROVIDER_SITE_OTHER): Payer: BLUE CROSS/BLUE SHIELD | Admitting: Family Medicine

## 2018-08-15 DIAGNOSIS — N3000 Acute cystitis without hematuria: Secondary | ICD-10-CM

## 2018-08-15 MED ORDER — NITROFURANTOIN MONOHYD MACRO 100 MG PO CAPS: 100.0000 mg | ORAL_CAPSULE | Freq: Two times a day (BID) | ORAL | 0 refills | Status: DC

## 2018-08-15 NOTE — Progress Notes (Signed)
Telephone visit  Subjective: CC: UTI PCP: Baruch Gouty, FNP LSL:[id number] Katrina Lin is a 23 y.o. female calls for telephone consult today. Patient provides verbal consent for consult held via phone.  Location of patient: home Location of provider: Working remotely from home Others present for call: none  1. Urinary symptoms Patient reports a 2 day h/o urinary urgency and lower abdominal pressure.  Denies urinary frequency, hematuria, fevers, chills, abdominal pain, nausea, vomiting.  Patient has used nothing for symptoms.  She reports that she took 2 at home urinary tract infection tests and they were somewhat positive.  Patient does have a h/o frequent or recurrent UTIs.  Last mental cycle was Jul 11, 2018.  She reports compliance with her OCP and is due for her menstrual cycle any day now.   ROS: Per HPI  No Known Allergies Past Medical History:  Diagnosis Date  . Anxiety   . Panic attacks     Current Outpatient Medications:  .  naproxen (NAPROSYN) 500 MG tablet, Take 1 tablet (500 mg total) by mouth 2 (two) times daily with a meal., Disp: 60 tablet, Rfl: 1 .  nitrofurantoin, macrocrystal-monohydrate, (MACROBID) 100 MG capsule, Take 1 capsule (100 mg total) by mouth 2 (two) times daily. 1 po BId, Disp: 14 capsule, Rfl: 0 .  Norethin Ace-Eth Estrad-FE (MINASTRIN 24 FE) 1-20 MG-MCG(24) CHEW, Chew 1 tablet by mouth daily. Dispense brand name please, Disp: 28 tablet, Rfl: 12  Assessment/ Plan: 23 y.o. female   1. Acute cystitis without hematuria No red flag signs or symptoms.  Doubt pyelonephritis.  More likely to be uncomplicated cystitis.  However, we did discuss that given recurrence of UTIs she should consider seeing a urologist at some point.  This was offered but patient declined the referral given COVID-19 outbreak.  We discussed reasons for reevaluation.  She will follow-up PRN - nitrofurantoin, macrocrystal-monohydrate, (MACROBID) 100 MG capsule; Take 1 capsule (100 mg total)  by mouth 2 (two) times daily. 1 po BId  Dispense: 14 capsule; Refill: 0   Start time: 8:16am End time: 8:22am  Total time spent on patient care (including telephone call/ virtual visit): 10 minutes  [place], [place] [phone]

## 2018-10-25 ENCOUNTER — Encounter: Payer: Self-pay | Admitting: Physician Assistant

## 2018-10-25 ENCOUNTER — Telehealth (INDEPENDENT_AMBULATORY_CARE_PROVIDER_SITE_OTHER): Payer: BLUE CROSS/BLUE SHIELD | Admitting: Physician Assistant

## 2018-10-25 DIAGNOSIS — N3000 Acute cystitis without hematuria: Secondary | ICD-10-CM | POA: Diagnosis not present

## 2018-10-25 MED ORDER — NITROFURANTOIN MONOHYD MACRO 100 MG PO CAPS: 100.0000 mg | ORAL_CAPSULE | Freq: Two times a day (BID) | ORAL | 0 refills | Status: DC

## 2018-10-25 NOTE — Progress Notes (Signed)
     Telephone visit  Subjective: CC: Urinary tract infection PCP: Baruch Gouty, FNP ACZ:[id number] Balles is a 23 y.o. female calls for video consult today. Patient provides verbal consent for consult held via phone.  Patient is identified with 2 separate identifiers.  At this time the entire area is on COVID-19 social distancing and stay home orders are in place.  Patient is of higher risk and therefore we are performing this by a virtual method.  Location of patient: Home Location of provider: [facility] Others present for call: None  This patient has had several days of dysuria, frequency and nocturia. There is also pain over the bladder in the suprapubic region, no back pain. Denies leakage or hematuria.  Denies fever or chills. No pain in flank area.  She did Azo-Standard test from over-the-counter.  It was positive.  She notes that she gets 3 or 4 infections a year.   ROS: Per HPI  No Known Allergies Past Medical History:  Diagnosis Date  . Anxiety   . Panic attacks     Current Outpatient Medications:  .  naproxen (NAPROSYN) 500 MG tablet, Take 1 tablet (500 mg total) by mouth 2 (two) times daily with a meal., Disp: 60 tablet, Rfl: 1 .  nitrofurantoin, macrocrystal-monohydrate, (MACROBID) 100 MG capsule, Take 1 capsule (100 mg total) by mouth 2 (two) times daily. 1 po BId, Disp: 14 capsule, Rfl: 0 .  Norethin Ace-Eth Estrad-FE (MINASTRIN 24 FE) 1-20 MG-MCG(24) CHEW, Chew 1 tablet by mouth daily. Dispense brand name please, Disp: 28 tablet, Rfl: 12  Assessment/ Plan: 23 y.o. female   1. Acute cystitis without hematuria - nitrofurantoin, macrocrystal-monohydrate, (MACROBID) 100 MG capsule; Take 1 capsule (100 mg total) by mouth 2 (two) times daily. 1 po BId  Dispense: 14 capsule; Refill: 0   No follow-ups on file.  Continue all other maintenance medications as listed above.  Start time: 3:38 PM End time: 3:44 PM  Meds ordered this encounter  Medications  .  nitrofurantoin, macrocrystal-monohydrate, (MACROBID) 100 MG capsule    Sig: Take 1 capsule (100 mg total) by mouth 2 (two) times daily. 1 po BId    Dispense:  14 capsule    Refill:  0    Order Specific Question:   Supervising Provider    Answer:   Janora Norlander [[id number]]    Particia Nearing PA-C [place] [phone]

## 2018-11-28 ENCOUNTER — Telehealth: Payer: Self-pay | Admitting: Family Medicine

## 2018-11-28 NOTE — Telephone Encounter (Signed)
REFERRAL REQUEST Telephone Note  What type of referral do you need?PHYSICAL THERAPY FOR HER BACK   Have you been seen at our office for this problem? In the past she has states we referred her she was going pre COVID but now needs a new order (Advise that they will likely need an appointment with their PCP before a referral can be done)  Is there a particular doctor or location that you prefer? Physical therapy next door  Patient notified that referrals can take up to a week or longer to process. If they haven't heard anything within a week they should call back and speak with the referral department.

## 2018-11-29 ENCOUNTER — Other Ambulatory Visit: Payer: Self-pay | Admitting: *Deleted

## 2018-11-29 ENCOUNTER — Telehealth: Payer: Self-pay | Admitting: Family Medicine

## 2018-11-29 DIAGNOSIS — M5442 Lumbago with sciatica, left side: Secondary | ICD-10-CM

## 2018-11-29 DIAGNOSIS — G8929 Other chronic pain: Secondary | ICD-10-CM

## 2018-11-29 NOTE — Telephone Encounter (Signed)
Referral placed.

## 2018-11-29 NOTE — Telephone Encounter (Signed)
Ok to place referral for PT

## 2018-12-07 ENCOUNTER — Other Ambulatory Visit: Payer: Self-pay

## 2018-12-07 ENCOUNTER — Ambulatory Visit: Payer: BLUE CROSS/BLUE SHIELD | Attending: Family Medicine | Admitting: Physical Therapy

## 2018-12-07 ENCOUNTER — Encounter: Payer: Self-pay | Admitting: Physical Therapy

## 2018-12-07 DIAGNOSIS — G8929 Other chronic pain: Secondary | ICD-10-CM | POA: Diagnosis not present

## 2018-12-07 DIAGNOSIS — M545 Low back pain, unspecified: Secondary | ICD-10-CM

## 2018-12-07 DIAGNOSIS — M6281 Muscle weakness (generalized): Secondary | ICD-10-CM | POA: Diagnosis not present

## 2018-12-07 DIAGNOSIS — M546 Pain in thoracic spine: Secondary | ICD-10-CM | POA: Insufficient documentation

## 2018-12-07 NOTE — Therapy (Signed)
[facility] [place], [place], [address] Phone: [phone]   Fax:  [phone]  Physical Therapy Evaluation  Patient Details  Name: Katrina Lin MRN: [id number] Date of Birth: 1996/03/03 Referring Provider (PT): Darla Lesches, FNP   Encounter Date: 12/07/2018  PT End of Session - 12/07/18 1357    Visit Number  1    Number of Visits  12    Date for PT Re-Evaluation  01/25/19    Authorization Type  Progress note every 10th visit    PT Start Time  1345    PT Stop Time  1430    PT Time Calculation (min)  45 min    Activity Tolerance  Patient tolerated treatment well    Behavior During Therapy  [facility] for tasks assessed/performed       Past Medical History:  Diagnosis Date  . Anxiety   . Panic attacks     History reviewed. No pertinent surgical history.  There were no vitals filed for this visit.   Subjective Assessment - 12/07/18 1440    Subjective  COVID-19 screening performed upon arrival.Patient arrives to physical therapy with an ongoing two year history of back pain. Patient reports pain exacerbated in the thoracolumbar spine about February 2020. Patient reports difficulties with home activities like washing dishes and caregiving activities like lifting her child and bathing her child. Patient reports pain at worst as 9/10 and pain at best is 3/10. Patient is compliant with HEP provided from last visit. Patient's goals are to decrease pain, improve movement, improve ability to perform home and care giving needs.    Pertinent History  anxiety    Limitations  Lifting;House hold activities;Standing    Patient Stated Goals  decrease pain, improve care giving activities for 23 year old    Currently in Pain?  Yes    Pain Score  4     Pain Location  Back    Pain Orientation  Mid;Lower    Pain Descriptors / Indicators  Sore    Pain Type  Chronic pain    Pain Onset  More than a month ago    Pain Frequency  Constant    Aggravating Factors   lifting, doing dishes, caregiving activities for toddler    Pain Relieving Factors  heat, TENs, heating pad    Effect of Pain on Daily Activities  difficulties with care giving activities and doing dishes         [facility] PT Assessment - 12/07/18 0001      Assessment   Medical Diagnosis  Chronic midline low back pain wiht left-sided sciatica    Referring Provider (PT)  Darla Lesches, FNP    Onset Date/Surgical Date  --   2 years ago   Next MD Visit  n/a    Prior Therapy  yes      Precautions   Precautions  None      Restrictions   Weight Bearing Restrictions  No      Balance Screen   Has the patient fallen in the past 6 months  No    Has the patient had a decrease in activity level because of a fear of falling?   No    Is the patient reluctant to leave their home because of a fear of falling?   No      Home Social worker  Private residence    Living Arrangements  Children;Spouse/significant other;Parent      Prior Function  Level of Independence  Independent      Posture/Postural Control   Posture/Postural Control  Postural limitations    Postural Limitations  Rounded Shoulders;Forward head      ROM / Strength   AROM / PROM / Strength  AROM;Strength      AROM   AROM Assessment Site  Lumbar    Lumbar Flexion  13" finger tip to floor    Lumbar - Right Side Bend  19" finger tip to floor    Lumbar - Left Side Bend  19.5" finger tip to floor      Strength   Strength Assessment Site  Shoulder;Hip;Knee    Right/Left Shoulder  Right;Left    Right Shoulder Flexion  3+/5    Right Shoulder ABduction  3+/5    Right Shoulder Internal Rotation  4-/5    Right Shoulder External Rotation  4-/5    Left Shoulder Flexion  3+/5    Left Shoulder ABduction  3+/5    Left Shoulder Internal Rotation  4-/5    Left Shoulder External Rotation  4-/5    Right/Left Hip  Right;Left    Right Hip Flexion  4/5    Right Hip Extension  3+/5    Right Hip  ABduction  4-/5    Left Hip Flexion  4/5    Left Hip Extension  3+/5    Left Hip ABduction  4-/5    Right/Left Knee  Right;Left    Right Knee Flexion  4/5    Right Knee Extension  4/5    Left Knee Flexion  4/5    Left Knee Extension  4/5      Palpation   Palpation comment  minimal tenderness to palpation to bilateral thoracolumbar paraspinals.      Transfers   Transfers  Independent with all Transfers      Ambulation/Gait   Gait Pattern  Within Functional Limits                Objective measurements completed on examination: See above findings.      [facility] Adult PT Treatment/Exercise - 12/07/18 0001      Modalities   Modalities  Electrical Stimulation;Moist Heat      Moist Heat Therapy   Number Minutes Moist Heat  10 Minutes    Moist Heat Location  Lumbar Spine      Electrical Stimulation   Electrical Stimulation Location  mid to lower back    Electrical Stimulation Action  IFC    Electrical Stimulation Parameters  80-150 hz x10 mins    Electrical Stimulation Goals  Pain             PT Education - 12/07/18 1445    Education Details  rows 45 degrees ABD, horizontal abduction, cat camel, trunk rotation    Person(s) Educated  Patient    Methods  Explanation;Demonstration;Handout    Comprehension  Verbalized understanding;Returned demonstration          PT Long Term Goals - 12/07/18 1615      PT LONG TERM GOAL #1   Title  Patient will be independent with HEP and its progression    Time  6    Period  Weeks    Status  New      PT LONG TERM GOAL #2   Title  Patient will demonstrate 4+/5 or greater LE strength bilaterally to improve stability during functional tasks    Time  6    Period  Weeks  Status  New      PT LONG TERM GOAL #3   Title  Patient will report ability to perform care giving activities and home activities with mid-low back pain less than or equal to 3/10.    Time  6    Period  Weeks    Status  New      PT LONG TERM GOAL  #4   Title  Patient will demonstrate proper body mechanics for household chores, child care needs, and lifting to protect back during activities.    Time  6    Period  Weeks    Status  New             Plan - 12/07/18 1448    Clinical Impression Statement  Patient is a 23 year old female who presents to physical therapy with decreased lumbar ROM, decreased shoulder MMT and abnormal posture. Patient noted with rounded shoulders forward head and increased thoracic kyphosis in sitting. Patient with minimal tenderness to palpation to thoracolumbar paraspinals. Patient and PT discussed HEP as well as plan of care to address goals. Patient reported agreement and understanding. Patient would benefit from skilled physical therapy to address deficits and address patient's goals.    Personal Factors and Comorbidities  Time since onset of injury/illness/exacerbation    Examination-Activity Limitations  Bend;Caring for Others;Carry;Lift    Stability/Clinical Decision Making  Stable/Uncomplicated    Clinical Decision Making  Low    Rehab Potential  Good    PT Frequency  2x / week    PT Duration  6 weeks    PT Treatment/Interventions  ADLs/Self Care Home Management;Cryotherapy;Electrical Stimulation;Iontophoresis 4mg /ml Dexamethasone;Moist Heat;Traction;Ultrasound;Therapeutic exercise;Therapeutic activities;Gait training;Stair training;Neuromuscular re-education;Patient/family education;Passive range of motion;Vasopneumatic Device;Manual techniques;Dry needling    PT Next Visit Plan  Nustep, bike, or elliptical. postural exercises. modalities PRN for pain relief    PT Home Exercise Plan  see patient education section    Consulted and Agree with Plan of Care  Patient       Patient will benefit from skilled therapeutic intervention in order to improve the following deficits and impairments:  Decreased activity tolerance, Decreased range of motion, Difficulty walking, Pain, Decreased strength, Postural  dysfunction  Visit Diagnosis: Pain in thoracic spine  Chronic midline low back pain, unspecified whether sciatica present  Muscle weakness (generalized)     Problem List Patient Active Problem List   Diagnosis Date Noted  . Chronic midline low back pain with left-sided sciatica 01/12/2018  . BMI 29.0-29.9,adult 01/12/2018   Gabriela Eves, PT, DPT 12/07/2018, 4:28 PM  [facility] [place], [place], [address] Phone: [phone]   Fax:  [phone]  Name: Katrina Lin MRN: [id number] Date of Birth: 1996-01-11

## 2018-12-07 NOTE — Addendum Note (Signed)
Addended by: Gabriela Eves on: 12/07/2018 04:31 PM   Modules accepted: Orders

## 2018-12-11 ENCOUNTER — Ambulatory Visit: Payer: BLUE CROSS/BLUE SHIELD | Admitting: Physical Therapy

## 2018-12-11 ENCOUNTER — Other Ambulatory Visit: Payer: Self-pay

## 2018-12-11 ENCOUNTER — Encounter: Payer: Self-pay | Admitting: Physical Therapy

## 2018-12-11 DIAGNOSIS — M6281 Muscle weakness (generalized): Secondary | ICD-10-CM

## 2018-12-11 DIAGNOSIS — M546 Pain in thoracic spine: Secondary | ICD-10-CM | POA: Diagnosis not present

## 2018-12-11 DIAGNOSIS — M545 Low back pain: Secondary | ICD-10-CM | POA: Diagnosis not present

## 2018-12-11 DIAGNOSIS — G8929 Other chronic pain: Secondary | ICD-10-CM

## 2018-12-11 NOTE — Therapy (Signed)
[facility] [place], [place], [address] Phone: [phone]   Fax:  [phone]  Physical Therapy Treatment  Patient Details  Name: Katrina Lin MRN: [id number] Date of Birth: 1995/11/09 Referring Provider (PT): Darla Lesches, FNP   Encounter Date: 12/11/2018  PT End of Session - 12/11/18 1344    Visit Number  2    Number of Visits  12    Date for PT Re-Evaluation  01/25/19    Authorization Type  Progress note every 10th visit    PT Start Time  0100    PT Stop Time  0157    PT Time Calculation (min)  57 min    Activity Tolerance  Patient tolerated treatment well    Behavior During Therapy  [facility] for tasks assessed/performed       Past Medical History:  Diagnosis Date  . Anxiety   . Panic attacks     History reviewed. No pertinent surgical history.  There were no vitals filed for this visit.  Subjective Assessment - 12/11/18 1258    Subjective  COVID-19 screening performed upon arrival. Patient arrived with ongoing pain in mid back    Pertinent History  anxiety    Limitations  Lifting;House hold activities;Standing    Patient Stated Goals  decrease pain, improve care giving activities for 23 year old    Pain Score  6     Pain Location  Back    Pain Orientation  Mid;Right;Left    Pain Descriptors / Indicators  Sore    Pain Type  Chronic pain    Pain Onset  More than a month ago    Pain Frequency  Constant    Aggravating Factors   lifting and bending    Pain Relieving Factors  rest and heat                       [facility] Adult PT Treatment/Exercise - 12/11/18 0001      Exercises   Exercises  Lumbar;Shoulder      Lumbar Exercises: Supine   Bridge with clamshell  20 reps;10 reps   red t-band     Shoulder Exercises: Prone   Other Prone Exercises  "W", "T", "V" 2x10 each way      Shoulder Exercises: Standing   Extension  Strengthening;Both;20 reps;10 reps;Other (comment)    Theraband Level  (Shoulder Extension)  Other (comment)    Extension Limitations  green XTS    Retraction  Strengthening;Both;20 reps;10 reps    Theraband Level (Shoulder Retraction)  Other (comment)    Retraction Weight (lbs)  green xts    Diagonals  20 reps;Theraband;Strengthening;Both    Theraband Level (Shoulder Diagonals)  Level 2 (Red)      Shoulder Exercises: ROM/Strengthening   Wall Pushups  20 reps      Moist Heat Therapy   Number Minutes Moist Heat  15 Minutes    Moist Heat Location  Lumbar Spine      Electrical Stimulation   Electrical Stimulation Location  mid to lower back    Electrical Stimulation Action  IFC    Electrical Stimulation Parameters  80-150hz  x15min    Electrical Stimulation Goals  Pain                  PT Long Term Goals - 12/07/18 1615      PT LONG TERM GOAL #1   Title  Patient will be independent with HEP and its progression  Time  6    Period  Weeks    Status  New      PT LONG TERM GOAL #2   Title  Patient will demonstrate 4+/5 or greater LE strength bilaterally to improve stability during functional tasks    Time  6    Period  Weeks    Status  New      PT LONG TERM GOAL #3   Title  Patient will report ability to perform care giving activities and home activities with mid-low back pain less than or equal to 3/10.    Time  6    Period  Weeks    Status  New      PT LONG TERM GOAL #4   Title  Patient will demonstrate proper body mechanics for household chores, child care needs, and lifting to protect back during activities.    Time  6    Period  Weeks    Status  New            Plan - 12/11/18 1345    Clinical Impression Statement  Patient tolerated treatment well today. Patient able to progress with upper and lower back exercises and LE strengthening with no increased discomfort. Patient reported doing HEP daily as instructed by PT. Patient goals progressing at this time.    Personal Factors and Comorbidities  Time since onset of  injury/illness/exacerbation    Examination-Activity Limitations  Bend;Caring for Others;Carry;Lift    Stability/Clinical Decision Making  Stable/Uncomplicated    Rehab Potential  Good    PT Frequency  2x / week    PT Treatment/Interventions  ADLs/Self Care Home Management;Cryotherapy;Electrical Stimulation;Iontophoresis 4mg /ml Dexamethasone;Moist Heat;Traction;Ultrasound;Therapeutic exercise;Therapeutic activities;Gait training;Stair training;Neuromuscular re-education;Patient/family education;Passive range of motion;Vasopneumatic Device;Manual techniques;Dry needling    PT Next Visit Plan  cont with POC for Nustep, bike, or elliptical. postural exercises. modalities PRN for pain relief    Consulted and Agree with Plan of Care  Patient       Patient will benefit from skilled therapeutic intervention in order to improve the following deficits and impairments:  Decreased activity tolerance, Decreased range of motion, Difficulty walking, Pain, Decreased strength, Postural dysfunction  Visit Diagnosis: Chronic midline low back pain, unspecified whether sciatica present  Muscle weakness (generalized)  Pain in thoracic spine     Problem List Patient Active Problem List   Diagnosis Date Noted  . Chronic midline low back pain with left-sided sciatica 01/12/2018  . BMI 29.0-29.9,adult 01/12/2018    Tammi Boulier P, PTA 12/11/2018, 2:00 PM  [facility] [place], [place], [address] Phone: [phone]   Fax:  [phone]  Name: Katrina Lin MRN: [id number] Date of Birth: Jul 05, 1995

## 2018-12-14 ENCOUNTER — Encounter: Payer: Self-pay | Admitting: Physical Therapy

## 2018-12-14 ENCOUNTER — Other Ambulatory Visit: Payer: Self-pay

## 2018-12-14 ENCOUNTER — Ambulatory Visit: Payer: BLUE CROSS/BLUE SHIELD | Admitting: Physical Therapy

## 2018-12-14 DIAGNOSIS — M546 Pain in thoracic spine: Secondary | ICD-10-CM | POA: Diagnosis not present

## 2018-12-14 DIAGNOSIS — M545 Low back pain: Secondary | ICD-10-CM | POA: Diagnosis not present

## 2018-12-14 DIAGNOSIS — M6281 Muscle weakness (generalized): Secondary | ICD-10-CM | POA: Diagnosis not present

## 2018-12-14 DIAGNOSIS — G8929 Other chronic pain: Secondary | ICD-10-CM

## 2018-12-14 NOTE — Therapy (Signed)
[facility] [place], [place], [address] Phone: [phone]   Fax:  [phone]  Physical Therapy Treatment  Patient Details  Name: Katrina Lin MRN: [id number] Date of Birth: April 28, 1995 Referring Provider (PT): Darla Lesches, FNP   Encounter Date: 12/14/2018  PT End of Session - 12/14/18 1350    Visit Number  3    Number of Visits  12    Date for PT Re-Evaluation  01/25/19    Authorization Type  Progress note every 10th visit    PT Start Time  1347    PT Stop Time  1431    PT Time Calculation (min)  44 min    Activity Tolerance  Patient tolerated treatment well    Behavior During Therapy  [facility] for tasks assessed/performed       Past Medical History:  Diagnosis Date  . Anxiety   . Panic attacks     History reviewed. No pertinent surgical history.  There were no vitals filed for this visit.  Subjective Assessment - 12/14/18 1339    Subjective  COVID-19 screening performed upon arrival. Patient arrived with ongoing pain in mid back.    Pertinent History  anxiety    Limitations  Lifting;House hold activities;Standing    Patient Stated Goals  decrease pain, improve care giving activities for 23 year old    Currently in Pain?  Yes    Pain Score  6     Pain Location  Back    Pain Orientation  Mid    Pain Descriptors / Indicators  Discomfort    Pain Type  Chronic pain    Pain Onset  More than a month ago    Pain Frequency  Constant         [facility] PT Assessment - 12/14/18 0001      Assessment   Medical Diagnosis  Chronic midline low back pain wiht left-sided sciatica    Referring Provider (PT)  Darla Lesches, FNP    Next MD Visit  n/a    Prior Therapy  yes      Precautions   Precautions  None      Restrictions   Weight Bearing Restrictions  No                   [facility] Adult PT Treatment/Exercise - 12/14/18 0001      Lumbar Exercises: Aerobic   Nustep  L4 x9 min with posture and core focus       Shoulder Exercises: Seated   Diagonals  Strengthening;Both;15 reps;Theraband    Theraband Level (Shoulder Diagonals)  Level 1 (Yellow)    Diagonals Limitations  on theraball      Shoulder Exercises: Prone   Other Prone Exercises  WITY isolated over ball AROM x15 reps each      Shoulder Exercises: Standing   Extension  Strengthening;Both;20 reps;10 reps;Other (comment)    Theraband Level (Shoulder Extension)  Other (comment)    Extension Limitations  green XTS    Retraction  Strengthening;Both;20 reps;10 reps    Theraband Level (Shoulder Retraction)  Other (comment)    Retraction Weight (lbs)  green xts    Other Standing Exercises  Wall walks yellow theraband x1 RT      Shoulder Exercises: ROM/Strengthening   UBE (Upper Arm Bike)  120 RPM x8 min    Wall Pushups  20 reps                  PT Long  Term Goals - 12/07/18 1615      PT LONG TERM GOAL #1   Title  Patient will be independent with HEP and its progression    Time  6    Period  Weeks    Status  New      PT LONG TERM GOAL #2   Title  Patient will demonstrate 4+/5 or greater LE strength bilaterally to improve stability during functional tasks    Time  6    Period  Weeks    Status  New      PT LONG TERM GOAL #3   Title  Patient will report ability to perform care giving activities and home activities with mid-low back pain less than or equal to 3/10.    Time  6    Period  Weeks    Status  New      PT LONG TERM GOAL #4   Title  Patient will demonstrate proper body mechanics for household chores, child care needs, and lifting to protect back during activities.    Time  6    Period  Weeks    Status  New            Plan - 12/14/18 1556    Clinical Impression Statement  Patient reporting more mid back pain upon arrival. Patient guided through therex with VCs for core activation as well as erect posture to train both thoracic and lumbar spine. No complaints of any increased pain during therex only  reports of soreness due to inactivity. Good lumbar stability noted with sitting on theraball.    Personal Factors and Comorbidities  Time since onset of injury/illness/exacerbation    Examination-Activity Limitations  Bend;Caring for Others;Carry;Lift    Stability/Clinical Decision Making  Stable/Uncomplicated    Rehab Potential  Good    PT Frequency  2x / week    PT Duration  6 weeks    PT Treatment/Interventions  ADLs/Self Care Home Management;Cryotherapy;Electrical Stimulation;Iontophoresis 4mg /ml Dexamethasone;Moist Heat;Traction;Ultrasound;Therapeutic exercise;Therapeutic activities;Gait training;Stair training;Neuromuscular re-education;Patient/family education;Passive range of motion;Vasopneumatic Device;Manual techniques;Dry needling    PT Next Visit Plan  cont with POC for Nustep, bike, or elliptical. postural exercises. modalities PRN for pain relief    PT Home Exercise Plan  see patient education section    Consulted and Agree with Plan of Care  Patient       Patient will benefit from skilled therapeutic intervention in order to improve the following deficits and impairments:  Decreased activity tolerance, Decreased range of motion, Difficulty walking, Pain, Decreased strength, Postural dysfunction  Visit Diagnosis: Chronic midline low back pain, unspecified whether sciatica present  Muscle weakness (generalized)     Problem List Patient Active Problem List   Diagnosis Date Noted  . Chronic midline low back pain with left-sided sciatica 01/12/2018  . BMI 29.0-29.9,adult 01/12/2018    Standley Brooking, PTA 12/14/2018, 4:32 PM  [facility] [place], [place], [address] Phone: [phone]   Fax:  [phone]  Name: Katrina Lin MRN: [id number] Date of Birth: Nov 25, 1995

## 2018-12-20 ENCOUNTER — Other Ambulatory Visit: Payer: Self-pay

## 2018-12-20 ENCOUNTER — Encounter: Payer: Self-pay | Admitting: Physical Therapy

## 2018-12-20 ENCOUNTER — Ambulatory Visit: Payer: BLUE CROSS/BLUE SHIELD | Admitting: Physical Therapy

## 2018-12-20 DIAGNOSIS — M546 Pain in thoracic spine: Secondary | ICD-10-CM

## 2018-12-20 DIAGNOSIS — M6281 Muscle weakness (generalized): Secondary | ICD-10-CM

## 2018-12-20 DIAGNOSIS — M545 Low back pain, unspecified: Secondary | ICD-10-CM

## 2018-12-20 DIAGNOSIS — G8929 Other chronic pain: Secondary | ICD-10-CM | POA: Diagnosis not present

## 2018-12-20 NOTE — Therapy (Signed)
[facility] [place], [place], [address] Phone: [phone]   Fax:  [phone]  Physical Therapy Treatment  Patient Details  Name: Katrina Lin MRN: [id number] Date of Birth: September 14, 1995 Referring Provider (PT): Darla Lesches, FNP   Encounter Date: 12/20/2018  PT End of Session - 12/20/18 1415    Visit Number  4    Number of Visits  12    Date for PT Re-Evaluation  01/25/19    Authorization Type  Progress note every 10th visit    PT Start Time  0144    PT Stop Time  0225    PT Time Calculation (min)  41 min    Activity Tolerance  Patient tolerated treatment well    Behavior During Therapy  [facility] for tasks assessed/performed       Past Medical History:  Diagnosis Date  . Anxiety   . Panic attacks     History reviewed. No pertinent surgical history.  There were no vitals filed for this visit.  Subjective Assessment - 12/20/18 1344    Subjective  COVID-19 screening performed upon arrival. Patient arrived with increased discomfort after sleeping wrong and also holding and picking up child a lot yesterday per reported    Pertinent History  anxiety    Limitations  Lifting;House hold activities;Standing    Patient Stated Goals  decrease pain, improve care giving activities for 23 year old    Currently in Pain?  Yes    Pain Score  7     Pain Location  Back    Pain Orientation  Mid    Pain Descriptors / Indicators  Discomfort    Pain Type  Chronic pain    Pain Onset  More than a month ago    Pain Frequency  Constant    Aggravating Factors   bending and lifting    Pain Relieving Factors  rest/heat                       [facility] Adult PT Treatment/Exercise - 12/20/18 0001      Lumbar Exercises: Aerobic   Nustep  L4 x57min with posture and core focus      Shoulder Exercises: Standing   Extension  Strengthening;Both;20 reps;10 reps;Other (comment)    Theraband Level (Shoulder Extension)  Other  (comment)    Extension Limitations  green XTS    Retraction  Strengthening;Both;20 reps;10 reps    Theraband Level (Shoulder Retraction)  Other (comment)    Retraction Weight (lbs)  green XTS    Other Standing Exercises  green XTS for diagnols 2x10 each      Shoulder Exercises: ROM/Strengthening   Wall Pushups  20 reps      Moist Heat Therapy   Number Minutes Moist Heat  15 Minutes    Moist Heat Location  Lumbar Spine      Electrical Stimulation   Electrical Stimulation Location  mid to lower back    Electrical Stimulation Action  IFC    Electrical Stimulation Parameters  80-150 hz x40min    Electrical Stimulation Goals  Pain                  PT Long Term Goals - 12/20/18 1417      PT LONG TERM GOAL #1   Title  Patient will be independent with HEP and its progression    Time  6    Period  Weeks    Status  On-going  PT LONG TERM GOAL #2   Title  Patient will demonstrate 4+/5 or greater LE strength bilaterally to improve stability during functional tasks    Time  6    Period  Weeks    Status  On-going   NT 12/20/18     PT LONG TERM GOAL #3   Title  Patient will report ability to perform care giving activities and home activities with mid-low back pain less than or equal to 3/10.    Time  6    Period  Weeks    Status  On-going      PT LONG TERM GOAL #4   Title  Patient will demonstrate proper body mechanics for household chores, child care needs, and lifting to protect back during activities.    Period  Weeks    Status  On-going            Plan - 12/20/18 1418    Clinical Impression Statement  Patient tolerated treatment fair due to increased pain reported today. Patient able to perform some activities yet no progression today. Patient continues to have increased pain with prolong activity and picking up her child. goals ongoing at this time.    Personal Factors and Comorbidities  Time since onset of injury/illness/exacerbation     Examination-Activity Limitations  Bend;Caring for Others;Carry;Lift    Stability/Clinical Decision Making  Stable/Uncomplicated    Rehab Potential  Good    PT Frequency  2x / week    PT Treatment/Interventions  ADLs/Self Care Home Management;Cryotherapy;Electrical Stimulation;Iontophoresis 4mg /ml Dexamethasone;Moist Heat;Traction;Ultrasound;Therapeutic exercise;Therapeutic activities;Gait training;Stair training;Neuromuscular re-education;Patient/family education;Passive range of motion;Vasopneumatic Device;Manual techniques;Dry needling    PT Next Visit Plan  cont with POC for Nustep, bike, or elliptical. postural exercises. modalities PRN for pain relief    Consulted and Agree with Plan of Care  Patient       Patient will benefit from skilled therapeutic intervention in order to improve the following deficits and impairments:  Decreased activity tolerance, Decreased range of motion, Difficulty walking, Pain, Decreased strength, Postural dysfunction  Visit Diagnosis: Chronic midline low back pain, unspecified whether sciatica present  Muscle weakness (generalized)  Pain in thoracic spine     Problem List Patient Active Problem List   Diagnosis Date Noted  . Chronic midline low back pain with left-sided sciatica 01/12/2018  . BMI 29.0-29.9,adult 01/12/2018    DUNFORD, CHRISTINA P, PTA 12/20/2018, 2:28 PM  [facility] [place], [place], [address] Phone: [phone]   Fax:  [phone]  Name: Charleene Tillett MRN: [id number] Date of Birth: 1995/06/23

## 2018-12-21 ENCOUNTER — Encounter: Payer: Self-pay | Admitting: Physical Therapy

## 2018-12-21 ENCOUNTER — Ambulatory Visit: Payer: BLUE CROSS/BLUE SHIELD | Admitting: Physical Therapy

## 2018-12-21 DIAGNOSIS — M545 Low back pain, unspecified: Secondary | ICD-10-CM

## 2018-12-21 DIAGNOSIS — G8929 Other chronic pain: Secondary | ICD-10-CM | POA: Diagnosis not present

## 2018-12-21 DIAGNOSIS — M6281 Muscle weakness (generalized): Secondary | ICD-10-CM | POA: Diagnosis not present

## 2018-12-21 DIAGNOSIS — M546 Pain in thoracic spine: Secondary | ICD-10-CM | POA: Diagnosis not present

## 2018-12-21 NOTE — Therapy (Signed)
[facility] [place], [place], [address] Phone: [phone]   Fax:  [phone]  Physical Therapy Treatment  Patient Details  Name: Katrina Lin MRN: [id number] Date of Birth: 1995-12-30 Referring Provider (PT): Darla Lesches, FNP   Encounter Date: 12/21/2018  PT End of Session - 12/21/18 1649    Visit Number  5    Number of Visits  12    Date for PT Re-Evaluation  01/25/19    Authorization Type  Progress note every 10th visit    PT Start Time  1646    PT Stop Time  1739    PT Time Calculation (min)  53 min    Activity Tolerance  Patient tolerated treatment well    Behavior During Therapy  [facility] for tasks assessed/performed       Past Medical History:  Diagnosis Date  . Anxiety   . Panic attacks     History reviewed. No pertinent surgical history.  There were no vitals filed for this visit.  Subjective Assessment - 12/21/18 1649    Subjective  COVID 19 screening performed on patient upon arrival. Reports increased thoracic pain yesterday and felt stiff and locked up.    Pertinent History  anxiety    Limitations  Lifting;House hold activities;Standing    Patient Stated Goals  decrease pain, improve care giving activities for 23 year old    Currently in Pain?  Other (Comment)   No pain assessment provided by patient        [facility] PT Assessment - 12/21/18 0001      Assessment   Medical Diagnosis  Chronic midline low back pain wiht left-sided sciatica    Referring Provider (PT)  Darla Lesches, FNP    Next MD Visit  n/a    Prior Therapy  yes      Precautions   Precautions  None      Restrictions   Weight Bearing Restrictions  No                   [facility] Adult PT Treatment/Exercise - 12/21/18 0001      Lumbar Exercises: Stretches   Other Lumbar Stretch Exercise  Standing B open book stretch 10x10 sec    Other Lumbar Stretch Exercise  Thoracic extension on foam roller      Lumbar Exercises:  Aerobic   UBE (Upper Arm Bike)  60 RPM x6 min    Nustep  L4 x40min with posture and core focus      Lumbar Exercises: Standing   Row  Strengthening;Both;20 reps    Row Limitations  Green XTS    Shoulder Extension  Strengthening;Both;20 reps;Limitations    Shoulder Extension Limitations  Green XTS      Modalities   Modalities  Electrical Stimulation;Moist Heat      Moist Heat Therapy   Number Minutes Moist Heat  15 Minutes    Moist Heat Location  Lumbar Spine      Electrical Stimulation   Electrical Stimulation Location  B thoracolumbar paraspinals    Electrical Stimulation Action  IFC    Electrical Stimulation Parameters  80-150 hz x15 min    Electrical Stimulation Goals  Pain                  PT Long Term Goals - 12/20/18 1417      PT LONG TERM GOAL #1   Title  Patient will be independent with HEP and its progression    Time  6    Period  Weeks    Status  On-going      PT LONG TERM GOAL #2   Title  Patient will demonstrate 4+/5 or greater LE strength bilaterally to improve stability during functional tasks    Time  6    Period  Weeks    Status  On-going   NT 12/20/18     PT LONG TERM GOAL #3   Title  Patient will report ability to perform care giving activities and home activities with mid-low back pain less than or equal to 3/10.    Time  6    Period  Weeks    Status  On-going      PT LONG TERM GOAL #4   Title  Patient will demonstrate proper body mechanics for household chores, child care needs, and lifting to protect back during activities.    Period  Weeks    Status  On-going            Plan - 12/21/18 1752    Clinical Impression Statement  Patient guided through more postural strengthening and stretching per thoracic stiffness experienced yesterday. Patient able to demonstrate good posture following VCs. Patient demonstrated more L rotation limitation during standing open book stretch comparatively. Normal modalities response noted following  removal of the modalities.    Personal Factors and Comorbidities  Time since onset of injury/illness/exacerbation    Examination-Activity Limitations  Bend;Caring for Others;Carry;Lift    Stability/Clinical Decision Making  Stable/Uncomplicated    Rehab Potential  Good    PT Frequency  2x / week    PT Duration  6 weeks    PT Treatment/Interventions  ADLs/Self Care Home Management;Cryotherapy;Electrical Stimulation;Iontophoresis 4mg /ml Dexamethasone;Moist Heat;Traction;Ultrasound;Therapeutic exercise;Therapeutic activities;Gait training;Stair training;Neuromuscular re-education;Patient/family education;Passive range of motion;Vasopneumatic Device;Manual techniques;Dry needling    PT Next Visit Plan  cont with POC for Nustep, bike, or elliptical. postural exercises. modalities PRN for pain relief    PT Home Exercise Plan  see patient education section    Consulted and Agree with Plan of Care  Patient       Patient will benefit from skilled therapeutic intervention in order to improve the following deficits and impairments:  Decreased activity tolerance, Decreased range of motion, Difficulty walking, Pain, Decreased strength, Postural dysfunction  Visit Diagnosis: Chronic midline low back pain, unspecified whether sciatica present  Muscle weakness (generalized)     Problem List Patient Active Problem List   Diagnosis Date Noted  . Chronic midline low back pain with left-sided sciatica 01/12/2018  . BMI 29.0-29.9,adult 01/12/2018    Standley Brooking, PTA 12/21/2018, 5:54 PM  [facility] [address] [place], [place], [address] Phone: [phone]   Fax:  [phone]  Name: Katrina Lin MRN: [id number] Date of Birth: 09-05-1995

## 2018-12-26 ENCOUNTER — Other Ambulatory Visit: Payer: Self-pay

## 2018-12-26 ENCOUNTER — Ambulatory Visit: Payer: BLUE CROSS/BLUE SHIELD | Admitting: Physical Therapy

## 2018-12-26 ENCOUNTER — Encounter: Payer: Self-pay | Admitting: Physical Therapy

## 2018-12-26 DIAGNOSIS — G8929 Other chronic pain: Secondary | ICD-10-CM | POA: Diagnosis not present

## 2018-12-26 DIAGNOSIS — M545 Low back pain, unspecified: Secondary | ICD-10-CM

## 2018-12-26 DIAGNOSIS — M546 Pain in thoracic spine: Secondary | ICD-10-CM

## 2018-12-26 DIAGNOSIS — M6281 Muscle weakness (generalized): Secondary | ICD-10-CM

## 2018-12-26 NOTE — Therapy (Signed)
[facility] [place], [place], [address] Phone: [phone]   Fax:  [phone]  Physical Therapy Treatment  Patient Details  Name: Katrina Lin MRN: [id number] Date of Birth: 07-04-1995 Referring Provider (PT): Darla Lesches, FNP   Encounter Date: 12/26/2018  PT End of Session - 12/26/18 1701    Visit Number  6    Number of Visits  12    Date for PT Re-Evaluation  01/25/19    Authorization Type  Progress note every 10th visit    PT Start Time  1648    PT Stop Time  1735    PT Time Calculation (min)  47 min    Activity Tolerance  Patient tolerated treatment well    Behavior During Therapy  [facility] for tasks assessed/performed       Past Medical History:  Diagnosis Date  . Anxiety   . Panic attacks     History reviewed. No pertinent surgical history.  There were no vitals filed for this visit.  Subjective Assessment - 12/26/18 1700    Subjective  COVID 19 screening performed on patient upon arrival. Patient reports more pain today at 7/10.    Pertinent History  anxiety    Limitations  Lifting;House hold activities;Standing    Patient Stated Goals  decrease pain, improve care giving activities for 23 year old    Currently in Pain?  Yes    Pain Score  7     Pain Location  Back    Pain Orientation  Mid    Pain Descriptors / Indicators  Discomfort    Pain Type  Chronic pain    Pain Onset  More than a month ago    Pain Frequency  Constant         [facility] PT Assessment - 12/26/18 0001      Assessment   Medical Diagnosis  Chronic midline low back pain wiht left-sided sciatica    Referring Provider (PT)  Darla Lesches, FNP    Next MD Visit  n/a    Prior Therapy  yes      Precautions   Precautions  None      Restrictions   Weight Bearing Restrictions  No                   [facility] Adult PT Treatment/Exercise - 12/26/18 0001      Lumbar Exercises: Aerobic   Nustep  L4 x15 min      Shoulder  Exercises: Standing   Diagonals  Strengthening;Both;10 reps;Theraband    Theraband Level (Shoulder Diagonals)  Level 2 (Red)      Shoulder Exercises: ROM/Strengthening   UBE (Upper Arm Bike)  120 RPM x8 min    Lat Pull  3 plate;20 reps    Cybex Press  3 plate;20 reps    Cybex Row  3 plate;20 reps    Wall Pushups  20 reps      Modalities   Modalities  Electrical Stimulation;Moist Heat      Moist Heat Therapy   Number Minutes Moist Heat  10 Minutes    Moist Heat Location  Lumbar Spine      Electrical Stimulation   Electrical Stimulation Location  B thoracolumbar paraspinals    Electrical Stimulation Action  IFC    Electrical Stimulation Parameters  80-150 hz x110 min    Electrical Stimulation Goals  Pain  PT Long Term Goals - 12/20/18 1417      PT LONG TERM GOAL #1   Title  Patient will be independent with HEP and its progression    Time  6    Period  Weeks    Status  On-going      PT LONG TERM GOAL #2   Title  Patient will demonstrate 4+/5 or greater LE strength bilaterally to improve stability during functional tasks    Time  6    Period  Weeks    Status  On-going   NT 12/20/18     PT LONG TERM GOAL #3   Title  Patient will report ability to perform care giving activities and home activities with mid-low back pain less than or equal to 3/10.    Time  6    Period  Weeks    Status  On-going      PT LONG TERM GOAL #4   Title  Patient will demonstrate proper body mechanics for household chores, child care needs, and lifting to protect back during activities.    Period  Weeks    Status  On-going            Plan - 12/26/18 1730    Clinical Impression Statement  Patient presented in clinic with increased thoracolumbar pain today. Patient able to tolerate more machine and posture strengthening. Patient felt great stretch with standing open book stretch. More VCs provided to correct wall pushup technique. Core activation added to therex to  incorporate core strengthening as well. Normal modalities response noted following removal of the modalities.    Personal Factors and Comorbidities  Time since onset of injury/illness/exacerbation    Examination-Activity Limitations  Bend;Caring for Others;Carry;Lift    Stability/Clinical Decision Making  Stable/Uncomplicated    Rehab Potential  Good    PT Frequency  2x / week    PT Duration  6 weeks    PT Treatment/Interventions  ADLs/Self Care Home Management;Cryotherapy;Electrical Stimulation;Iontophoresis 4mg /ml Dexamethasone;Moist Heat;Traction;Ultrasound;Therapeutic exercise;Therapeutic activities;Gait training;Stair training;Neuromuscular re-education;Patient/family education;Passive range of motion;Vasopneumatic Device;Manual techniques;Dry needling    PT Next Visit Plan  cont with POC for Nustep, bike, or elliptical. postural exercises. modalities PRN for pain relief    PT Home Exercise Plan  see patient education section    Consulted and Agree with Plan of Care  Patient       Patient will benefit from skilled therapeutic intervention in order to improve the following deficits and impairments:  Decreased activity tolerance, Decreased range of motion, Difficulty walking, Pain, Decreased strength, Postural dysfunction  Visit Diagnosis: Chronic midline low back pain, unspecified whether sciatica present  Muscle weakness (generalized)  Pain in thoracic spine     Problem List Patient Active Problem List   Diagnosis Date Noted  . Chronic midline low back pain with left-sided sciatica 01/12/2018  . BMI 29.0-29.9,adult 01/12/2018    Standley Brooking, PTA 12/26/2018, 5:47 PM  [facility] [address] [place], [place], [address] Phone: [phone]   Fax:  [phone]  Name: Katrina Lin MRN: [id number] Date of Birth: [birth year]

## 2018-12-28 ENCOUNTER — Other Ambulatory Visit: Payer: Self-pay

## 2018-12-28 ENCOUNTER — Encounter: Payer: Self-pay | Admitting: Physical Therapy

## 2018-12-28 ENCOUNTER — Ambulatory Visit: Payer: BLUE CROSS/BLUE SHIELD | Admitting: Physical Therapy

## 2018-12-28 DIAGNOSIS — M6281 Muscle weakness (generalized): Secondary | ICD-10-CM | POA: Diagnosis not present

## 2018-12-28 DIAGNOSIS — M546 Pain in thoracic spine: Secondary | ICD-10-CM | POA: Diagnosis not present

## 2018-12-28 DIAGNOSIS — G8929 Other chronic pain: Secondary | ICD-10-CM | POA: Diagnosis not present

## 2018-12-28 DIAGNOSIS — M545 Low back pain: Secondary | ICD-10-CM

## 2018-12-28 NOTE — Therapy (Signed)
[facility] [place], [place], [address] Phone: [phone]   Fax:  [phone]  Physical Therapy Treatment  Patient Details  Name: Katrina Lin MRN: [id number] Date of Birth: 1995-12-02 Referring Provider (PT): Darla Lesches, FNP   Encounter Date: 12/28/2018  PT End of Session - 12/28/18 1455    Visit Number  7    Number of Visits  12    Date for PT Re-Evaluation  01/25/19    Authorization Type  Progress note every 10th visit    PT Start Time  0237    PT Stop Time  0314    PT Time Calculation (min)  37 min    Activity Tolerance  Patient tolerated treatment well    Behavior During Therapy  [facility] for tasks assessed/performed       Past Medical History:  Diagnosis Date  . Anxiety   . Panic attacks     History reviewed. No pertinent surgical history.  There were no vitals filed for this visit.  Subjective Assessment - 12/28/18 1442    Subjective  COVID 19 screening performed on patient upon arrival. Patient reported some bad days yet overall some improvement    Pertinent History  anxiety    Limitations  Lifting;House hold activities;Standing    Patient Stated Goals  decrease pain, improve care giving activities for 23 year old    Currently in Pain?  Yes    Pain Score  6     Pain Location  Back    Pain Orientation  Mid    Pain Descriptors / Indicators  Discomfort    Pain Type  Chronic pain    Pain Onset  More than a month ago    Pain Frequency  Constant    Aggravating Factors   bending and lifting    Pain Relieving Factors  rest and heat                       [facility] Adult PT Treatment/Exercise - 12/28/18 0001      Lumbar Exercises: Aerobic   Nustep  L4 x10 min      Shoulder Exercises: ROM/Strengthening   UBE (Upper Arm Bike)  120 RPM x4 min    Lat Pull  3 plate;20 reps    Cybex Press  3 plate;20 reps    Cybex Row  3 plate;20 reps    Cybex Row Limitations  3plt x20 upper and lower    Wall Pushups Limitations  push up at incline x20      Moist Heat Therapy   Number Minutes Moist Heat  10 Minutes    Moist Heat Location  Lumbar Spine      Electrical Stimulation   Electrical Stimulation Location  B thoracolumbar paraspinals    Electrical Stimulation Action  IFC    Electrical Stimulation Parameters  80-'150hz'$  x63mn    Electrical Stimulation Goals  Pain                  PT Long Term Goals - 12/28/18 1446      PT LONG TERM GOAL #1   Title  Patient will be independent with HEP and its progression    Time  6    Period  Weeks    Status  On-going      PT LONG TERM GOAL #2   Title  Patient will demonstrate 4+/5 or greater LE strength bilaterally to improve stability during functional tasks    Time  6    Period  Weeks    Status  On-going      PT LONG TERM GOAL #3   Title  Patient will report ability to perform care giving activities and home activities with mid-low back pain less than or equal to 3/10.    Time  6    Period  Weeks    Status  On-going   5-6/10 12/28/18     PT LONG TERM GOAL #4   Title  Patient will demonstrate proper body mechanics for household chores, child care needs, and lifting to protect back during activities.    Time  6    Period  Weeks    Status  Achieved   12/28/18           Plan - 12/28/18 1501    Clinical Impression Statement  Patient tolerated treatment well today. Patient able to progress with exercises with no increased discomfort. Patient continues to have pain in back increase with ADL's and care of child. Patient understands the correct technique and posture of lifting and bending. Patient met LTG #4 today with others progressing due t pain limitations.    Personal Factors and Comorbidities  Time since onset of injury/illness/exacerbation    Examination-Activity Limitations  Bend;Caring for Others;Carry;Lift    Stability/Clinical Decision Making  Stable/Uncomplicated    Rehab Potential  Good    PT Frequency  2x  / week    PT Duration  6 weeks    PT Treatment/Interventions  ADLs/Self Care Home Management;Cryotherapy;Electrical Stimulation;Iontophoresis 10m/ml Dexamethasone;Moist Heat;Traction;Ultrasound;Therapeutic exercise;Therapeutic activities;Gait training;Stair training;Neuromuscular re-education;Patient/family education;Passive range of motion;Vasopneumatic Device;Manual techniques;Dry needling    PT Next Visit Plan  cont with POC for Nustep, bike, or elliptical. postural exercises. modalities PRN for pain relief    Consulted and Agree with Plan of Care  Patient       Patient will benefit from skilled therapeutic intervention in order to improve the following deficits and impairments:  Decreased activity tolerance, Decreased range of motion, Difficulty walking, Pain, Decreased strength, Postural dysfunction  Visit Diagnosis: Chronic midline low back pain, unspecified whether sciatica present  Muscle weakness (generalized)  Pain in thoracic spine     Problem List Patient Active Problem List   Diagnosis Date Noted  . Chronic midline low back pain with left-sided sciatica 01/12/2018  . BMI 29.0-29.9,adult 24/09/2017    DUNFORD, CHRISTINA P, PTA 12/28/2018, 3:15 PM  [facility] [address] [address]Phone: [phone]  Fax:  [phone] Name: CSenora LacsonMRN: [id number] of Birth: 112-24-1997

## 2019-01-02 ENCOUNTER — Ambulatory Visit: Payer: BLUE CROSS/BLUE SHIELD | Admitting: Physical Therapy

## 2019-01-02 ENCOUNTER — Other Ambulatory Visit: Payer: Self-pay

## 2019-01-02 DIAGNOSIS — M546 Pain in thoracic spine: Secondary | ICD-10-CM | POA: Diagnosis not present

## 2019-01-02 DIAGNOSIS — M545 Low back pain: Secondary | ICD-10-CM | POA: Diagnosis not present

## 2019-01-02 DIAGNOSIS — G8929 Other chronic pain: Secondary | ICD-10-CM | POA: Diagnosis not present

## 2019-01-02 DIAGNOSIS — M6281 Muscle weakness (generalized): Secondary | ICD-10-CM

## 2019-01-02 NOTE — Therapy (Signed)
[facility] [place], [place], [address] Phone: [phone]   Fax:  [phone]  Physical Therapy Treatment  Patient Details  Name: Katrina Lin MRN: [id number] Date of Birth: 1996-01-12 Referring Provider (PT): Darla Lesches, FNP   Encounter Date: 01/02/2019  PT End of Session - 01/02/19 1708    Visit Number  8    Number of Visits  12    Date for PT Re-Evaluation  01/25/19    Authorization Type  Progress note every 10th visit    PT Start Time  1432    PT Stop Time  1528    PT Time Calculation (min)  56 min    Activity Tolerance  Patient tolerated treatment well    Behavior During Therapy  [facility] for tasks assessed/performed       Past Medical History:  Diagnosis Date  . Anxiety   . Panic attacks     No past surgical history on file.  There were no vitals filed for this visit.  Subjective Assessment - 01/02/19 1443    Subjective  COVID 19 screening performed on patient upon arrival. Patient reports ongoing low back pain with short spasms.    Pertinent History  anxiety    Limitations  Lifting;House hold activities;Standing    Patient Stated Goals  decrease pain, improve care giving activities for 23 year old    Currently in Pain?  Yes    Pain Score  8     Pain Location  Back    Pain Orientation  Mid;Lower    Pain Descriptors / Indicators  Discomfort    Pain Type  Chronic pain         [facility] PT Assessment - 01/02/19 0001      Assessment   Medical Diagnosis  Chronic midline low back pain wiht left-sided sciatica    Referring Provider (PT)  Darla Lesches, FNP    Next MD Visit  n/a    Prior Therapy  yes      Precautions   Precautions  None                   [facility] Adult PT Treatment/Exercise - 01/02/19 0001      Lumbar Exercises: Aerobic   Elliptical  L1 R1 x5 mins    Nustep  L4 x10 min      Lumbar Exercises: Machines for Strengthening   Cybex Lumbar Extension  50# 2x10 followed by 50#  flexion 2x10      Lumbar Exercises: Quadruped   [place]/Old Horse  10 reps      Shoulder Exercises: ROM/Strengthening   UBE (Upper Arm Bike)  120 RPM x6 min    Lat Pull  3 plate;20 reps    Lat Pull Limitations  standing    Cybex Press  3 plate;20 reps    Cybex Row  3 plate;20 reps    Wall Pushups  20 reps    Wall Pushups Limitations  --    Modified Plank  30 seconds;3 reps    Modified Plank Limitations  knees down      Modalities   Modalities  Electrical Stimulation;Moist Heat      Moist Heat Therapy   Number Minutes Moist Heat  10 Minutes    Moist Heat Location  Lumbar Spine      Electrical Stimulation   Electrical Stimulation Location  B thoracolumbar paraspinals    Electrical Stimulation Action  IFC    Electrical Stimulation Parameters  80-150 hz  x10 mins    Electrical Stimulation Goals  Pain                  PT Long Term Goals - 12/28/18 1446      PT LONG TERM GOAL #1   Title  Patient will be independent with HEP and its progression    Time  6    Period  Weeks    Status  On-going      PT LONG TERM GOAL #2   Title  Patient will demonstrate 4+/5 or greater LE strength bilaterally to improve stability during functional tasks    Time  6    Period  Weeks    Status  On-going      PT LONG TERM GOAL #3   Title  Patient will report ability to perform care giving activities and home activities with mid-low back pain less than or equal to 3/10.    Time  6    Period  Weeks    Status  On-going   5-6/10 12/28/18     PT LONG TERM GOAL #4   Title  Patient will demonstrate proper body mechanics for household chores, child care needs, and lifting to protect back during activities.    Time  6    Period  Weeks    Status  Achieved   12/28/18           Plan - 01/02/19 1708    Clinical Impression Statement  Patient responded well to therapy session with the addition of lumbar flexion and extension machine. Patient reported minimal increase of pain during  session. Patient required intermitent cuing for form and technique of exercises to which patient was able to demonstrate exercise properly. Patient noted with normal responses to modalities upon removal.    Personal Factors and Comorbidities  Time since onset of injury/illness/exacerbation    Examination-Activity Limitations  Bend;Caring for Others;Carry;Lift    Stability/Clinical Decision Making  Stable/Uncomplicated    Clinical Decision Making  Low    Rehab Potential  Good    PT Frequency  2x / week    PT Duration  6 weeks    PT Treatment/Interventions  ADLs/Self Care Home Management;Cryotherapy;Electrical Stimulation;Iontophoresis 4mg /ml Dexamethasone;Moist Heat;Traction;Ultrasound;Therapeutic exercise;Therapeutic activities;Gait training;Stair training;Neuromuscular re-education;Patient/family education;Passive range of motion;Vasopneumatic Device;Manual techniques;Dry needling    PT Next Visit Plan  cont with POC for Nustep, bike, or elliptical. postural exercises. modalities PRN for pain relief    PT Home Exercise Plan  see patient education section    Consulted and Agree with Plan of Care  Patient       Patient will benefit from skilled therapeutic intervention in order to improve the following deficits and impairments:  Decreased activity tolerance, Decreased range of motion, Difficulty walking, Pain, Decreased strength, Postural dysfunction  Visit Diagnosis: Chronic midline low back pain, unspecified whether sciatica present  Muscle weakness (generalized)  Pain in thoracic spine     Problem List Patient Active Problem List   Diagnosis Date Noted  . Chronic midline low back pain with left-sided sciatica 01/12/2018  . BMI 29.0-29.9,adult 01/12/2018    Gabriela Eves, PT, DPT 01/02/2019, 5:12 PM  [facility] [address] [place], [place], [address] Phone: [phone]   Fax:  [phone]  Name: Katrina Lin MRN:  [id number] Date of Birth: 1995-04-30

## 2019-01-04 ENCOUNTER — Encounter: Payer: BLUE CROSS/BLUE SHIELD | Admitting: Physical Therapy

## 2019-01-08 ENCOUNTER — Ambulatory Visit: Payer: BLUE CROSS/BLUE SHIELD | Admitting: Family

## 2019-01-08 ENCOUNTER — Encounter: Payer: Self-pay | Admitting: Family

## 2019-01-08 ENCOUNTER — Other Ambulatory Visit: Payer: Self-pay

## 2019-01-08 VITALS — BP 132/89 | HR 89 | Temp 98.6°F | Ht 61.0 in | Wt 147.0 lb

## 2019-01-08 DIAGNOSIS — M545 Low back pain, unspecified: Secondary | ICD-10-CM

## 2019-01-08 DIAGNOSIS — N3000 Acute cystitis without hematuria: Secondary | ICD-10-CM

## 2019-01-08 DIAGNOSIS — B3731 Acute candidiasis of vulva and vagina: Secondary | ICD-10-CM

## 2019-01-08 DIAGNOSIS — B373 Candidiasis of vulva and vagina: Secondary | ICD-10-CM | POA: Diagnosis not present

## 2019-01-08 LAB — MICROSCOPIC EXAMINATION: Renal Epithel, UA: NONE SEEN /hpf

## 2019-01-08 LAB — URINALYSIS, COMPLETE
Bilirubin, UA: NEGATIVE
Glucose, UA: NEGATIVE
Ketones, UA: NEGATIVE
Nitrite, UA: NEGATIVE
Protein,UA: NEGATIVE
Specific Gravity, UA: 1.025 (ref 1.005–1.030)
Urobilinogen, Ur: 0.2 mg/dL (ref 0.2–1.0)
pH, UA: 6 (ref 5.0–7.5)

## 2019-01-08 MED ORDER — CEPHALEXIN 500 MG PO CAPS: 500.0000 mg | ORAL_CAPSULE | Freq: Two times a day (BID) | ORAL | 0 refills | Status: DC

## 2019-01-08 MED ORDER — FLUCONAZOLE 150 MG PO TABS: 150.0000 mg | ORAL_TABLET | ORAL | 0 refills | Status: DC | PRN

## 2019-01-08 NOTE — Progress Notes (Signed)
   Subjective:    Patient ID: Katrina Lin, female    DOB: Jan 28, 1996, 23 y.o.   MRN: [id number]  Chief Complaint  Patient presents with  . Back Pain    Back Pain  Urinary Frequency  This is a new problem. The current episode started yesterday. The problem occurs every urination. The problem has been unchanged. The pain is at a severity of 6/10. The pain is mild. There has been no fever. Associated symptoms include flank pain, frequency and urgency. Pertinent negatives include no hematuria, hesitancy, nausea or vomiting. She has tried increased fluids for the symptoms. The treatment provided mild relief.      Review of Systems  Gastrointestinal: Negative for nausea and vomiting.  Genitourinary: Positive for flank pain, frequency and urgency. Negative for hematuria and hesitancy.  Musculoskeletal: Positive for back pain.  All other systems reviewed and are negative.      Objective:   Physical Exam Vitals signs reviewed.  Constitutional:      General: She is not in acute distress.    Appearance: She is well-developed.  HENT:     Head: Normocephalic and atraumatic.  Eyes:     Pupils: Pupils are equal, round, and reactive to light.  Neck:     Musculoskeletal: Normal range of motion and neck supple.     Thyroid: No thyromegaly.  Cardiovascular:     Rate and Rhythm: Normal rate and regular rhythm.     Heart sounds: Normal heart sounds. No murmur.  Pulmonary:     Effort: Pulmonary effort is normal. No respiratory distress.     Breath sounds: Normal breath sounds. No wheezing.  Abdominal:     General: Bowel sounds are normal. There is no distension.     Palpations: Abdomen is soft.     Tenderness: There is abdominal tenderness (mild pressure).  Musculoskeletal: Normal range of motion.        General: No tenderness.  Skin:    General: Skin is warm and dry.  Neurological:     Mental Status: She is alert and oriented to person, place, and time.     Cranial Nerves: No  cranial nerve deficit.     Deep Tendon Reflexes: Reflexes are normal and symmetric.  Psychiatric:        Behavior: Behavior normal.        Thought Content: Thought content normal.        Judgment: Judgment normal.      BP 132/89   Pulse 89   Temp 98.6 F (37 C) (Temporal)   Ht 5\' 1"  (1.549 m)   Wt 147 lb (66.7 kg)   SpO2 99%   BMI 27.78 kg/m       Assessment & Plan:  Loana Wafer comes in today with chief complaint of Back Pain   Diagnosis and orders addressed:  1. Acute low back pain without sciatica, unspecified back pain laterality - Urinalysis, Complete  2. Acute cystitis without hematuria Force fluids AZO over the counter X2 days RTO prn Culture pending - cephALEXin (KEFLEX) 500 MG capsule; Take 1 capsule (500 mg total) by mouth 2 (two) times daily.  Dispense: 14 capsule; Refill: 0  3. Vagina, candidiasis Keep clean and dry - fluconazole (DIFLUCAN) 150 MG tablet; Take 1 tablet (150 mg total) by mouth every three (3) days as needed.  Dispense: 3 tablet; Refill: 0   Evelina Dun, FNP

## 2019-01-08 NOTE — Patient Instructions (Signed)

## 2019-01-09 ENCOUNTER — Ambulatory Visit: Payer: BLUE CROSS/BLUE SHIELD | Admitting: Physical Therapy

## 2019-01-10 LAB — URINE CULTURE

## 2019-01-17 ENCOUNTER — Other Ambulatory Visit: Payer: Self-pay

## 2019-01-17 ENCOUNTER — Ambulatory Visit: Payer: BLUE CROSS/BLUE SHIELD | Attending: Family Medicine | Admitting: Physical Therapy

## 2019-01-17 ENCOUNTER — Encounter: Payer: Self-pay | Admitting: Physical Therapy

## 2019-01-17 DIAGNOSIS — M6281 Muscle weakness (generalized): Secondary | ICD-10-CM | POA: Insufficient documentation

## 2019-01-17 DIAGNOSIS — M545 Low back pain, unspecified: Secondary | ICD-10-CM

## 2019-01-17 DIAGNOSIS — G8929 Other chronic pain: Secondary | ICD-10-CM | POA: Diagnosis not present

## 2019-01-17 DIAGNOSIS — M546 Pain in thoracic spine: Secondary | ICD-10-CM | POA: Diagnosis not present

## 2019-01-17 NOTE — Therapy (Signed)
[facility] [place], [place], [address] Phone: [phone]   Fax:  [phone]  Physical Therapy Treatment  Patient Details  Name: Katrina Lin MRN: [id number] Date of Birth: 06/27/1995 Referring Provider (PT): Darla Lesches, FNP   Encounter Date: 01/17/2019  PT End of Session - 01/17/19 1340    Visit Number  9    Number of Visits  12    Date for PT Re-Evaluation  01/25/19    Authorization Type  Progress note every 10th visit    PT Start Time  1345    PT Stop Time  1438    PT Time Calculation (min)  53 min    Activity Tolerance  Patient tolerated treatment well    Behavior During Therapy  [facility] for tasks assessed/performed       Past Medical History:  Diagnosis Date  . Anxiety   . Panic attacks     History reviewed. No pertinent surgical history.  There were no vitals filed for this visit.  Subjective Assessment - 01/17/19 1340    Subjective  COVID 19 screening performed on patient upon arrival. Reports that she was having a lot of LBP last week but was actually side effects from UTI. Patient has finished antibiotic treatment.    Pertinent History  anxiety    Limitations  Lifting;House hold activities;Standing    Patient Stated Goals  decrease pain, improve care giving activities for 23 year old    Currently in Pain?  Yes    Pain Score  4     Pain Location  Back    Pain Orientation  Right;Mid    Pain Descriptors / Indicators  Discomfort    Pain Type  Chronic pain    Pain Onset  More than a month ago         [facility] PT Assessment - 01/17/19 0001      Assessment   Medical Diagnosis  Chronic midline low back pain wiht left-sided sciatica    Referring Provider (PT)  Darla Lesches, FNP    Next MD Visit  n/a    Prior Therapy  yes      Precautions   Precautions  None                   [facility] Adult PT Treatment/Exercise - 01/17/19 0001      Lumbar Exercises: Aerobic   Elliptical  L1 R1 x5 mins     UBE (Upper Arm Bike)  60 RPM x8 min      Lumbar Exercises: Machines for Strengthening   Cybex Lumbar Extension  50# 3x10 reps    Other Lumbar Machine Exercise  50# 3x10 reps for ab curls      Lumbar Exercises: Supine   Bridge  20 reps   with diagonal OH reach     Shoulder Exercises: Seated   Diagonals  Strengthening;Both;20 reps;Theraband    Theraband Level (Shoulder Diagonals)  Level 2 (Red)      Shoulder Exercises: ROM/Strengthening   Lat Pull  3 plate;20 reps    Lat Pull Limitations  standing    Cybex Row  3 plate;20 reps      Modalities   Modalities  Electrical Stimulation;Moist Heat      Moist Heat Therapy   Number Minutes Moist Heat  15 Minutes    Moist Heat Location  Lumbar Spine      Electrical Stimulation   Electrical Stimulation Location  R thoracolumbar region    Electrical  Stimulation Action  Pre-Mod    Electrical Stimulation Parameters  80-150 hz x15 min    Electrical Stimulation Goals  Pain                  PT Long Term Goals - 12/28/18 1446      PT LONG TERM GOAL #1   Title  Patient will be independent with HEP and its progression    Time  6    Period  Weeks    Status  On-going      PT LONG TERM GOAL #2   Title  Patient will demonstrate 4+/5 or greater LE strength bilaterally to improve stability during functional tasks    Time  6    Period  Weeks    Status  On-going      PT LONG TERM GOAL #3   Title  Patient will report ability to perform care giving activities and home activities with mid-low back pain less than or equal to 3/10.    Time  6    Period  Weeks    Status  On-going   5-6/10 12/28/18     PT LONG TERM GOAL #4   Title  Patient will demonstrate proper body mechanics for household chores, child care needs, and lifting to protect back during activities.    Time  6    Period  Weeks    Status  Achieved   12/28/18           Plan - 01/17/19 1431    Clinical Impression Statement  Patient presented in clinic with  4/10 mid back pain which was presenting mostly in R thoracolumbar region. Patient able to complete all exercises with greater posture emphasis. More demo and VCs may be required to correct bridges with OH reach. All exercises were instructed with postural instruction. Normal modalities response noted following removal of the modalities.    Personal Factors and Comorbidities  Time since onset of injury/illness/exacerbation    Examination-Activity Limitations  Bend;Caring for Others;Carry;Lift    Stability/Clinical Decision Making  Stable/Uncomplicated    Rehab Potential  Good    PT Frequency  2x / week    PT Duration  6 weeks    PT Treatment/Interventions  ADLs/Self Care Home Management;Cryotherapy;Electrical Stimulation;Iontophoresis 4mg /ml Dexamethasone;Moist Heat;Traction;Ultrasound;Therapeutic exercise;Therapeutic activities;Gait training;Stair training;Neuromuscular re-education;Patient/family education;Passive range of motion;Vasopneumatic Device;Manual techniques;Dry needling    PT Next Visit Plan  cont with POC for Nustep, bike, or elliptical. postural exercises. modalities PRN for pain relief    PT Home Exercise Plan  see patient education section    Consulted and Agree with Plan of Care  Patient       Patient will benefit from skilled therapeutic intervention in order to improve the following deficits and impairments:  Decreased activity tolerance, Decreased range of motion, Difficulty walking, Pain, Decreased strength, Postural dysfunction  Visit Diagnosis: Chronic midline low back pain, unspecified whether sciatica present  Muscle weakness (generalized)     Problem List Patient Active Problem List   Diagnosis Date Noted  . Chronic midline low back pain with left-sided sciatica 01/12/2018  . BMI 29.0-29.9,adult 01/12/2018    Standley Brooking, PTA 01/17/2019, 2:42 PM  [facility] [place], [place], [address] Phone:  [phone]   Fax:  [phone]  Name: Jacqui Irias MRN: [id number] Date of Birth: 11/12/1995

## 2019-01-18 ENCOUNTER — Ambulatory Visit: Payer: BLUE CROSS/BLUE SHIELD | Admitting: Physical Therapy

## 2019-01-18 ENCOUNTER — Encounter: Payer: Self-pay | Admitting: Physical Therapy

## 2019-01-18 DIAGNOSIS — G8929 Other chronic pain: Secondary | ICD-10-CM | POA: Diagnosis not present

## 2019-01-18 DIAGNOSIS — M545 Low back pain, unspecified: Secondary | ICD-10-CM

## 2019-01-18 DIAGNOSIS — M6281 Muscle weakness (generalized): Secondary | ICD-10-CM

## 2019-01-18 DIAGNOSIS — M546 Pain in thoracic spine: Secondary | ICD-10-CM | POA: Diagnosis not present

## 2019-01-18 NOTE — Therapy (Signed)
[facility] [place], [place], [address] Phone: [phone]   Fax:  [phone]  Physical Therapy Treatment  Progress Note Reporting Period 12/07/2018 to 01/18/2019  See note below for Objective Data and Assessment of Progress/Goals. Patient is progressing towards goals and has made significant progress since start of PT.      Patient Details  Name: Katrina Lin MRN: [id number] Date of Birth: 05-04-1995 Referring Provider (PT): Darla Lesches, FNP   Encounter Date: 01/18/2019  PT End of Session - 01/18/19 1653    Visit Number  10    Number of Visits  12    Date for PT Re-Evaluation  01/25/19    Authorization Type  Progress note every 10th visit    PT Start Time  1646    PT Stop Time  1733    PT Time Calculation (min)  47 min    Activity Tolerance  Patient tolerated treatment well    Behavior During Therapy  [facility] for tasks assessed/performed       Past Medical History:  Diagnosis Date  . Anxiety   . Panic attacks     History reviewed. No pertinent surgical history.  There were no vitals filed for this visit.  Subjective Assessment - 01/18/19 1646    Subjective  COVID 19 screening performed on patient upon arrival. Reports pain mostly in the mornings but she sleeps with her 24 year old daughter. Reports that pain dissipates quickly in the mornings.    Pertinent History  anxiety    Limitations  Lifting;House hold activities;Standing    Patient Stated Goals  decrease pain, improve care giving activities for 24 year old    Currently in Pain?  No/denies         [facility] PT Assessment - 01/18/19 0001      Assessment   Medical Diagnosis  Chronic midline low back pain wiht left-sided sciatica    Referring Provider (PT)  Darla Lesches, FNP    Next MD Visit  n/a    Prior Therapy  yes      Precautions   Precautions  None                   [facility] Adult PT Treatment/Exercise - 01/18/19 0001      Therapeutic Activites    Therapeutic Activities  Lifting    Lifting  Lifting/squatting technique from low plinth 14# box x10 reps      Lumbar Exercises: Aerobic   Elliptical  L2, R2 x5 min    UBE (Upper Arm Bike)  60 RPM x8 min      Lumbar Exercises: Machines for Strengthening   Cybex Lumbar Extension  50# 3x10 reps    Other Lumbar Machine Exercise  50# 3x10 reps for ab curls      Lumbar Exercises: Seated   Other Seated Lumbar Exercises  Kneeling B upper chop x15 reps green theraband      Shoulder Exercises: ROM/Strengthening   Lat Pull  3 plate;20 reps    Lat Pull Limitations  standing    Cybex Row  3 plate;20 reps      Modalities   Modalities  Electrical Stimulation;Moist Heat      Moist Heat Therapy   Number Minutes Moist Heat  10 Minutes    Moist Heat Location  Lumbar Spine      Electrical Stimulation   Electrical Stimulation Location  B low back    Electrical Stimulation Action  Pre-Mod  Electrical Stimulation Parameters  80-150 hz x41min    Electrical Stimulation Goals  Pain                  PT Long Term Goals - 01/18/19 1712      PT LONG TERM GOAL #1   Title  Patient will be independent with HEP and its progression    Time  6    Period  Weeks    Status  Achieved      PT LONG TERM GOAL #2   Title  Patient will demonstrate 4+/5 or greater LE strength bilaterally to improve stability during functional tasks    Time  6    Period  Weeks    Status  On-going      PT LONG TERM GOAL #3   Title  Patient will report ability to perform care giving activities and home activities with mid-low back pain less than or equal to 3/10.    Time  6    Period  Weeks    Status  Partially Met   5-6/10 12/28/18     PT LONG TERM GOAL #4   Title  Patient will demonstrate proper body mechanics for household chores, child care needs, and lifting to protect back during activities.    Time  6    Period  Weeks    Status  Achieved   12/28/18           Plan -  01/18/19 1722    Clinical Impression Statement  Patient presented in clinic with reports of overall improvement. Most pain occurs intermittantly with child care such as picking her daughter up or during baths when she is leaning over the side of the bath. Patient able to complete all strengthening well with core activation VCs. Patient instructed with correct lifting technique using 14# box. Minimal correction required for rounded back during squatting but able to correct after VCs. Normal modalities response noted following removal of the modalities.    Personal Factors and Comorbidities  Time since onset of injury/illness/exacerbation    Examination-Activity Limitations  Bend;Caring for Others;Carry;Lift    Stability/Clinical Decision Making  Stable/Uncomplicated    Rehab Potential  Good    PT Frequency  2x / week    PT Duration  6 weeks    PT Treatment/Interventions  ADLs/Self Care Home Management;Cryotherapy;Electrical Stimulation;Iontophoresis 446mml Dexamethasone;Moist Heat;Traction;Ultrasound;Therapeutic exercise;Therapeutic activities;Gait training;Stair training;Neuromuscular re-education;Patient/family education;Passive range of motion;Vasopneumatic Device;Manual techniques;Dry needling    PT Next Visit Plan  cont with POC for Nustep, bike, or elliptical. postural exercises. modalities PRN for pain relief    PT Home Exercise Plan  see patient education section    Consulted and Agree with Plan of Care  Patient       Patient will benefit from skilled therapeutic intervention in order to improve the following deficits and impairments:  Decreased activity tolerance, Decreased range of motion, Difficulty walking, Pain, Decreased strength, Postural dysfunction  Visit Diagnosis: Chronic midline low back pain, unspecified whether sciatica present  Muscle weakness (generalized)     Problem List Patient Active Problem List   Diagnosis Date Noted  . Chronic midline low back pain with  left-sided sciatica 01/12/2018  . BMI 29.0-29.9,adult 01/12/2018    KeStandley BrookingPTA 01/18/2019, 5:45 PM  [facility] [address]: [phone] Fax:  [phone]: CoLaverta HarnischRN: [id number] of Birth: [birth year]

## 2019-01-23 ENCOUNTER — Other Ambulatory Visit: Payer: Self-pay

## 2019-01-23 ENCOUNTER — Encounter: Payer: Self-pay | Admitting: Physical Therapy

## 2019-01-23 ENCOUNTER — Ambulatory Visit: Payer: BLUE CROSS/BLUE SHIELD | Admitting: Physical Therapy

## 2019-01-23 DIAGNOSIS — M545 Low back pain, unspecified: Secondary | ICD-10-CM

## 2019-01-23 DIAGNOSIS — M546 Pain in thoracic spine: Secondary | ICD-10-CM

## 2019-01-23 DIAGNOSIS — M6281 Muscle weakness (generalized): Secondary | ICD-10-CM

## 2019-01-23 DIAGNOSIS — G8929 Other chronic pain: Secondary | ICD-10-CM | POA: Diagnosis not present

## 2019-01-23 NOTE — Patient Instructions (Signed)

## 2019-01-23 NOTE — Therapy (Signed)
[facility] [place], [place], [address] Phone: [phone]   Fax:  [phone]  Physical Therapy Treatment  Patient Details  Name: Katrina Lin MRN: [id number] Date of Birth: 03-17-1995 Referring Provider (PT): Darla Lesches, FNP   Encounter Date: 01/23/2019  PT End of Session - 01/23/19 1609    Visit Number  11    Number of Visits  12    Date for PT Re-Evaluation  01/25/19    Authorization Type  Progress note every 10th visit    PT Start Time  1559    PT Stop Time  1658    PT Time Calculation (min)  59 min    Activity Tolerance  Patient tolerated treatment well    Behavior During Therapy  [facility] for tasks assessed/performed       Past Medical History:  Diagnosis Date  . Anxiety   . Panic attacks     History reviewed. No pertinent surgical history.  There were no vitals filed for this visit.  Subjective Assessment - 01/23/19 1602    Subjective  COVID 19 screening performed on patient upon arrival. Reports a little back pain today.    Pertinent History  anxiety    Limitations  Lifting;House hold activities;Standing    Patient Stated Goals  decrease pain, improve care giving activities for 23 year old    Currently in Pain?  Yes    Pain Score  6     Pain Location  Back    Pain Orientation  Right;Left;Mid;Lower    Pain Descriptors / Indicators  Discomfort    Pain Type  Chronic pain    Pain Onset  More than a month ago    Pain Frequency  Constant         [facility] PT Assessment - 01/23/19 0001      Assessment   Medical Diagnosis  Chronic midline low back pain wiht left-sided sciatica    Referring Provider (PT)  Darla Lesches, FNP    Next MD Visit  n/a    Prior Therapy  yes      Precautions   Precautions  None      Restrictions   Weight Bearing Restrictions  No                   [facility] Adult PT Treatment/Exercise - 01/23/19 0001      Therapeutic Activites    Therapeutic Activities  Lifting     Lifting  Lifting/squatting technique from low plinth and from floor x20 reps with 24#      Lumbar Exercises: Aerobic   Elliptical  L3, R3 x5 min    UBE (Upper Arm Bike)  60 RPM x8 min      Lumbar Exercises: Machines for Strengthening   Cybex Lumbar Extension  50# 3x10 reps    Other Lumbar Machine Exercise  50# 3x10 reps for ab curls      Lumbar Exercises: Seated   Other Seated Lumbar Exercises  Kneeling B upper chop x15 reps green theraband      Lumbar Exercises: Supine   Bridge  20 reps;3 seconds   with 4# medicine ball D2 reach bilaterally     Shoulder Exercises: ROM/Strengthening   Lat Pull  3 plate;20 reps    Lat Pull Limitations  standing    Cybex Row  3 plate;20 reps      Modalities   Modalities  Electrical Stimulation;Moist Heat      Moist Heat Therapy  Number Minutes Moist Heat  10 Minutes    Moist Heat Location  Lumbar Spine      Electrical Stimulation   Electrical Stimulation Location  B low back    Electrical Stimulation Action  Pre-Mod    Electrical Stimulation Parameters  80-150 hz x10 min    Electrical Stimulation Goals  Pain             PT Education - 01/23/19 1704    Education Details  HEP- horizontal abduction, D2 strengthening, bridges, wall squat, thoracic extension; posture and ADLs handout    Person(s) Educated  Patient    Methods  Explanation;Handout;Demonstration    Comprehension  Verbalized understanding          PT Long Term Goals - 01/18/19 1712      PT LONG TERM GOAL #1   Title  Patient will be independent with HEP and its progression    Time  6    Period  Weeks    Status  Achieved      PT LONG TERM GOAL #2   Title  Patient will demonstrate 4+/5 or greater LE strength bilaterally to improve stability during functional tasks    Time  6    Period  Weeks    Status  On-going      PT LONG TERM GOAL #3   Title  Patient will report ability to perform care giving activities and home activities with mid-low back pain less than  or equal to 3/10.    Time  6    Period  Weeks    Status  Partially Met   5-6/10 12/28/18     PT LONG TERM GOAL #4   Title  Patient will demonstrate proper body mechanics for household chores, child care needs, and lifting to protect back during activities.    Time  6    Period  Weeks    Status  Achieved   12/28/18           Plan - 01/23/19 1712    Clinical Impression Statement  Patient presented in clinic with reports of more thoracolumbar region. Patient able to demonstrate good overall technique with minimal correction of therex technique. More education provided today for bridge with OH reach and for lifting technique. Patient heavily encouraged to continue lifting daughter with proper technique which was reinforced today. New HEP provided for more lumbar/LE/thoracic strengthening along with posture and ADLs handout. Normal modalities response noted following removal of the modalities.    Personal Factors and Comorbidities  Time since onset of injury/illness/exacerbation    Examination-Activity Limitations  Bend;Caring for Others;Carry;Lift    Stability/Clinical Decision Making  Stable/Uncomplicated    Rehab Potential  Good    PT Frequency  2x / week    PT Duration  6 weeks    PT Treatment/Interventions  ADLs/Self Care Home Management;Cryotherapy;Electrical Stimulation;Iontophoresis 42m/ml Dexamethasone;Moist Heat;Traction;Ultrasound;Therapeutic exercise;Therapeutic activities;Gait training;Stair training;Neuromuscular re-education;Patient/family education;Passive range of motion;Vasopneumatic Device;Manual techniques;Dry needling    PT Next Visit Plan  D/C summary required. Provided red theraband for HEP.    PT Home Exercise Plan  see patient education section    Consulted and Agree with Plan of Care  Patient       Patient will benefit from skilled therapeutic intervention in order to improve the following deficits and impairments:  Decreased activity tolerance, Decreased range  of motion, Difficulty walking, Pain, Decreased strength, Postural dysfunction  Visit Diagnosis: Chronic midline low back pain, unspecified whether sciatica present  Muscle weakness (generalized)  Pain in thoracic spine     Problem List Patient Active Problem List   Diagnosis Date Noted  . Chronic midline low back pain with left-sided sciatica 01/12/2018  . BMI 29.0-29.9,adult 01/12/2018    Standley Brooking, PTA 01/23/2019, 5:22 PM  [facility] [place], [place], [address] Phone: [phone]   Fax:  [phone]  Name: Katrina Lin MRN: [id number] Date of Birth: September 21, 1995

## 2019-01-24 ENCOUNTER — Ambulatory Visit: Payer: BLUE CROSS/BLUE SHIELD | Admitting: Physical Therapy

## 2019-01-24 ENCOUNTER — Encounter: Payer: Self-pay | Admitting: Physical Therapy

## 2019-01-24 DIAGNOSIS — M545 Low back pain, unspecified: Secondary | ICD-10-CM

## 2019-01-24 DIAGNOSIS — M6281 Muscle weakness (generalized): Secondary | ICD-10-CM

## 2019-01-24 DIAGNOSIS — M546 Pain in thoracic spine: Secondary | ICD-10-CM

## 2019-01-24 DIAGNOSIS — G8929 Other chronic pain: Secondary | ICD-10-CM | POA: Diagnosis not present

## 2019-01-24 NOTE — Therapy (Signed)
[facility] [place], [place], [address] Phone: [phone]   Fax:  [phone]  Physical Therapy Treatment PHYSICAL THERAPY DISCHARGE SUMMARY  Visits from Start of Care: 12  Current functional level related to goals / functional outcomes: See below   Remaining deficits: See goals   Education / Equipment: HEP  Plan: Patient agrees to discharge.  Patient goals were partially met. Patient is being discharged due to being pleased with the current functional level.  ?????  Gabriela Eves, PT, DPT   Patient Details  Name: Katrina Lin MRN: [id number] Date of Birth: 02-08-1996 Referring Provider (PT): Darla Lesches, FNP   Encounter Date: 01/24/2019  PT End of Session - 01/24/19 1344    Visit Number  12    Number of Visits  12    Date for PT Re-Evaluation  01/25/19    Authorization Type  Progress note every 10th visit    PT Start Time  1343    PT Stop Time  1432    PT Time Calculation (min)  49 min    Activity Tolerance  Patient tolerated treatment well    Behavior During Therapy  [facility] for tasks assessed/performed       Past Medical History:  Diagnosis Date  . Anxiety   . Panic attacks     History reviewed. No pertinent surgical history.  There were no vitals filed for this visit.  Subjective Assessment - 01/24/19 1344    Subjective  COVID 19 screening performed on patient upon arrival. Reports her back feeling stronger and better.    Pertinent History  anxiety    Limitations  Lifting;House hold activities;Standing    Patient Stated Goals  decrease pain, improve care giving activities for 23 year old    Currently in Pain?  No/denies         [facility] PT Assessment - 01/24/19 0001      Assessment   Medical Diagnosis  Chronic midline low back pain wiht left-sided sciatica    Referring Provider (PT)  Darla Lesches, FNP    Next MD Visit  n/a    Prior Therapy  yes      Precautions   Precautions  None       Restrictions   Weight Bearing Restrictions  No      ROM / Strength   AROM / PROM / Strength  Strength      Strength   Overall Strength  Within functional limits for tasks performed    Strength Assessment Site  Hip    Right/Left Hip  Right;Left    Right Hip Flexion  4+/5    Right Hip ABduction  4+/5    Left Hip Flexion  4+/5    Left Hip ABduction  4+/5                   [facility] Adult PT Treatment/Exercise - 01/24/19 0001      Lumbar Exercises: Aerobic   Elliptical  L3, R3 x5 min    UBE (Upper Arm Bike)  30 RPM x8 min      Lumbar Exercises: Machines for Strengthening   Cybex Lumbar Extension  50# 3x10 reps    Other Lumbar Machine Exercise  50# 3x10 reps for ab curls      Lumbar Exercises: Standing   Wall Slides  15 reps;3 seconds      Shoulder Exercises: Standing   Horizontal ABduction  Strengthening;Both;20 reps;Theraband    Theraband Level (Shoulder Horizontal ABduction)  Level 2 (Red)      Shoulder Exercises: ROM/Strengthening   Lat Pull  3 plate;20 reps    Lat Pull Limitations  standing    Cybex Row  3 plate;20 reps      Modalities   Modalities  Electrical Stimulation;Moist Heat      Moist Heat Therapy   Number Minutes Moist Heat  10 Minutes    Moist Heat Location  Lumbar Spine      Electrical Stimulation   Electrical Stimulation Location  B low back    Electrical Stimulation Action  Pre-Mod    Electrical Stimulation Parameters  80-150 hz x10 min    Electrical Stimulation Goals  Pain                  PT Long Term Goals - 01/24/19 1439      PT LONG TERM GOAL #1   Title  Patient will be independent with HEP and its progression    Time  6    Period  Weeks    Status  Achieved      PT LONG TERM GOAL #2   Title  Patient will demonstrate 4+/5 or greater LE strength bilaterally to improve stability during functional tasks    Time  6    Period  Weeks    Status  Achieved      PT LONG TERM GOAL #3   Title  Patient will report  ability to perform care giving activities and home activities with mid-low back pain less than or equal to 3/10.    Time  6    Period  Weeks    Status  Partially Met   5-6/10 12/28/18     PT LONG TERM GOAL #4   Title  Patient will demonstrate proper body mechanics for household chores, child care needs, and lifting to protect back during activities.    Time  6    Period  Weeks    Status  Achieved   12/28/18           Plan - 01/24/19 1441    Clinical Impression Statement  Patient presented in clinic and was agreeable to D/C to HEP until gym setting is safer. Patient provided new red theraband for HEP use. Patient guided through therex with VCs for core activation with each exercise. B hip MMT assessed as 4+/5. Met all goals except for partially with ADLs. Patient heavily encouraged to complete HEP as directed. Normal modalities response noted following removal of the modalities.    Personal Factors and Comorbidities  Time since onset of injury/illness/exacerbation    Examination-Activity Limitations  Bend;Caring for Others;Carry;Lift    Stability/Clinical Decision Making  Stable/Uncomplicated    Rehab Potential  Good    PT Frequency  2x / week    PT Duration  6 weeks    PT Treatment/Interventions  ADLs/Self Care Home Management;Cryotherapy;Electrical Stimulation;Iontophoresis 63m/ml Dexamethasone;Moist Heat;Traction;Ultrasound;Therapeutic exercise;Therapeutic activities;Gait training;Stair training;Neuromuscular re-education;Patient/family education;Passive range of motion;Vasopneumatic Device;Manual techniques;Dry needling    PT Next Visit Plan  D/C summary required.    PT Home Exercise Plan  see patient education section    Consulted and Agree with Plan of Care  Patient       Patient will benefit from skilled therapeutic intervention in order to improve the following deficits and impairments:  Decreased activity tolerance, Decreased range of motion, Difficulty walking, Pain,  Decreased strength, Postural dysfunction  Visit Diagnosis: Chronic midline low back pain, unspecified whether sciatica present  Muscle weakness (generalized)  Pain in thoracic spine     Problem List Patient Active Problem List   Diagnosis Date Noted  . Chronic midline low back pain with left-sided sciatica 01/12/2018  . BMI 29.0-29.9,adult 01/12/2018   Standley Brooking, PTA 01/24/19 2:46 PM   [facility] [place], [place], [address] Phone: [phone]   Fax:  [phone]  Name: Jehieli Brassell MRN: [id number] Date of Birth: 1995-05-01

## 2019-01-30 DIAGNOSIS — Z118 Encounter for screening for other infectious and parasitic diseases: Secondary | ICD-10-CM | POA: Diagnosis not present

## 2019-01-30 DIAGNOSIS — Z3041 Encounter for surveillance of contraceptive pills: Secondary | ICD-10-CM | POA: Diagnosis not present

## 2019-01-30 DIAGNOSIS — N760 Acute vaginitis: Secondary | ICD-10-CM | POA: Diagnosis not present

## 2019-02-26 DIAGNOSIS — Z01419 Encounter for gynecological examination (general) (routine) without abnormal findings: Secondary | ICD-10-CM | POA: Diagnosis not present

## 2019-02-26 DIAGNOSIS — Z6828 Body mass index (BMI) 28.0-28.9, adult: Secondary | ICD-10-CM | POA: Diagnosis not present

## 2019-03-05 ENCOUNTER — Ambulatory Visit: Payer: BLUE CROSS/BLUE SHIELD | Attending: Internal Medicine

## 2019-03-05 ENCOUNTER — Other Ambulatory Visit: Payer: Self-pay

## 2019-03-05 DIAGNOSIS — Z20822 Contact with and (suspected) exposure to covid-19: Secondary | ICD-10-CM

## 2019-03-05 DIAGNOSIS — Z20828 Contact with and (suspected) exposure to other viral communicable diseases: Secondary | ICD-10-CM | POA: Diagnosis not present

## 2019-03-06 LAB — NOVEL CORONAVIRUS, NAA: SARS-CoV-2, NAA: NOT DETECTED

## 2019-03-15 DIAGNOSIS — Z124 Encounter for screening for malignant neoplasm of cervix: Secondary | ICD-10-CM | POA: Diagnosis not present

## 2019-03-15 DIAGNOSIS — N390 Urinary tract infection, site not specified: Secondary | ICD-10-CM | POA: Diagnosis not present

## 2019-03-15 DIAGNOSIS — R300 Dysuria: Secondary | ICD-10-CM | POA: Diagnosis not present

## 2019-03-15 DIAGNOSIS — M549 Dorsalgia, unspecified: Secondary | ICD-10-CM | POA: Diagnosis not present

## 2019-03-28 ENCOUNTER — Other Ambulatory Visit: Payer: Self-pay

## 2019-03-28 ENCOUNTER — Ambulatory Visit: Payer: BLUE CROSS/BLUE SHIELD | Admitting: Family Medicine

## 2019-03-28 ENCOUNTER — Encounter: Payer: Self-pay | Admitting: Family Medicine

## 2019-03-28 VITALS — BP 132/84 | HR 74 | Temp 97.8°F | Resp 20 | Ht 61.0 in | Wt 147.0 lb

## 2019-03-28 DIAGNOSIS — H6691 Otitis media, unspecified, right ear: Secondary | ICD-10-CM | POA: Diagnosis not present

## 2019-03-28 DIAGNOSIS — R42 Dizziness and giddiness: Secondary | ICD-10-CM | POA: Diagnosis not present

## 2019-03-28 MED ORDER — AMOXICILLIN-POT CLAVULANATE 875-125 MG PO TABS: 1.0000 | ORAL_TABLET | Freq: Two times a day (BID) | ORAL | 0 refills | Status: DC

## 2019-03-28 NOTE — Progress Notes (Signed)
Subjective:  Patient ID: Katrina Lin, female    DOB: October 27, 1995, 24 y.o.   MRN: [id number]  Patient Care Team: Baruch Gouty, FNP as PCP - General (Family Medicine)   Chief Complaint:  Dizziness   HPI: Katrina Lin is a 24 y.o. female presenting on 03/28/2019 for Dizziness   Pt presents today with complaints of intermittent dizziness and pressure and pain in her right ear. Pt states this started about 4 days ago. States the dizziness is worse with position changes and standing up.  She states she is currently on her menstrual cycle, denies heavy bleeding or clots.  No palpitations, shortness of breath, chest pain, wheezing, or syncope.  States she does have a lot of pressure and pain in her right ear.  States this has been ongoing for 4 days.  She has not tried anything for the symptoms.  Dizziness This is a new problem. The current episode started in the past 7 days. The problem occurs intermittently. The problem has been waxing and waning. Associated symptoms include swollen glands. Pertinent negatives include no abdominal pain, anorexia, arthralgias, change in bowel habit, chest pain, chills, congestion, coughing, diaphoresis, fatigue, fever, headaches, joint swelling, myalgias, nausea, neck pain, numbness, rash, sore throat, urinary symptoms, vertigo, visual change, vomiting or weakness. The symptoms are aggravated by bending, standing and twisting. She has tried nothing for the symptoms.      Relevant past medical, surgical, family, and social history reviewed and updated as indicated.  Allergies and medications reviewed and updated. Date reviewed: Chart in Epic.   Past Medical History:  Diagnosis Date  . Anxiety   . Panic attacks     History reviewed. No pertinent surgical history.  Social History   Socioeconomic History  . Marital status: Single    Spouse name: Not on file  . Number of children: Not on file  . Years of education: Not on file  . Highest  education level: Not on file  Occupational History  . Not on file  Tobacco Use  . Smoking status: Never Smoker  . Smokeless tobacco: Never Used  Substance and Sexual Activity  . Alcohol use: Not Currently  . Drug use: Never  . Sexual activity: Not on file  Other Topics Concern  . Not on file  Social History Narrative  . Not on file   Social Determinants of Health   Financial Resource Strain:   . Difficulty of Paying Living Expenses: Not on file  Food Insecurity:   . Worried About Charity fundraiser in the Last Year: Not on file  . Ran Out of Food in the Last Year: Not on file  Transportation Needs:   . Lack of Transportation (Medical): Not on file  . Lack of Transportation (Non-Medical): Not on file  Physical Activity:   . Days of Exercise per Week: Not on file  . Minutes of Exercise per Session: Not on file  Stress:   . Feeling of Stress : Not on file  Social Connections:   . Frequency of Communication with Friends and Family: Not on file  . Frequency of Social Gatherings with Friends and Family: Not on file  . Attends Religious Services: Not on file  . Active Member of Clubs or Organizations: Not on file  . Attends Archivist Meetings: Not on file  . Marital Status: Not on file  Intimate Partner Violence:   . Fear of Current or Ex-Partner: Not on file  . Emotionally Abused:  Not on file  . Physically Abused: Not on file  . Sexually Abused: Not on file    Outpatient Encounter Medications as of 03/28/2019  Medication Sig  . naproxen (NAPROSYN) 500 MG tablet Take 1 tablet (500 mg total) by mouth 2 (two) times daily with a meal.  . Norethin Ace-Eth Estrad-FE (MINASTRIN 24 FE) 1-20 MG-MCG(24) CHEW Chew 1 tablet by mouth daily. Dispense brand name please  . amoxicillin-clavulanate (AUGMENTIN) 875-125 MG tablet Take 1 tablet by mouth 2 (two) times daily.  . [DISCONTINUED] cephALEXin (KEFLEX) 500 MG capsule Take 1 capsule (500 mg total) by mouth 2 (two) times  daily.  . [DISCONTINUED] fluconazole (DIFLUCAN) 150 MG tablet Take 1 tablet (150 mg total) by mouth every three (3) days as needed.   No facility-administered encounter medications on file as of 03/28/2019.    No Known Allergies  Review of Systems  Constitutional: Negative for activity change, appetite change, chills, diaphoresis, fatigue, fever and unexpected weight change.  HENT: Positive for ear pain. Negative for congestion and sore throat.   Eyes: Negative.  Negative for photophobia and visual disturbance.  Respiratory: Negative for cough, chest tightness, shortness of breath, wheezing and stridor.   Cardiovascular: Negative for chest pain, palpitations and leg swelling.  Gastrointestinal: Negative for abdominal pain, anorexia, blood in stool, change in bowel habit, constipation, diarrhea, nausea and vomiting.  Endocrine: Negative.   Genitourinary: Negative for decreased urine volume, difficulty urinating, dysuria, frequency and urgency.  Musculoskeletal: Negative for arthralgias, joint swelling, myalgias and neck pain.  Skin: Negative.  Negative for rash.  Allergic/Immunologic: Negative.   Neurological: Positive for dizziness. Negative for vertigo, tremors, seizures, syncope, facial asymmetry, speech difficulty, weakness, light-headedness, numbness and headaches.  Hematological: Positive for adenopathy.  Psychiatric/Behavioral: Negative for confusion, hallucinations, sleep disturbance and suicidal ideas.  All other systems reviewed and are negative.       Objective:  BP 132/84   Pulse 74   Temp 97.8 F (36.6 C)   Resp 20   Ht 5\' 1"  (1.549 m)   Wt 147 lb (66.7 kg)   LMP 03/21/2019   SpO2 98%   BMI 27.78 kg/m    Wt Readings from Last 3 Encounters:  03/28/19 147 lb (66.7 kg)  01/08/19 147 lb (66.7 kg)  03/09/18 149 lb 9.6 oz (67.9 kg)    Physical Exam Vitals and nursing note reviewed.  Constitutional:      General: She is not in acute distress.    Appearance:  Normal appearance. She is well-developed, well-groomed and overweight. She is not ill-appearing, toxic-appearing or diaphoretic.  HENT:     Head: Normocephalic and atraumatic.     Jaw: There is normal jaw occlusion.     Right Ear: Hearing, ear canal and external ear normal. Tympanic membrane is erythematous and bulging. Tympanic membrane is not perforated.     Left Ear: Hearing, tympanic membrane, ear canal and external ear normal.     Nose: Nose normal.     Mouth/Throat:     Lips: Pink.     Mouth: Mucous membranes are moist.     Pharynx: Oropharynx is clear. Uvula midline.  Eyes:     General: Lids are normal.     Extraocular Movements: Extraocular movements intact.     Conjunctiva/sclera: Conjunctivae normal.     Pupils: Pupils are equal, round, and reactive to light.  Neck:     Thyroid: No thyroid mass, thyromegaly or thyroid tenderness.     Vascular: No carotid bruit or  JVD.     Trachea: Trachea and phonation normal.  Cardiovascular:     Rate and Rhythm: Normal rate and regular rhythm.     Chest Wall: PMI is not displaced.     Pulses: Normal pulses.     Heart sounds: Normal heart sounds. No murmur. No friction rub. No gallop.      Comments: Orthostatic VS unremarkable Pulmonary:     Effort: Pulmonary effort is normal. No respiratory distress.     Breath sounds: Normal breath sounds. No wheezing.  Abdominal:     General: Bowel sounds are normal. There is no distension or abdominal bruit.     Palpations: Abdomen is soft. There is no hepatomegaly or splenomegaly.     Tenderness: There is no abdominal tenderness. There is no right CVA tenderness or left CVA tenderness.     Hernia: No hernia is present.  Musculoskeletal:        General: Normal range of motion.     Cervical back: Normal range of motion and neck supple.     Right lower leg: No edema.     Left lower leg: No edema.  Lymphadenopathy:     Cervical: No cervical adenopathy.  Skin:    General: Skin is warm and dry.      Capillary Refill: Capillary refill takes less than 2 seconds.     Coloration: Skin is not cyanotic, jaundiced or pale.     Findings: No rash.  Neurological:     General: No focal deficit present.     Mental Status: She is alert and oriented to person, place, and time.     Cranial Nerves: Cranial nerves are intact. No cranial nerve deficit.     Sensory: Sensation is intact. No sensory deficit.     Motor: Motor function is intact. No weakness.     Coordination: Coordination is intact. Romberg sign negative. Coordination normal.     Gait: Gait is intact. Gait normal.     Deep Tendon Reflexes: Reflexes are normal and symmetric. Reflexes normal.  Psychiatric:        Attention and Perception: Attention and perception normal.        Mood and Affect: Mood and affect normal.        Speech: Speech normal.        Behavior: Behavior normal. Behavior is cooperative.        Thought Content: Thought content normal.        Cognition and Memory: Cognition and memory normal.        Judgment: Judgment normal.     Results for orders placed or performed in visit on 03/05/19  Novel Coronavirus, NAA ([facility])   Specimen: Nasopharyngeal(NP) swabs in vial transport medium   NASOPHARYNGE  TESTING  Result Value Ref Range   SARS-CoV-2, NAA Not Detected Not Detected       Pertinent labs & imaging results that were available during my care of the patient were reviewed by me and considered in my medical decision making.  Assessment & Plan:  Keanu was seen today for dizziness.  Diagnoses and all orders for this visit:  Dizziness Intermittent dizziness with standing and quick positional changes.  Orthostatic vital signs negative in office.  Neurological exam unremarkable.  Will initiate Flonase daily to see if beneficial as patient has acute otitis of the right ear.  Adequate hydration and slow position changes discussed in detail.  Patient aware to report any new, worsening, or persistent  symptoms.  Acute otitis media,  right Erythematous and bulging TM with anterior cervical adenopathy.  Will treat with below.  Tylenol as needed for fever and pain control.  Report any new, worsening, or persistent symptoms.  If no improvement in 2 weeks, return to office for evaluation. -     amoxicillin-clavulanate (AUGMENTIN) 875-125 MG tablet; Take 1 tablet by mouth 2 (two) times daily.     Continue all other maintenance medications.  Follow up plan: Return if symptoms worsen or fail to improve.  Continue healthy lifestyle choices, including diet (rich in fruits, vegetables, and lean proteins, and low in salt and simple carbohydrates) and exercise (at least 30 minutes of moderate physical activity daily).  Educational handout given for otitis media  The above assessment and management plan was discussed with the patient. The patient verbalized understanding of and has agreed to the management plan. Patient is aware to call the clinic if they develop any new symptoms or if symptoms persist or worsen. Patient is aware when to return to the clinic for a follow-up visit. Patient educated on when it is appropriate to go to the emergency department.   Monia Pouch, FNP-C [facility] [phone]

## 2019-03-28 NOTE — Patient Instructions (Signed)
Otitis Media, Adult  Otitis media means that the middle ear is red and swollen (inflamed) and full of fluid. The condition usually goes away on its own. Follow these instructions at home:  Take over-the-counter and prescription medicines only as told by your doctor.  If you were prescribed an antibiotic medicine, take it as told by your doctor. Do not stop taking the antibiotic even if you start to feel better.  Keep all follow-up visits as told by your doctor. This is important. Contact a doctor if:  You have bleeding from your nose.  There is a lump on your neck.  You are not getting better in 5 days.  You feel worse instead of better. Get help right away if:  You have pain that is not helped with medicine.  You have swelling, redness, or pain around your ear.  You get a stiff neck.  You cannot move part of your face (paralyzed).  You notice that the bone behind your ear hurts when you touch it.  You get a very bad headache. Summary  Otitis media means that the middle ear is red, swollen, and full of fluid.  This condition usually goes away on its own. In some cases, treatment may be needed.  If you were prescribed an antibiotic medicine, take it as told by your doctor. This information is not intended to replace advice given to you by your health care provider. Make sure you discuss any questions you have with your health care provider. Document Revised: 02/04/2017 Document Reviewed: 03/15/2016 Elsevier Patient Education  2020 Elsevier Inc.  

## 2019-04-16 DIAGNOSIS — R8271 Bacteriuria: Secondary | ICD-10-CM | POA: Diagnosis not present

## 2019-04-16 DIAGNOSIS — N3020 Other chronic cystitis without hematuria: Secondary | ICD-10-CM | POA: Diagnosis not present

## 2019-04-16 DIAGNOSIS — N35021 Urethral stricture due to childbirth: Secondary | ICD-10-CM | POA: Diagnosis not present

## 2019-05-02 DIAGNOSIS — N3020 Other chronic cystitis without hematuria: Secondary | ICD-10-CM | POA: Diagnosis not present

## 2019-05-08 ENCOUNTER — Other Ambulatory Visit: Payer: Self-pay | Admitting: Family Medicine

## 2019-05-08 DIAGNOSIS — G8929 Other chronic pain: Secondary | ICD-10-CM

## 2019-05-16 ENCOUNTER — Ambulatory Visit (INDEPENDENT_AMBULATORY_CARE_PROVIDER_SITE_OTHER): Payer: BLUE CROSS/BLUE SHIELD | Admitting: Family Medicine

## 2019-05-16 ENCOUNTER — Other Ambulatory Visit: Payer: Self-pay

## 2019-05-16 ENCOUNTER — Encounter: Payer: Self-pay | Admitting: Family Medicine

## 2019-05-16 VITALS — BP 130/76 | HR 90 | Temp 97.9°F | Resp 18 | Ht 60.0 in | Wt 145.2 lb

## 2019-05-16 DIAGNOSIS — M546 Pain in thoracic spine: Secondary | ICD-10-CM | POA: Diagnosis not present

## 2019-05-16 DIAGNOSIS — G8929 Other chronic pain: Secondary | ICD-10-CM | POA: Diagnosis not present

## 2019-05-16 MED ORDER — NAPROXEN 500 MG PO TABS: 500.0000 mg | ORAL_TABLET | Freq: Two times a day (BID) | ORAL | 1 refills | Status: AC

## 2019-05-16 NOTE — Patient Instructions (Signed)

## 2019-05-16 NOTE — Progress Notes (Signed)
Established Patient Office Visit  Subjective:  Patient ID: Katrina Lin, female    DOB: 1996/03/05  Age: 24 y.o. MRN: [id number]  CC:  Chief Complaint  Patient presents with  . Back Pain    HPI [name] presents for follow up of chronic back pain. Pt has been taking Naproxen and attended PT for several weeks and did very well with this. Pt states now she is having pain in her mid and lower back. States she feels spasms at times and this is worse after being active all day. No red flags. States aching to sharp in nature. 6/10 at worst.   Past Medical History:  Diagnosis Date  . Anxiety   . Panic attacks     History reviewed. No pertinent surgical history.  Family History  Problem Relation Age of Onset  . Hypertension Mother   . Hyperlipidemia Mother   . Diabetes Maternal Uncle   . Hypertension Paternal Grandfather   . Hyperlipidemia Paternal Grandfather     Social History   Socioeconomic History  . Marital status: Single    Spouse name: Not on file  . Number of children: Not on file  . Years of education: Not on file  . Highest education level: Not on file  Occupational History  . Not on file  Tobacco Use  . Smoking status: Never Smoker  . Smokeless tobacco: Never Used  Substance and Sexual Activity  . Alcohol use: Not Currently  . Drug use: Never  . Sexual activity: Not on file  Other Topics Concern  . Not on file  Social History Narrative  . Not on file   Social Determinants of Health   Financial Resource Strain:   . Difficulty of Paying Living Expenses: Not on file  Food Insecurity:   . Worried About Charity fundraiser in the Last Year: Not on file  . Ran Out of Food in the Last Year: Not on file  Transportation Needs:   . Lack of Transportation (Medical): Not on file  . Lack of Transportation (Non-Medical): Not on file  Physical Activity:   . Days of Exercise per Week: Not on file  . Minutes of Exercise per Session: Not on file    Stress:   . Feeling of Stress : Not on file  Social Connections:   . Frequency of Communication with Friends and Family: Not on file  . Frequency of Social Gatherings with Friends and Family: Not on file  . Attends Religious Services: Not on file  . Active Member of Clubs or Organizations: Not on file  . Attends Archivist Meetings: Not on file  . Marital Status: Not on file  Intimate Partner Violence:   . Fear of Current or Ex-Partner: Not on file  . Emotionally Abused: Not on file  . Physically Abused: Not on file  . Sexually Abused: Not on file    Outpatient Medications Prior to Visit  Medication Sig Dispense Refill  . amoxicillin-clavulanate (AUGMENTIN) 875-125 MG tablet Take 1 tablet by mouth 2 (two) times daily. 20 tablet 0  . Norethin Ace-Eth Estrad-FE (MINASTRIN 24 FE) 1-20 MG-MCG(24) CHEW Chew 1 tablet by mouth daily. Dispense brand name please 28 tablet 12  . norethindrone-ethinyl estradiol (LOESTRIN) 1-20 MG-MCG tablet Take 1 tablet by mouth daily.    . naproxen (NAPROSYN) 500 MG tablet Take 1 tablet (500 mg total) by mouth 2 (two) times daily with a meal. 60 tablet 1   No facility-administered medications prior to  visit.    No Known Allergies  ROS Review of Systems  Constitutional: Negative for activity change, appetite change, chills, diaphoresis, fatigue, fever and unexpected weight change.  HENT: Negative.   Eyes: Negative.  Negative for photophobia and visual disturbance.  Respiratory: Negative for cough, chest tightness and shortness of breath.   Cardiovascular: Negative for chest pain, palpitations and leg swelling.  Gastrointestinal: Negative for abdominal pain, blood in stool, constipation, diarrhea, nausea and vomiting.  Endocrine: Negative.   Genitourinary: Negative for decreased urine volume, difficulty urinating, dysuria, frequency and urgency.  Musculoskeletal: Positive for arthralgias, back pain and myalgias. Negative for gait problem, joint  swelling, neck pain and neck stiffness.  Skin: Negative.   Allergic/Immunologic: Negative.   Neurological: Negative for dizziness, tremors, seizures, syncope, facial asymmetry, speech difficulty, weakness, light-headedness, numbness and headaches.  Hematological: Negative.   Psychiatric/Behavioral: Negative for confusion, hallucinations, sleep disturbance and suicidal ideas.  All other systems reviewed and are negative.     Objective:    Physical Exam  Constitutional: She is oriented to person, place, and time. Vital signs are normal. She appears well-developed and well-nourished. No distress.  HENT:  Head: Normocephalic and atraumatic.  Mouth/Throat: No oropharyngeal exudate.  Eyes: Pupils are equal, round, and reactive to light.  Cardiovascular: Normal rate, regular rhythm and normal heart sounds. Exam reveals no gallop and no friction rub.  No murmur heard. Pulmonary/Chest: Effort normal and breath sounds normal.  Abdominal: Soft. Bowel sounds are normal.  Musculoskeletal:     Cervical back: Normal range of motion and neck supple. Normal.     Thoracic back: Spasms and tenderness present. No swelling, edema, deformity, lacerations, pain or bony tenderness. Decreased range of motion. Normal pulses.     Lumbar back: Tenderness present. No edema, deformity, lacerations, pain, spasms or bony tenderness. Decreased range of motion. Normal pulse.     Right hip: Normal.     Left hip: Normal.  Neurological: She is alert and oriented to person, place, and time. She has normal reflexes.  Skin: Skin is warm and dry. She is not diaphoretic.  Psychiatric: She has a normal mood and affect. Her behavior is normal. Judgment and thought content normal.    BP 130/76   Pulse 90   Temp 97.9 F (36.6 C) (Tympanic)   Resp 18   Ht 5' (1.524 m)   Wt 145 lb 3.2 oz (65.9 kg)   LMP 04/05/2019   SpO2 99%   BMI 28.36 kg/m  Wt Readings from Last 3 Encounters:  05/16/19 145 lb 3.2 oz (65.9 kg)    03/28/19 147 lb (66.7 kg)  01/08/19 147 lb (66.7 kg)     Health Maintenance Due  Topic Date Due  . HIV Screening  12/30/2009  . TETANUS/TDAP  12/30/2013  . PAP-Cervical Cytology Screening  12/31/2015  . PAP SMEAR-Modifier  12/31/2015     Assessment & Plan:   Sahory was seen today for back pain.  Diagnoses and all orders for this visit:  Chronic bilateral thoracic back pain Ongoing back pain. No new or worsening symptoms or new injuries. Will place referral to PT and treat with NSAIDs. Pt advised breast size may be causative and if PT does not help, this will need to be addressed.  -     Ambulatory referral to Physical Therapy -     naproxen (NAPROSYN) 500 MG tablet; Take 1 tablet (500 mg total) by mouth 2 (two) times daily with a meal.    Follow-up: Return in  about 6 weeks (around 06/27/2019), or if symptoms worsen or fail to improve, for back pain.    The above assessment and management plan was discussed with the patient. The patient verbalized understanding of and has agreed to the management plan. Patient is aware to call the clinic if they develop any new symptoms or if symptoms fail to improve or worsen. Patient is aware when to return to the clinic for a follow-up visit. Patient educated on when it is appropriate to go to the emergency department.   Monia Pouch, FNP-C [facility] [address] [place], [place] [address] [phone]

## 2019-05-17 ENCOUNTER — Ambulatory Visit: Payer: BLUE CROSS/BLUE SHIELD | Attending: Internal Medicine

## 2019-05-17 DIAGNOSIS — Z20822 Contact with and (suspected) exposure to covid-19: Secondary | ICD-10-CM

## 2019-05-18 LAB — NOVEL CORONAVIRUS, NAA: SARS-CoV-2, NAA: NOT DETECTED

## 2019-05-27 ENCOUNTER — Ambulatory Visit
Admission: EM | Admit: 2019-05-27 | Discharge: 2019-05-27 | Disposition: A | Discharge status: Home / Self Care | Payer: BLUE CROSS/BLUE SHIELD | Attending: Emergency Medicine | Admitting: Emergency Medicine

## 2019-05-27 ENCOUNTER — Other Ambulatory Visit: Payer: Self-pay

## 2019-05-27 ENCOUNTER — Telehealth: Payer: BLUE CROSS/BLUE SHIELD | Admitting: Physician Assistant

## 2019-05-27 DIAGNOSIS — Z87440 Personal history of urinary (tract) infections: Secondary | ICD-10-CM | POA: Insufficient documentation

## 2019-05-27 DIAGNOSIS — M545 Low back pain: Secondary | ICD-10-CM | POA: Insufficient documentation

## 2019-05-27 DIAGNOSIS — Z791 Long term (current) use of non-steroidal anti-inflammatories (NSAID): Secondary | ICD-10-CM | POA: Diagnosis not present

## 2019-05-27 DIAGNOSIS — R350 Frequency of micturition: Secondary | ICD-10-CM | POA: Diagnosis not present

## 2019-05-27 DIAGNOSIS — N898 Other specified noninflammatory disorders of vagina: Secondary | ICD-10-CM | POA: Diagnosis not present

## 2019-05-27 DIAGNOSIS — N390 Urinary tract infection, site not specified: Secondary | ICD-10-CM

## 2019-05-27 DIAGNOSIS — Z793 Long term (current) use of hormonal contraceptives: Secondary | ICD-10-CM | POA: Diagnosis not present

## 2019-05-27 LAB — POCT URINALYSIS DIP (MANUAL ENTRY)
Bilirubin, UA: NEGATIVE
Glucose, UA: NEGATIVE mg/dL
Ketones, POC UA: NEGATIVE mg/dL
Leukocytes, UA: NEGATIVE
Nitrite, UA: NEGATIVE
Protein Ur, POC: NEGATIVE mg/dL
Spec Grav, UA: 1.015 (ref 1.010–1.025)
Urobilinogen, UA: 0.2 E.U./dL
pH, UA: 7.5 (ref 5.0–8.0)

## 2019-05-27 LAB — POCT URINE PREGNANCY: Preg Test, Ur: NEGATIVE

## 2019-05-27 MED ORDER — NITROFURANTOIN MONOHYD MACRO 100 MG PO CAPS: 100.0000 mg | ORAL_CAPSULE | Freq: Two times a day (BID) | ORAL | 0 refills | Status: DC

## 2019-05-27 MED ORDER — METRONIDAZOLE 0.75 % VA GEL: 1.0000 | Freq: Every day | VAGINAL | 0 refills | Status: AC

## 2019-05-27 NOTE — ED Triage Notes (Signed)
Pt presents with lower back pain and urinary frequency ans some vaginal discharge  Since Thursday

## 2019-05-27 NOTE — Discharge Instructions (Signed)
Will treat for UTI Vaginal swab obtained Urine culture and vaginal swab sent.  We will call you with abnormal results.   Push fluids and get plenty of rest.   Macrobid prescribed.   Metronidazole gel prescribed.  Use as directed Follow up with PCP as needed Return here or go to ER if you have any new or worsening symptoms such as fever, worsening abdominal pain, nausea/vomiting, flank pain, etc..Marland Kitchen

## 2019-05-27 NOTE — Progress Notes (Signed)
Based on what you shared with me, I feel your condition warrants further evaluation and I recommend that you be seen for a face to face office visit.  I you have symptoms of both vaginosis and urinary tract infection, also you are having back pain which is concerning in the setting of urinary tract infections. I would recommend you go to urgent care or the emergency room today to have an in person evaluation and testing done to help get you better and prevent things from worsening.    NOTE: If you entered your credit card information for this eVisit, you will not be charged. You may see a "hold" on your card for the $35 but that hold will drop off and you will not have a charge processed.   If you are having a true medical emergency please call 911.      For an urgent face to face visit, [facility] has five urgent care centers for your convenience:      NEW:  [facility] [place] [facility] [place] Get Driving Directions [id number] [place], [place] [address] . 10 am - 6pm Monday - Friday    [facility]) Get Driving Directions [id number] [address] [place], [place] [address] . 10 am to 8 pm Monday-Friday . 12 pm to 8 pm [facility] Get Driving Directions [id number] [place], [place], [place] [address] . 8 am to 8 pm Monday-Friday . 9 am to 6 pm Saturday . 11 am to 6 pm Sunday     [facility] Get Driving Directions  [id number] [address] [place], [place] [address] . 8 am to 8 pm Monday-Friday . 8 am to 4 pm [facility] at [place] Get Driving Directions [id number] [place] [place], [place] [address] . 12 pm to 6 pm Monday-Friday      Your e-visit answers were reviewed by a board certified advanced clinical practitioner to  complete your personal care plan.  Thank you for using e-Visits.  Greater than 5 minutes, yet less than 10 minutes of time have been spent researching, coordinating, and implementing care for this patient today

## 2019-05-27 NOTE — ED Provider Notes (Signed)
[facility]   CC: UTI  SUBJECTIVE:  Katrina Lin is a 24 y.o. female who complains of lower back discomfort and urinary frequency x 3 days. Reports delayed bathroom breaks.  Describes back pain as intermittent and achy.  Has tried OTC medications with minimal relief.  Symptoms are made worse with urination.  Admits to similar symptoms in the past with UTI.    Also mentions some clear/ white vaginal discharge.  States she was recently tested for STDs that was negative, and has not been sexually active since then.  However, does report hx of BV and states her current symptoms are similar.    Denies fever, chills, nausea, vomiting, abdominal pain, flank pain, hematuria.    LMP: Patient's last menstrual period was 03/29/2019.  ROS: As in HPI.  All other pertinent ROS negative.     Past Medical History:  Diagnosis Date  . Anxiety   . Panic attacks    History reviewed. No pertinent surgical history. No Known Allergies No current facility-administered medications on file prior to encounter.   Current Outpatient Medications on File Prior to Encounter  Medication Sig Dispense Refill  . naproxen (NAPROSYN) 500 MG tablet Take 1 tablet (500 mg total) by mouth 2 (two) times daily with a meal. 180 tablet 1  . Norethin Ace-Eth Estrad-FE (MINASTRIN 24 FE) 1-20 MG-MCG(24) CHEW Chew 1 tablet by mouth daily. Dispense brand name please 28 tablet 12  . norethindrone-ethinyl estradiol (LOESTRIN) 1-20 MG-MCG tablet Take 1 tablet by mouth daily.     Social History   Socioeconomic History  . Marital status: Single    Spouse name: Not on file  . Number of children: Not on file  . Years of education: Not on file  . Highest education level: Not on file  Occupational History  . Not on file  Tobacco Use  . Smoking status: Never Smoker  . Smokeless tobacco: Never Used  Substance and Sexual Activity  . Alcohol use: Not Currently  . Drug use: Never  . Sexual activity: Not on file    Other Topics Concern  . Not on file  Social History Narrative  . Not on file   Social Determinants of Health   Financial Resource Strain:   . Difficulty of Paying Living Expenses:   Food Insecurity:   . Worried About Charity fundraiser in the Last Year:   . Arboriculturist in the Last Year:   Transportation Needs:   . Film/video editor (Medical):   Marland Kitchen Lack of Transportation (Non-Medical):   Physical Activity:   . Days of Exercise per Week:   . Minutes of Exercise per Session:   Stress:   . Feeling of Stress :   Social Connections:   . Frequency of Communication with Friends and Family:   . Frequency of Social Gatherings with Friends and Family:   . Attends Religious Services:   . Active Member of Clubs or Organizations:   . Attends Archivist Meetings:   Marland Kitchen Marital Status:   Intimate Partner Violence:   . Fear of Current or Ex-Partner:   . Emotionally Abused:   Marland Kitchen Physically Abused:   . Sexually Abused:    Family History  Problem Relation Age of Onset  . Hypertension Mother   . Hyperlipidemia Mother   . Diabetes Maternal Uncle   . Hypertension Paternal Grandfather   . Hyperlipidemia Paternal Grandfather     OBJECTIVE:  Vitals:   05/27/19 1450  BP:  139/88  Resp: 18  Temp: 98.2 F (36.8 C)  SpO2: 98%   General appearance: Alert in NAD HEENT: NCAT.  PERRL, EOMI grossly; oropharynx clear.  Lungs: clear to auscultation bilaterally without adventitious breath sounds Heart: regular rate and rhythm.   Abdomen: soft; non-distended; no tenderness; no guarding Back: no CVA tenderness Extremities: no edema; symmetrical with no gross deformities Skin: warm and dry Neurologic: Ambulates from chair to exam table without difficulty Psychological: alert and cooperative; normal mood and affect  Labs Reviewed  POCT URINALYSIS DIP (MANUAL ENTRY) - Abnormal; Notable for the following components:      Result Value   Color, UA light yellow (*)    Blood, UA  trace-intact (*)    All other components within normal limits  URINE CULTURE  POCT URINE PREGNANCY  CERVICOVAGINAL ANCILLARY ONLY    ASSESSMENT & PLAN:  1. Urinary frequency   2. Vaginal discharge     Meds ordered this encounter  Medications  . nitrofurantoin, macrocrystal-monohydrate, (MACROBID) 100 MG capsule    Sig: Take 1 capsule (100 mg total) by mouth 2 (two) times daily.    Dispense:  10 capsule    Refill:  0    Order Specific Question:   Supervising Provider    Answer:   Raylene Everts [id number]  . metroNIDAZOLE (METROGEL) 0.75 % vaginal gel    Sig: Place 1 Applicatorful vaginally at bedtime for 5 days.    Dispense:  70 g    Refill:  0    Order Specific Question:   Supervising Provider    Answer:   Raylene Everts [id number]   Will treat for UTI Vaginal swab obtained Urine culture and vaginal swab sent.  We will call you with abnormal results.   Push fluids and get plenty of rest.   Macrobid prescribed.   Metronidazole gel prescribed.  Use as directed Follow up with PCP as needed Return here or go to ER if you have any new or worsening symptoms such as fever, worsening abdominal pain, nausea/vomiting, flank pain, etc...  Outlined signs and symptoms indicating need for more acute intervention. Patient verbalized understanding. After Visit Summary given.     Lestine Box, PA-C 05/27/19 1516

## 2019-05-29 DIAGNOSIS — N3020 Other chronic cystitis without hematuria: Secondary | ICD-10-CM | POA: Diagnosis not present

## 2019-05-29 LAB — URINE CULTURE: Culture: <10000 — AB

## 2019-05-30 LAB — CERVICOVAGINAL ANCILLARY ONLY
Bacterial vaginitis: NEGATIVE
Candida vaginitis: NEGATIVE
Chlamydia: NEGATIVE
Neisseria Gonorrhea: NEGATIVE
Trichomonas: NEGATIVE

## 2019-06-07 ENCOUNTER — Encounter: Payer: Self-pay | Admitting: Physical Therapy

## 2019-06-07 ENCOUNTER — Other Ambulatory Visit: Payer: Self-pay

## 2019-06-07 ENCOUNTER — Ambulatory Visit: Payer: BLUE CROSS/BLUE SHIELD | Attending: Family Medicine | Admitting: Physical Therapy

## 2019-06-07 DIAGNOSIS — M546 Pain in thoracic spine: Secondary | ICD-10-CM | POA: Diagnosis not present

## 2019-06-07 DIAGNOSIS — M6281 Muscle weakness (generalized): Secondary | ICD-10-CM | POA: Insufficient documentation

## 2019-06-07 DIAGNOSIS — G8929 Other chronic pain: Secondary | ICD-10-CM | POA: Diagnosis not present

## 2019-06-07 DIAGNOSIS — M545 Low back pain: Secondary | ICD-10-CM | POA: Diagnosis not present

## 2019-06-07 NOTE — Therapy (Signed)
[facility] [place], [place], [address] Phone: [phone]   Fax:  [phone]  Physical Therapy Evaluation  Patient Details  Name: Katrina Lin MRN: [id number] Date of Birth: [birth year] Referring Provider (PT): Darla Lesches, FNP   Encounter Date: 06/07/2019  PT End of Session - 06/07/19 1627    Visit Number  1    Number of Visits  6    Date for PT Re-Evaluation  07/26/19    PT Start Time  1430    PT Stop Time  1508    PT Time Calculation (min)  38 min    Activity Tolerance  Patient tolerated treatment well    Behavior During Therapy  [facility] for tasks assessed/performed       Past Medical History:  Diagnosis Date  . Anxiety   . Panic attacks     History reviewed. No pertinent surgical history.  There were no vitals filed for this visit.   Subjective Assessment - 06/07/19 1635    Subjective  Patient arrives to physical therapy with reports of thoracic pain and poor posture that began about 3 months ago. Patient reports pain with lifting, child care activities, when sitting with poor posture. Patient reports pain at worst as 10/10 and pain at best as 3/10. Patient has been compliant with HEP provided from previous PT episodes of care but stopped her gym membership and would like to return in the summer time. Patient's goals are to decrease pain, improve strength, improve ability to perform home and care giving tasks and improve posture.    Limitations  House hold activities    Diagnostic tests  x-ray, MRI: normal results    Patient Stated Goals  improve posture    Currently in Pain?  Yes    Pain Score  8     Pain Location  Back    Pain Orientation  Mid;Upper    Pain Descriptors / Indicators  Contraction    Pain Type  Acute pain    Pain Onset  More than a month ago    Pain Frequency  Constant    Aggravating Factors   lifting, sitting with bad posture    Pain Relieving Factors  sitting with good posture, heat,  TENs unit    Effect of Pain on Daily Activities  difficulties with school work and child care activities.         [facility] PT Assessment - 06/07/19 0001      ROM / Strength   AROM / PROM / Strength  AROM;Strength      AROM   Overall AROM   Within functional limits for tasks performed    AROM Assessment Site  Shoulder      Strength   Overall Strength Comments  Bilateral lower traps 3/5    Strength Assessment Site  Shoulder    Right/Left Shoulder  Right;Left    Right Shoulder Flexion  3+/5    Right Shoulder Extension  3+/5    Right Shoulder ABduction  3+/5    Right Shoulder Internal Rotation  3+/5    Right Shoulder External Rotation  3+/5    Left Shoulder Flexion  3+/5    Left Shoulder Extension  3+/5    Left Shoulder ABduction  3+/5    Left Shoulder Internal Rotation  3+/5    Left Shoulder External Rotation  3+/5      Palpation   Spinal mobility  Decreased PA mobs in prone    Palpation comment  bilateral tenderness to thoracolumbar parapsinals increased tone in rhomboids                Objective measurements completed on examination: See above findings.              PT Education - 06/07/19 1513    Education Details  thoracic mob with towel, thoracic stretch, open book stretch, prone Y, prone row in 45 abd.    Person(s) Educated  Patient    Methods  Explanation;Demonstration;Handout    Comprehension  Verbalized understanding;Returned demonstration          PT Long Term Goals - 06/07/19 1632      PT LONG TERM GOAL #1   Title  Patient will be independent with HEP and its progression    Time  6    Period  Weeks    Status  New      PT LONG TERM GOAL #2   Title  Patient will demonstrate 4+/5 or greater UE strength bilaterally to improve stability during functional tasks and improve posture.    Time  6    Period  Weeks    Status  New      PT LONG TERM GOAL #3   Title  Patient will report ability to perform care giving activities and home  activities with thoracic back pain less than or equal to 3/10.    Time  6    Period  Weeks    Status  New      PT LONG TERM GOAL #4   Title  Patient will report ability to sit for school activities with an upright posture and thoracic pain less than or equal to 3/10    Time  6    Period  Weeks    Status  New             Plan - 06/07/19 1628    Clinical Impression Statement  Patient is a 24 year old female who presents to physical therapy with mid to lower thoracic pain, decreased bilateral shoulder MMT, and decreased PA throacic joint mobs that began about February 2021. Patient noted with increased trigger points in thoracolumbar paraspinals with notable trigger point in bilateral rhomboids. Patient noted with forward head, rounded shoulders and increased thoracic kyphosis in sitting. Patient and PT discussed plan of care and  HEP to which patient reported understanding. Patient would benefit from skilled physical therapy to address deficits and address patient's goals.    Personal Factors and Comorbidities  Age    Stability/Clinical Decision Making  Stable/Uncomplicated    Clinical Decision Making  Low    Rehab Potential  Good    PT Frequency  1x / week    PT Duration  6 weeks    PT Treatment/Interventions  Cryotherapy;Electrical Stimulation;ADLs/Self Care Home Management;Moist Heat;Neuromuscular re-education;Manual techniques;Therapeutic exercise;Therapeutic activities;Patient/family education;Passive range of motion;Balance training    PT Next Visit Plan  nustep, UBE, postural strengthening, thoracic mobs and chest stretch with foam roller; modalities only if needed.    PT Home Exercise Plan  see pt education section    Consulted and Agree with Plan of Care  Patient       Patient will benefit from skilled therapeutic intervention in order to improve the following deficits and impairments:  Decreased activity tolerance, Decreased strength, Pain, Increased muscle spasms, Postural  dysfunction  Visit Diagnosis: Pain in thoracic spine - Plan: PT plan of care cert/re-cert  Muscle weakness (generalized) - Plan: PT plan of care  cert/re-cert     Problem List Patient Active Problem List   Diagnosis Date Noted  . Chronic midline low back pain with left-sided sciatica 01/12/2018  . BMI 29.0-29.9,adult 01/12/2018    Gabriela Eves, PT, DPT 06/07/2019, 4:43 PM  [facility] [address] [place], [place], [address] Phone: [phone]   Fax:  [phone]  Name: Katrina Lin MRN: [id number] Date of Birth: Jul 07, 1995

## 2019-06-13 ENCOUNTER — Other Ambulatory Visit: Payer: Self-pay

## 2019-06-13 ENCOUNTER — Encounter: Payer: Self-pay | Admitting: Physical Therapy

## 2019-06-13 ENCOUNTER — Ambulatory Visit: Payer: BLUE CROSS/BLUE SHIELD | Admitting: Physical Therapy

## 2019-06-13 DIAGNOSIS — M6281 Muscle weakness (generalized): Secondary | ICD-10-CM | POA: Diagnosis not present

## 2019-06-13 DIAGNOSIS — M546 Pain in thoracic spine: Secondary | ICD-10-CM

## 2019-06-13 DIAGNOSIS — M545 Low back pain, unspecified: Secondary | ICD-10-CM

## 2019-06-13 DIAGNOSIS — G8929 Other chronic pain: Secondary | ICD-10-CM | POA: Diagnosis not present

## 2019-06-13 NOTE — Therapy (Addendum)
[facility] [place], [place], [address] Phone: [phone]   Fax:  [phone]  Physical Therapy Treatment PHYSICAL THERAPY DISCHARGE SUMMARY  Visits from Start of Care: 2  Current functional level related to goals / functional outcomes: See below   Remaining deficits: See goals   Education / Equipment: HEP Plan: Patient agrees to discharge.  Patient goals were not met. Patient is being discharged due to not returning since the last visit.  ?????   Gabriela Eves, PT, DPT 12/21/19  Patient Details  Name: Katrina Lin MRN: [id number] Date of Birth: 1995-09-20 Referring Provider (PT): Darla Lesches, FNP   Encounter Date: 06/13/2019  PT End of Session - 06/13/19 1439    Visit Number  2    Number of Visits  6    Date for PT Re-Evaluation  07/26/19    PT Start Time  1430    PT Stop Time  1515    PT Time Calculation (min)  45 min    Activity Tolerance  Patient tolerated treatment well    Behavior During Therapy  [facility] for tasks assessed/performed       Past Medical History:  Diagnosis Date  . Anxiety   . Panic attacks     History reviewed. No pertinent surgical history.  There were no vitals filed for this visit.  Subjective Assessment - 06/13/19 1439    Subjective  COVID-19 screening performed upon arrival. Patient arrives with 6/10 soreness due to doing a lot of driving.    Limitations  House hold activities    Diagnostic tests  x-ray, MRI: normal results    Patient Stated Goals  improve posture    Currently in Pain?  Yes    Pain Location  Back    Pain Orientation  Mid;Upper    Pain Descriptors / Indicators  Sore    Pain Type  Acute pain    Pain Onset  More than a month ago    Pain Frequency  Constant                       [facility] Adult PT Treatment/Exercise - 06/13/19 0001      Exercises   Exercises  Lumbar      Lumbar Exercises: Stretches   Other Lumbar Stretch Exercise   chest stretch with vertical bolster 3x34mn      Lumbar Exercises: Aerobic   UBE (Upper Arm Bike)  90 RPM x8 mins    Nustep  Level 5 x10 mins      Lumbar Exercises: Machines for Strengthening   Cybex Lumbar Extension  60# x3 mins followed by 60# abs x3 mins      Lumbar Exercises: Supine   Other Supine Lumbar Exercises  upper thoracic extension over foam roll 3x10      Lumbar Exercises: Quadruped   Plank  forearm plank x3 to fatigue 8-13 seconds                  PT Long Term Goals - 06/07/19 1632      PT LONG TERM GOAL #1   Title  Patient will be independent with HEP and its progression    Time  6    Period  Weeks    Status  New      PT LONG TERM GOAL #2   Title  Patient will demonstrate 4+/5 or greater UE strength bilaterally to improve stability during functional tasks and improve posture.  Time  6    Period  Weeks    Status  New      PT LONG TERM GOAL #3   Title  Patient will report ability to perform care giving activities and home activities with thoracic back pain less than or equal to 3/10.    Time  6    Period  Weeks    Status  New      PT LONG TERM GOAL #4   Title  Patient will report ability to sit for school activities with an upright posture and thoracic pain less than or equal to 3/10    Time  6    Period  Weeks    Status  New            Plan - 06/13/19 1440    Clinical Impression Statement  Patient responded well to therapy session with minimal reports of increased mid thoracic back pain. Patient guided through TEs for core stability and postural muscles. Patient noted with improved form with tactile cuing and demonstration. Patient instructed use heat PRN at home for pain relief.    Personal Factors and Comorbidities  Age    Stability/Clinical Decision Making  Stable/Uncomplicated    Clinical Decision Making  Low    Rehab Potential  Good    PT Frequency  1x / week    PT Duration  6 weeks    PT Treatment/Interventions   Cryotherapy;Electrical Stimulation;ADLs/Self Care Home Management;Moist Heat;Neuromuscular re-education;Manual techniques;Therapeutic exercise;Therapeutic activities;Patient/family education;Passive range of motion;Balance training    PT Next Visit Plan  nustep, UBE, postural strengthening, thoracic mobs and chest stretch with foam roller; modalities only if needed.    PT Home Exercise Plan  see pt education section    Consulted and Agree with Plan of Care  Patient       Patient will benefit from skilled therapeutic intervention in order to improve the following deficits and impairments:  Decreased activity tolerance, Decreased strength, Pain, Increased muscle spasms, Postural dysfunction  Visit Diagnosis: Pain in thoracic spine  Muscle weakness (generalized)  Chronic midline low back pain, unspecified whether sciatica present     Problem List Patient Active Problem List   Diagnosis Date Noted  . Chronic midline low back pain with left-sided sciatica 01/12/2018  . BMI 29.0-29.9,adult 01/12/2018    Gabriela Eves, PT, DPT 06/13/2019, 3:19 PM  [facility] [place], [place], [address] Phone: [phone]   Fax:  [phone]  Name: Hawley Pavia MRN: [id number] Date of Birth: 1995-11-01

## 2019-06-20 ENCOUNTER — Ambulatory Visit: Payer: BLUE CROSS/BLUE SHIELD | Admitting: Physical Therapy

## 2019-07-23 ENCOUNTER — Ambulatory Visit
Admission: EM | Admit: 2019-07-23 | Discharge: 2019-07-23 | Disposition: A | Discharge status: Home / Self Care | Payer: BLUE CROSS/BLUE SHIELD | Attending: Emergency Medicine | Admitting: Emergency Medicine

## 2019-07-23 ENCOUNTER — Other Ambulatory Visit: Payer: Self-pay

## 2019-07-23 DIAGNOSIS — Z793 Long term (current) use of hormonal contraceptives: Secondary | ICD-10-CM | POA: Insufficient documentation

## 2019-07-23 DIAGNOSIS — J029 Acute pharyngitis, unspecified: Secondary | ICD-10-CM | POA: Insufficient documentation

## 2019-07-23 DIAGNOSIS — Z113 Encounter for screening for infections with a predominantly sexual mode of transmission: Secondary | ICD-10-CM | POA: Insufficient documentation

## 2019-07-23 DIAGNOSIS — Z791 Long term (current) use of non-steroidal anti-inflammatories (NSAID): Secondary | ICD-10-CM | POA: Diagnosis not present

## 2019-07-23 LAB — POCT RAPID STREP A (OFFICE): Rapid Strep A Screen: NEGATIVE

## 2019-07-23 NOTE — ED Triage Notes (Signed)
Pt here for sore throat x 2 days, patient reports white patches on tonsil. Requesting to be STD checked orally and vaginally.

## 2019-07-23 NOTE — ED Provider Notes (Signed)
[place]   [id number] 07/23/19 Arrival Time: [id number]   CC: Sore throat/concern of STD  SUBJECTIVE:  Katrina Lin is a 24 y.o. female who presents with complaints of gradual sore throat for the past 2 days.  She denies a precipitating event, recent sexual encounter or recent antibiotic use.  Patient is sexually active with 1 female partner.  Denies vaginal discharge.  Denies any worsening symptoms.  Denies similar symptoms in the past.  She denies fever, chills, nausea, vomiting, abdominal or pelvic pain, urinary symptoms, vaginal itching, vaginal odor, vaginal bleeding, dyspareunia, vaginal rashes or lesions.   Patient's last menstrual period was 06/15/2019. Current birth control method: Compliant with BC:  ROS: As per HPI.  All other pertinent ROS negative.     Past Medical History:  Diagnosis Date  . Anxiety   . Panic attacks    No past surgical history on file. No Known Allergies No current facility-administered medications on file prior to encounter.   Current Outpatient Medications on File Prior to Encounter  Medication Sig Dispense Refill  . naproxen (NAPROSYN) 500 MG tablet Take 1 tablet (500 mg total) by mouth 2 (two) times daily with a meal. 180 tablet 1  . nitrofurantoin, macrocrystal-monohydrate, (MACROBID) 100 MG capsule Take 1 capsule (100 mg total) by mouth 2 (two) times daily. 10 capsule 0  . Norethin Ace-Eth Estrad-FE (MINASTRIN 24 FE) 1-20 MG-MCG(24) CHEW Chew 1 tablet by mouth daily. Dispense brand name please 28 tablet 12  . norethindrone-ethinyl estradiol (LOESTRIN) 1-20 MG-MCG tablet Take 1 tablet by mouth daily.      Social History   Socioeconomic History  . Marital status: Single    Spouse name: Not on file  . Number of children: Not on file  . Years of education: Not on file  . Highest education level: Not on file  Occupational History  . Not on file  Tobacco Use  . Smoking status: Never Smoker  . Smokeless tobacco: Never Used    Substance and Sexual Activity  . Alcohol use: Not Currently  . Drug use: Never  . Sexual activity: Not on file  Other Topics Concern  . Not on file  Social History Narrative  . Not on file   Social Determinants of Health   Financial Resource Strain:   . Difficulty of Paying Living Expenses:   Food Insecurity:   . Worried About Charity fundraiser in the Last Year:   . Arboriculturist in the Last Year:   Transportation Needs:   . Film/video editor (Medical):   Marland Kitchen Lack of Transportation (Non-Medical):   Physical Activity:   . Days of Exercise per Week:   . Minutes of Exercise per Session:   Stress:   . Feeling of Stress :   Social Connections:   . Frequency of Communication with Friends and Family:   . Frequency of Social Gatherings with Friends and Family:   . Attends Religious Services:   . Active Member of Clubs or Organizations:   . Attends Archivist Meetings:   Marland Kitchen Marital Status:   Intimate Partner Violence:   . Fear of Current or Ex-Partner:   . Emotionally Abused:   Marland Kitchen Physically Abused:   . Sexually Abused:    Family History  Problem Relation Age of Onset  . Hypertension Mother   . Hyperlipidemia Mother   . Diabetes Maternal Uncle   . Hypertension Paternal Grandfather   . Hyperlipidemia Paternal Grandfather  OBJECTIVE:  Vitals:   07/23/19 1722  BP: 137/84  Pulse: 85  Resp: 16  Temp: 98.7 F (37.1 C)  TempSrc: Oral  SpO2: 98%     General appearance: Alert, NAD, appears stated age Head: NCAT Throat: lips, mucosa, and tongue normal; teeth and gums normal, tonsils 1+ with uvula in midline Lungs: CTA bilaterally without adventitious breath sounds Heart: regular rate and rhythm.  Radial pulses 2+ symmetrical bilaterally Back: no CVA tenderness Abdomen: soft, non-tender; bowel sounds normal; no masses or organomegaly; no guarding or rebound tenderness GU: Vaginal self swab obtained  Skin: warm and dry Psychological:  Alert and  cooperative. Normal mood and affect.  LABS:  Results for orders placed or performed during the hospital encounter of 05/27/19  Urine Culture   Specimen: Urine, Clean Catch  Result Value Ref Range   Specimen Description      URINE, CLEAN CATCH Performed at [facility], [address], [place], [place] [address]    Special Requests      NONE Performed at [facility], [address], [place], [place] [address]    Culture (A)     <10,000 COLONIES/mL INSIGNIFICANT GROWTH Performed at [facility] [address], [place], [place] [address]    Report Status 05/29/2019 FINAL   POCT urinalysis dipstick  Result Value Ref Range   Color, UA light yellow (A) yellow   Clarity, UA clear clear   Glucose, UA negative negative mg/dL   Bilirubin, UA negative negative   Ketones, POC UA negative negative mg/dL   Spec Grav, UA 1.015 1.010 - 1.025   Blood, UA trace-intact (A) negative   pH, UA 7.5 5.0 - 8.0   Protein Ur, POC negative negative mg/dL   Urobilinogen, UA 0.2 0.2 or 1.0 E.U./dL   Nitrite, UA Negative Negative   Leukocytes, UA Negative Negative  POCT urine pregnancy  Result Value Ref Range   Preg Test, Ur Negative Negative  Cervicovaginal ancillary only  Result Value Ref Range   Bacterial vaginitis Negative for Bacterial Vaginitis Microorganisms    Candida vaginitis Negative for Candida species    Trichomonas Negative    Chlamydia Negative    Neisseria Gonorrhea Negative     Labs Reviewed  CULTURE, GROUP A STREP [facility])  POCT RAPID STREP A (OFFICE)  CERVICOVAGINAL ANCILLARY ONLY  CERVICOVAGINAL ANCILLARY ONLY    ASSESSMENT & PLAN:  1. Sore throat   2. Screening examination for STD (sexually transmitted disease)     No orders of the defined types were placed in this encounter.   Pending: Labs Reviewed  CULTURE, GROUP A STREP [facility])  POCT RAPID STREP A (OFFICE)  CERVICOVAGINAL ANCILLARY ONLY  CERVICOVAGINAL ANCILLARY ONLY   Discharge instruction POCT  strep test was negative/sample sent for culture.  Someone will call if result is abnormal Vaginal self-swab obtained.  We will follow up with you regarding abnormal results Take medications as prescribed and to completion If tests results are positive, please abstain from sexual activity until you and your partner(s) have been treated Follow up with PCP or [place] if symptoms persists Return here or go to ER if you have any new or worsening symptoms fever, chills, nausea, vomiting, abdominal or pelvic pain, painful intercourse, vaginal discharge, vaginal bleeding, persistent symptoms despite treatment, etc...  Reviewed expectations re: course of current medical issues. Questions answered. Outlined signs and symptoms indicating need for more acute intervention. Patient verbalized understanding. After Visit Summary given.  Emerson Monte, FNP 07/23/19 1801

## 2019-07-23 NOTE — Discharge Instructions (Signed)
POCT strep test was negative/sample sent for culture.  Someone will call if result is abnormal Vaginal self-swab obtained.  We will follow up with you regarding abnormal results Take medications as prescribed and to completion If tests results are positive, please abstain from sexual activity until you and your partner(s) have been treated Follow up with PCP or [place] if symptoms persists Return here or go to ER if you have any new or worsening symptoms fever, chills, nausea, vomiting, abdominal or pelvic pain, painful intercourse, vaginal discharge, vaginal bleeding, persistent symptoms despite treatment,

## 2019-07-24 LAB — CERVICOVAGINAL ANCILLARY ONLY
Bacterial Vaginitis (gardnerella): NEGATIVE
Bacterial Vaginitis (gardnerella): NEGATIVE
Candida Glabrata: NEGATIVE
Candida Glabrata: NEGATIVE
Candida Vaginitis: NEGATIVE
Candida Vaginitis: NEGATIVE
Chlamydia: NEGATIVE
Chlamydia: NEGATIVE
Comment: NEGATIVE
Comment: NEGATIVE
Comment: NEGATIVE
Comment: NEGATIVE
Comment: NEGATIVE
Comment: NORMAL
Comment: NEGATIVE
Comment: NEGATIVE
Comment: NEGATIVE
Comment: NEGATIVE
Comment: NEGATIVE
Comment: NORMAL
Neisseria Gonorrhea: NEGATIVE
Neisseria Gonorrhea: NEGATIVE
Trichomonas: NEGATIVE
Trichomonas: NEGATIVE

## 2019-07-25 LAB — CULTURE, GROUP A STREP (THRC)

## 2019-10-20 ENCOUNTER — Ambulatory Visit
Admission: EM | Admit: 2019-10-20 | Discharge: 2019-10-20 | Disposition: A | Discharge status: Home / Self Care | Payer: BLUE CROSS/BLUE SHIELD | Attending: Emergency Medicine | Admitting: Emergency Medicine

## 2019-10-20 ENCOUNTER — Other Ambulatory Visit: Payer: Self-pay

## 2019-10-20 ENCOUNTER — Encounter: Payer: Self-pay | Admitting: Emergency Medicine

## 2019-10-20 ENCOUNTER — Ambulatory Visit (INDEPENDENT_AMBULATORY_CARE_PROVIDER_SITE_OTHER): Payer: BLUE CROSS/BLUE SHIELD

## 2019-10-20 DIAGNOSIS — M79671 Pain in right foot: Secondary | ICD-10-CM | POA: Diagnosis not present

## 2019-10-20 DIAGNOSIS — W108XXA Fall (on) (from) other stairs and steps, initial encounter: Secondary | ICD-10-CM | POA: Diagnosis not present

## 2019-10-20 DIAGNOSIS — S99921A Unspecified injury of right foot, initial encounter: Secondary | ICD-10-CM | POA: Diagnosis not present

## 2019-10-20 MED ORDER — IBUPROFEN 800 MG PO TABS: 800.0000 mg | ORAL_TABLET | Freq: Three times a day (TID) | ORAL | 0 refills | Status: DC

## 2019-10-20 NOTE — Discharge Instructions (Addendum)
Follow RICE instruction as attached Take ibuprofen as needed for pain Follow-up with PCP Return or go to ED for worsening of symptoms

## 2019-10-20 NOTE — ED Provider Notes (Signed)
[place]   [id number] 10/20/19 Arrival Time: 1214   Chief Complaint  Patient presents with  . Foot Injury     SUBJECTIVE: History from: patient.  Katrina Lin is a 24 y.o. female who presented to the urgent care with a complaint of fall that occurred yesterday.  Reported right foot pain with difficulty walking. She localizes the pain to the right foot.  She describes the pain as constant and achy.  She has tried OTC medications without relief.  Her symptoms are made worse with ROM.  She denies similar symptoms in the past.  Denies chills, fever, nausea, vomiting, diarrhea.  ROS: As per HPI.  All other pertinent ROS negative.      Past Medical History:  Diagnosis Date  . Anxiety   . Panic attacks    History reviewed. No pertinent surgical history. No Known Allergies No current facility-administered medications on file prior to encounter.   Current Outpatient Medications on File Prior to Encounter  Medication Sig Dispense Refill  . nitrofurantoin, macrocrystal-monohydrate, (MACROBID) 100 MG capsule Take 1 capsule (100 mg total) by mouth 2 (two) times daily. 10 capsule 0  . Norethin Ace-Eth Estrad-FE (MINASTRIN 24 FE) 1-20 MG-MCG(24) CHEW Chew 1 tablet by mouth daily. Dispense brand name please 28 tablet 12  . norethindrone-ethinyl estradiol (LOESTRIN) 1-20 MG-MCG tablet Take 1 tablet by mouth daily.     Social History   Socioeconomic History  . Marital status: Single    Spouse name: Not on file  . Number of children: Not on file  . Years of education: Not on file  . Highest education level: Not on file  Occupational History  . Not on file  Tobacco Use  . Smoking status: Never Smoker  . Smokeless tobacco: Never Used  Vaping Use  . Vaping Use: Never used  Substance and Sexual Activity  . Alcohol use: Not Currently  . Drug use: Never  . Sexual activity: Not on file  Other Topics Concern  . Not on file  Social History Narrative  . Not on file    Social Determinants of Health   Financial Resource Strain:   . Difficulty of Paying Living Expenses:   Food Insecurity:   . Worried About Charity fundraiser in the Last Year:   . Arboriculturist in the Last Year:   Transportation Needs:   . Film/video editor (Medical):   Marland Kitchen Lack of Transportation (Non-Medical):   Physical Activity:   . Days of Exercise per Week:   . Minutes of Exercise per Session:   Stress:   . Feeling of Stress :   Social Connections:   . Frequency of Communication with Friends and Family:   . Frequency of Social Gatherings with Friends and Family:   . Attends Religious Services:   . Active Member of Clubs or Organizations:   . Attends Archivist Meetings:   Marland Kitchen Marital Status:   Intimate Partner Violence:   . Fear of Current or Ex-Partner:   . Emotionally Abused:   Marland Kitchen Physically Abused:   . Sexually Abused:    Family History  Problem Relation Age of Onset  . Hypertension Mother   . Hyperlipidemia Mother   . Diabetes Maternal Uncle   . Hypertension Paternal Grandfather   . Hyperlipidemia Paternal Grandfather     OBJECTIVE:  Vitals:   10/20/19 1301 10/20/19 1304  BP:  129/80  Pulse:  68  Resp:  17  Temp:  98 F (  36.7 C)  TempSrc:  Oral  SpO2:  97%  Weight: 147 lb (66.7 kg)   Height: 5' (1.524 m)      Physical Exam Vitals and nursing note reviewed.  Constitutional:      General: She is not in acute distress.    Appearance: Normal appearance. She is normal weight. She is not ill-appearing, toxic-appearing or diaphoretic.  HENT:     Head: Normocephalic.  Cardiovascular:     Rate and Rhythm: Normal rate and regular rhythm.     Pulses: Normal pulses.     Heart sounds: Normal heart sounds. No murmur heard.  No friction rub. No gallop.   Pulmonary:     Effort: Pulmonary effort is normal. No respiratory distress.     Breath sounds: Normal breath sounds. No stridor. No wheezing, rhonchi or rales.  Chest:     Chest wall: No  tenderness.  Musculoskeletal:     Right foot: Tenderness present.     Left foot: Normal.     Comments: Patient is able to ambulate and bear weight with pain.  The right foot is without any obvious asymmetry or deformity when compared to the left foot.  No surface trauma, ecchymosis, trauma, open wound or lesion present.  Normal dorsiflexion and extension.  Limited range of motion on inversion and eversion due to pain.  Neurovascular status intact.  Neurological:     Mental Status: She is alert and oriented to person, place, and time.     LABS:  No results found for this or any previous visit (from the past 24 hour(s)).    RADIOLOGY  DG Foot Complete Right  Result Date: 10/20/2019 CLINICAL DATA:  Pain after trauma last night. EXAM: RIGHT FOOT COMPLETE - 3+ VIEW COMPARISON:  None. FINDINGS: No acute fracture or dislocation. No significant soft tissue swelling. IMPRESSION: No acute osseous abnormality. Electronically Signed   By: Abigail Miyamoto M.D.   On: 10/20/2019 13:44     ASSESSMENT & PLAN:  1. Right foot pain   2. Fall down steps, initial encounter     Meds ordered this encounter  Medications  . ibuprofen (ADVIL) 800 MG tablet    Sig: Take 1 tablet (800 mg total) by mouth 3 (three) times daily.    Dispense:  21 tablet    Refill:  0   Patient stable at discharge.  Right foot x-ray is negative for bony abnormality including fracture or dislocation.  I have reviewed the x-ray myself and the radiologist interpretation.  I am in agreement with the radiologist interpretation.  Symptom is likely a sprain.  Ibuprofen was prescribed as needed for pain  Discharge Instructions..  Follow RICE instruction as attached Take ibuprofen as needed for pain Follow-up with PCP Return or go to ED for worsening of symptoms   Reviewed expectations re: course of current medical issues. Questions answered. Outlined signs and symptoms indicating need for more acute intervention. Patient  verbalized understanding. After Visit Summary given.         Emerson Monte, FNP 10/20/19 1406

## 2019-10-20 NOTE — ED Triage Notes (Signed)
Tripped and fell down 5 steps yesterday. Pain to RT foot.  Ambulatory with no difficulty

## 2019-11-14 ENCOUNTER — Other Ambulatory Visit: Payer: Self-pay | Admitting: *Deleted

## 2019-11-14 ENCOUNTER — Other Ambulatory Visit: Payer: BLUE CROSS/BLUE SHIELD

## 2019-11-14 ENCOUNTER — Other Ambulatory Visit: Payer: Self-pay

## 2019-11-14 DIAGNOSIS — Z20822 Contact with and (suspected) exposure to covid-19: Secondary | ICD-10-CM

## 2019-11-16 LAB — SARS-COV-2, NAA 2 DAY TAT

## 2019-11-16 LAB — NOVEL CORONAVIRUS, NAA: SARS-CoV-2, NAA: NOT DETECTED

## 2019-11-20 DIAGNOSIS — R8271 Bacteriuria: Secondary | ICD-10-CM | POA: Diagnosis not present

## 2019-11-20 DIAGNOSIS — N342 Other urethritis: Secondary | ICD-10-CM | POA: Diagnosis not present

## 2019-11-22 DIAGNOSIS — N760 Acute vaginitis: Secondary | ICD-10-CM | POA: Diagnosis not present

## 2019-11-22 DIAGNOSIS — B373 Candidiasis of vulva and vagina: Secondary | ICD-10-CM | POA: Diagnosis not present

## 2019-12-12 ENCOUNTER — Ambulatory Visit: Payer: BLUE CROSS/BLUE SHIELD | Admitting: Family Medicine

## 2019-12-12 ENCOUNTER — Encounter: Payer: Self-pay | Admitting: Family Medicine

## 2019-12-12 ENCOUNTER — Other Ambulatory Visit: Payer: Self-pay

## 2019-12-12 VITALS — BP 122/84 | HR 85 | Temp 98.5°F | Ht 60.0 in | Wt 149.2 lb

## 2019-12-12 DIAGNOSIS — R21 Rash and other nonspecific skin eruption: Secondary | ICD-10-CM | POA: Diagnosis not present

## 2019-12-12 DIAGNOSIS — Z91013 Allergy to seafood: Secondary | ICD-10-CM

## 2019-12-12 MED ORDER — TRIAMCINOLONE ACETONIDE 0.1 % EX CREA: 1.0000 "application " | TOPICAL_CREAM | Freq: Two times a day (BID) | CUTANEOUS | 0 refills | Status: DC

## 2019-12-12 NOTE — Progress Notes (Signed)
Subjective: CC: rash PCP: Katrina Perking, FNP  [id number] Katrina Lin is a 24 y.o. female presenting to clinic today for:  1. Rash Katrina Lin reports a rash x5 days. She first noted a few raised, red circular spots on her hands. A day or so later she noted a few on each arm, one on her chest, and one on her back. The areas are itchy. There are some small scabs from where she has been scratching. She thinks the first spots on her hands are a little better and are no longer raised. She has tried Claritin with little relief. She has tried hydrocortisone cream OTC with some relief. She denies fever or drainage from the areas. She denies new products, detergents, or new medications. 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   2. Seafood allergy Katrina Lin reports developing a seafood allergy 2-3 years ago while she was pregnant. She would develop a rash, nausea, and abdominal pain after eating seafood. She has avoided seafood since but she is interested in allergy tested to see if she is still allergic to seafood. She would also like to see if she has other allergies since she is often itchy at work.   Relevant past medical, surgical, family, and social history reviewed and updated as indicated.  Allergies and medications reviewed and updated.  No Known Allergies Past Medical History:  Diagnosis Date  . Anxiety   . Panic attacks     Current Outpatient Medications:  .  Cholecalciferol (VITAMIN D3 PO), Take by mouth., Disp: , Rfl:  .  Cyanocobalamin (VITAMIN B-12 PO), Take by mouth., Disp: , Rfl:  .  ibuprofen (ADVIL) 800 MG tablet, Take 1 tablet (800 mg total) by mouth 3 (three) times daily., Disp: 21 tablet, Rfl: 0 .  nitrofurantoin, macrocrystal-monohydrate, (MACROBID) 100 MG capsule, Take 1 capsule (100 mg total) by mouth 2 (two) times daily., Disp: 10 capsule, Rfl: 0 .  Norethin Ace-Eth Estrad-FE (MINASTRIN 24 FE) 1-20 MG-MCG(24)  CHEW, Chew 1 tablet by mouth daily. Dispense brand name please, Disp: 28 tablet, Rfl: 12 .  triamcinolone cream (KENALOG) 0.1 %, Apply 1 application topically 2 (two) times daily., Disp: 45 g, Rfl: 0 Social History   Socioeconomic History  . Marital status: Single    Spouse name: Not on file  . Number of children: Not on file  . Years of education: Not on file  . Highest education level: Not on file  Occupational History  . Not on file  Tobacco Use  . Smoking status: Never Smoker  . Smokeless tobacco: Never Used  Vaping Use  . Vaping Use: Never used  Substance and Sexual Activity  . Alcohol use: Not Currently  . Drug use: Never  . Sexual activity: Not on file  Other Topics Concern  . Not on file  Social History Narrative  . Not on file   Social Determinants of Health   Financial Resource Strain:   . Difficulty of Paying Living Expenses: Not on file  Food Insecurity:   . Worried About Charity fundraiser in the Last Year: Not on file  . Ran Out of Food in the Last Year: Not on file  Transportation Needs:   . Lack of Transportation (Medical): Not on file  . Lack of Transportation (Non-Medical): Not on file  Physical Activity:   . Days of Exercise per Week: Not on file  . Minutes of Exercise per Session:  Not on file  Stress:   . Feeling of Stress : Not on file  Social Connections:   . Frequency of Communication with Friends and Family: Not on file  . Frequency of Social Gatherings with Friends and Family: Not on file  . Attends Religious Services: Not on file  . Active Member of Clubs or Organizations: Not on file  . Attends Archivist Meetings: Not on file  . Marital Status: Not on file  Intimate Partner Violence:   . Fear of Current or Ex-Partner: Not on file  . Emotionally Abused: Not on file  . Physically Abused: Not on file  . Sexually Abused: Not on file   Family History  Problem Relation Age of Onset  . Hypertension Mother   . Hyperlipidemia  Mother   . Diabetes Maternal Uncle   . Hypertension Paternal Grandfather   . Hyperlipidemia Paternal Grandfather     Review of Systems  Per HPI.   Objective: Office vital signs reviewed. BP 122/84   Pulse 85   Temp 98.5 F (36.9 C) (Temporal)   Ht 5' (1.524 m)   Wt 149 lb 4 oz (67.7 kg)   BMI 29.15 kg/m   Physical Examination:  Physical Exam Vitals and nursing note reviewed.  Constitutional:      General: She is not in acute distress.    Appearance: Normal appearance. She is not ill-appearing or diaphoretic.  Pulmonary:     Effort: Pulmonary effort is normal. No respiratory distress.  Musculoskeletal:     Right lower leg: No edema.     Left lower leg: No edema.  Skin:    General: Skin is warm and dry.     Findings: Rash present. Rash is urticarial.     Comments: Erythematous slight raised scattered areas to bilateral hands and arms, anterior chest, and upper back.   Neurological:     General: No focal deficit present.     Mental Status: She is alert and oriented to person, place, and time.  Psychiatric:        Mood and Affect: Mood normal.        Behavior: Behavior normal.      Results for orders placed or performed in visit on 11/14/19  Novel Coronavirus, NAA ([facility])   Specimen: Nasopharyngeal(NP) swabs in vial transport medium   Nasopharynge  Screenin  Result Value Ref Range   SARS-CoV-2, NAA Not Detected Not Detected  SARS-COV-2, NAA 2 DAY TAT   Nasopharynge  Screenin  Result Value Ref Range   SARS-CoV-2, NAA 2 DAY TAT Performed      Assessment/ Plan: 24 y.o. female   1. 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 Discussed benadryl and kenalog cream. If no improvement will refer to dermatology.  - triamcinolone cream (KENALOG) 0.1 %; Apply 1 application topically 2 (two) times daily.  Dispense: 45 g; Refill: 0  2. Seafood allergy - Ambulatory referral to Allergy  Return if symptoms worsen or fail to improve.   Encourage [name] to schedule an appointment for a CPE as it has been a few years since her last visit.   The above assessment and management plan was discussed with the patient. The patient verbalized understanding of and has agreed to the management plan. Patient is aware to call the clinic if symptoms persist or worsen. Patient is aware when to return to the clinic for a follow-up visit. Patient educated on when it is appropriate to go to the  emergency department.   Marjorie Smolder, FNP-C [facility] [address] [place], [place] [address] [phone]

## 2020-01-04 ENCOUNTER — Ambulatory Visit
Admission: EM | Admit: 2020-01-04 | Discharge: 2020-01-04 | Disposition: A | Discharge status: Home / Self Care | Payer: BLUE CROSS/BLUE SHIELD | Attending: Emergency Medicine | Admitting: Emergency Medicine

## 2020-01-04 ENCOUNTER — Other Ambulatory Visit: Payer: Self-pay

## 2020-01-04 ENCOUNTER — Encounter: Payer: Self-pay | Admitting: Emergency Medicine

## 2020-01-04 DIAGNOSIS — M546 Pain in thoracic spine: Secondary | ICD-10-CM | POA: Diagnosis not present

## 2020-01-04 LAB — POCT URINALYSIS DIP (MANUAL ENTRY)
Bilirubin, UA: NEGATIVE
Blood, UA: NEGATIVE
Glucose, UA: NEGATIVE mg/dL
Ketones, POC UA: NEGATIVE mg/dL
Leukocytes, UA: NEGATIVE
Nitrite, UA: NEGATIVE
Protein Ur, POC: NEGATIVE mg/dL
Spec Grav, UA: 1.01 (ref 1.010–1.025)
Urobilinogen, UA: 0.2 E.U./dL
pH, UA: 6 (ref 5.0–8.0)

## 2020-01-04 LAB — POCT URINE PREGNANCY: Preg Test, Ur: NEGATIVE

## 2020-01-04 MED ORDER — CYCLOBENZAPRINE HCL 10 MG PO TABS: 10.0000 mg | ORAL_TABLET | Freq: Every day | ORAL | 0 refills | Status: DC

## 2020-01-04 MED ORDER — IBUPROFEN 800 MG PO TABS: 800.0000 mg | ORAL_TABLET | Freq: Three times a day (TID) | ORAL | 0 refills | Status: DC

## 2020-01-04 NOTE — ED Triage Notes (Signed)
Mid back spasms  on both sides of back x 2 days.  Pt reports hx of back pain.  Also reports urinary frequency would like to be checked for uti

## 2020-01-04 NOTE — ED Provider Notes (Signed)
[facility]   [id number] 01/04/20 Arrival Time: 5400  CC: Back PAIN  SUBJECTIVE: History from: patient. Katrina Lin is a 24 y.o. female complains of bilateral mid back pain x 2 days.  Denies a precipitating event or specific injury.  However, does heavy lifting at work.  Localizes the pain to the Xx.  Describes the pain as intermittent and spasm in character.  Has tried OTC medications without relief.  Symptoms are made worse with leaning forward and backwards.  Reports similar symptoms in the past.  Reports urinary frequency as well.  Denies fever, chills, erythema, ecchymosis, effusion, weakness, numbness and tingling, saddle paresthesias, loss of bowel or bladder function.      ROS: As per HPI.  All other pertinent ROS negative.     Past Medical History:  Diagnosis Date  . Anxiety   . Panic attacks    History reviewed. No pertinent surgical history. No Known Allergies No current facility-administered medications on file prior to encounter.   Current Outpatient Medications on File Prior to Encounter  Medication Sig Dispense Refill  . Cholecalciferol (VITAMIN D3 PO) Take by mouth.    . Cyanocobalamin (VITAMIN B-12 PO) Take by mouth.    . nitrofurantoin, macrocrystal-monohydrate, (MACROBID) 100 MG capsule Take 1 capsule (100 mg total) by mouth 2 (two) times daily. 10 capsule 0  . Norethin Ace-Eth Estrad-FE (MINASTRIN 24 FE) 1-20 MG-MCG(24) CHEW Chew 1 tablet by mouth daily. Dispense brand name please 28 tablet 12  . triamcinolone cream (KENALOG) 0.1 % Apply 1 application topically 2 (two) times daily. 45 g 0   Social History   Socioeconomic History  . Marital status: Single    Spouse name: Not on file  . Number of children: Not on file  . Years of education: Not on file  . Highest education level: Not on file  Occupational History  . Not on file  Tobacco Use  . Smoking status: Never Smoker  . Smokeless tobacco: Never Used  Vaping Use  . Vaping Use: Never  used  Substance and Sexual Activity  . Alcohol use: Not Currently  . Drug use: Never  . Sexual activity: Not on file  Other Topics Concern  . Not on file  Social History Narrative  . Not on file   Social Determinants of Health   Financial Resource Strain:   . Difficulty of Paying Living Expenses: Not on file  Food Insecurity:   . Worried About Charity fundraiser in the Last Year: Not on file  . Ran Out of Food in the Last Year: Not on file  Transportation Needs:   . Lack of Transportation (Medical): Not on file  . Lack of Transportation (Non-Medical): Not on file  Physical Activity:   . Days of Exercise per Week: Not on file  . Minutes of Exercise per Session: Not on file  Stress:   . Feeling of Stress : Not on file  Social Connections:   . Frequency of Communication with Friends and Family: Not on file  . Frequency of Social Gatherings with Friends and Family: Not on file  . Attends Religious Services: Not on file  . Active Member of Clubs or Organizations: Not on file  . Attends Archivist Meetings: Not on file  . Marital Status: Not on file  Intimate Partner Violence:   . Fear of Current or Ex-Partner: Not on file  . Emotionally Abused: Not on file  . Physically Abused: Not on file  . Sexually  Abused: Not on file   Family History  Problem Relation Age of Onset  . Hypertension Mother   . Hyperlipidemia Mother   . Diabetes Maternal Uncle   . Hypertension Paternal Grandfather   . Hyperlipidemia Paternal Grandfather     OBJECTIVE:  Vitals:   01/04/20 1156 01/04/20 1203  BP: 112/76   Pulse: 82   Resp: 16   Temp: 98.6 F (37 C)   TempSrc: Oral   SpO2: 97%   Weight:  147 lb (66.7 kg)  Height:  5' (1.524 m)    General appearance: ALERT; in no acute distress.  Head: NCAT Lungs: Normal respiratory effort Musculoskeletal: Back Inspection: Skin warm, dry, clear and intact without obvious erythema, effusion, or ecchymosis.  Palpation: Mildly TTP over  mid back ROM: FROM active and passive Strength: 5/5 shld abduction, 5/5 shld adduction, 5/5 elbow flexion, 5/5 elbow extension, 5/5 grip strength, 5/5 knee flexion, 5/5 knee extension Skin: warm and dry Neurologic: Ambulates without difficulty; Sensation intact about the upper/ lower extremities Psychological: alert and cooperative; normal mood and affect  ASSESSMENT & PLAN:  1. Acute bilateral thoracic back pain    Meds ordered this encounter  Medications  . ibuprofen (ADVIL) 800 MG tablet    Sig: Take 1 tablet (800 mg total) by mouth 3 (three) times daily.    Dispense:  30 tablet    Refill:  0    Order Specific Question:   Supervising Provider    Answer:   Raylene Everts [[id number]]  . cyclobenzaprine (FLEXERIL) 10 MG tablet    Sig: Take 1 tablet (10 mg total) by mouth at bedtime.    Dispense:  15 tablet    Refill:  0    Order Specific Question:   Supervising Provider    Answer:   Raylene Everts [[id number]]    Continue conservative management of rest, ice, and gentle stretches Take ibuprofen 800 mg as needed for pain relief (may cause abdominal discomfort, ulcers, and GI bleeds avoid taking with other NSAIDs) Take cyclobenzaprine at nighttime for symptomatic relief. Avoid driving or operating heavy machinery while using medication. Follow up with PCP if symptoms persist Return or go to the ER if you have any new or worsening symptoms (fever, chills, chest pain, abdominal pain, changes in bowel or bladder habits, pain radiating into lower legs, etc...)    Reviewed expectations re: course of current medical issues. Questions answered. Outlined signs and symptoms indicating need for more acute intervention. Patient verbalized understanding. After Visit Summary given.    Lestine Box, PA-C 01/04/20 1241

## 2020-01-04 NOTE — Discharge Instructions (Signed)
Continue conservative management of rest, ice, and gentle stretches Take ibuprofen 800 mg as needed for pain relief (may cause abdominal discomfort, ulcers, and GI bleeds avoid taking with other NSAIDs) Take cyclobenzaprine at nighttime for symptomatic relief. Avoid driving or operating heavy machinery while using medication. Follow up with PCP if symptoms persist Return or go to the ER if you have any new or worsening symptoms (fever, chills, chest pain, abdominal pain, changes in bowel or bladder habits, pain radiating into lower legs, etc...)

## 2020-01-16 ENCOUNTER — Other Ambulatory Visit: Payer: Self-pay

## 2020-01-16 ENCOUNTER — Encounter: Payer: Self-pay | Admitting: Family Medicine

## 2020-01-16 ENCOUNTER — Ambulatory Visit (INDEPENDENT_AMBULATORY_CARE_PROVIDER_SITE_OTHER): Payer: BLUE CROSS/BLUE SHIELD | Admitting: Family Medicine

## 2020-01-16 VITALS — BP 130/89 | HR 87 | Temp 98.2°F | Ht 61.0 in | Wt 150.0 lb

## 2020-01-16 DIAGNOSIS — Z13228 Encounter for screening for other metabolic disorders: Secondary | ICD-10-CM

## 2020-01-16 DIAGNOSIS — Z0000 Encounter for general adult medical examination without abnormal findings: Secondary | ICD-10-CM

## 2020-01-16 DIAGNOSIS — Z130 Encounter for screening for diseases of the blood and blood-forming organs and certain disorders involving the immune mechanism: Secondary | ICD-10-CM | POA: Diagnosis not present

## 2020-01-16 DIAGNOSIS — Z13220 Encounter for screening for lipoid disorders: Secondary | ICD-10-CM | POA: Diagnosis not present

## 2020-01-16 DIAGNOSIS — Z6828 Body mass index (BMI) 28.0-28.9, adult: Secondary | ICD-10-CM | POA: Diagnosis not present

## 2020-01-16 DIAGNOSIS — Z1329 Encounter for screening for other suspected endocrine disorder: Secondary | ICD-10-CM

## 2020-01-16 DIAGNOSIS — L0292 Furuncle, unspecified: Secondary | ICD-10-CM

## 2020-01-16 DIAGNOSIS — G8929 Other chronic pain: Secondary | ICD-10-CM

## 2020-01-16 DIAGNOSIS — Z0001 Encounter for general adult medical examination with abnormal findings: Secondary | ICD-10-CM | POA: Diagnosis not present

## 2020-01-16 DIAGNOSIS — L0293 Carbuncle, unspecified: Secondary | ICD-10-CM

## 2020-01-16 DIAGNOSIS — M546 Pain in thoracic spine: Secondary | ICD-10-CM

## 2020-01-16 NOTE — Progress Notes (Signed)
Katrina Lin is a 24 y.o. female presents to office today for annual physical exam examination.    Concerns today include:  1. Chronic back pain Katrina Lin reports chronic back pain. She was recently seen at [facility] for this. The pain is in her mid back on both sides. The pain is muscle spasms that come and go. She has been taking ibuprofen with some relief. She has not been taking the flexeril that was prescribed. She has to lift heavy objects frequently at work. She does report pain in her left leg occasionally. Denies numbness tingling in her legs changes in bowel control, injury or trauma. Denies fever, chills, redness, or swelling. They checked her urine at [facility] and she did not have a UTI. She has has lower back pain in the past that responded well to PT and she would like to try PT again.   2. Recurrent boils Katrina Lin reports frequent break out of her bottom with raised red bumps. She denies warmth, tenderness, or drainage from them but does report that they get hard sometimes. She does not currently have one. She has not tried anything for the symptoms. She would like to see a dermatologist.   She is followed by urology for recurrent UTIs.   Occupation: works at [employer], Marital status: single, Substance use: denies Diet: "alright" she eats fruits and vegetables, she doesn't eat a lot sweets, she likes pizza and ice cream, Exercise: starting to again, she is hoping PT will help with this. She has been using resistance bands for work out and walking around the neighborhood Last eye exam: a couple years, she plans to schedule an appointment soon Last dental exam: 2 months Last pap smear: this year at [facility] needed today: Immunizations needed: Flu Vaccine: no, declines   She is not fasting today and will return next week for lab work.   Past Medical History:  Diagnosis Date  . Anxiety   . Panic attacks    Social History   Socioeconomic History  . Marital status:  Single    Spouse name: Not on file  . Number of children: Not on file  . Years of education: Not on file  . Highest education level: Not on file  Occupational History  . Not on file  Tobacco Use  . Smoking status: Never Smoker  . Smokeless tobacco: Never Used  Vaping Use  . Vaping Use: Never used  Substance and Sexual Activity  . Alcohol use: Not Currently  . Drug use: Never  . Sexual activity: Not on file  Other Topics Concern  . Not on file  Social History Narrative  . Not on file   Social Determinants of Health   Financial Resource Strain:   . Difficulty of Paying Living Expenses: Not on file  Food Insecurity:   . Worried About Charity fundraiser in the Last Year: Not on file  . Ran Out of Food in the Last Year: Not on file  Transportation Needs:   . Lack of Transportation (Medical): Not on file  . Lack of Transportation (Non-Medical): Not on file  Physical Activity:   . Days of Exercise per Week: Not on file  . Minutes of Exercise per Session: Not on file  Stress:   . Feeling of Stress : Not on file  Social Connections:   . Frequency of Communication with Friends and Family: Not on file  . Frequency of Social Gatherings with Friends and Family: Not on file  .  Attends Religious Services: Not on file  . Active Member of Clubs or Organizations: Not on file  . Attends Archivist Meetings: Not on file  . Marital Status: Not on file  Intimate Partner Violence:   . Fear of Current or Ex-Partner: Not on file  . Emotionally Abused: Not on file  . Physically Abused: Not on file  . Sexually Abused: Not on file   History reviewed. No pertinent surgical history. Family History  Problem Relation Age of Onset  . Hypertension Mother   . Hyperlipidemia Mother   . Diabetes Maternal Uncle   . Hypertension Paternal Grandfather   . Hyperlipidemia Paternal Grandfather     Current Outpatient Medications:  .  Cholecalciferol (VITAMIN D3 PO), Take by mouth., Disp: ,  Rfl:  .  Cyanocobalamin (VITAMIN B-12 PO), Take by mouth., Disp: , Rfl:  .  ibuprofen (ADVIL) 800 MG tablet, Take 1 tablet (800 mg total) by mouth 3 (three) times daily., Disp: 30 tablet, Rfl: 0 .  nitrofurantoin, macrocrystal-monohydrate, (MACROBID) 100 MG capsule, Take 1 capsule (100 mg total) by mouth 2 (two) times daily., Disp: 10 capsule, Rfl: 0 .  Norethin Ace-Eth Estrad-FE (MINASTRIN 24 FE) 1-20 MG-MCG(24) CHEW, Chew 1 tablet by mouth daily. Dispense brand name please, Disp: 28 tablet, Rfl: 12  No Known Allergies   ROS: Review of Systems Pertinent items noted in HPI and remainder of comprehensive ROS otherwise negative.    Physical exam BP 130/89   Pulse 87   Temp 98.2 F (36.8 C) (Temporal)   Ht _0  (1.549 m)   Wt 150 lb (68 kg)   LMP 11/29/2019 (Approximate)   SpO2 99%   BMI 28.34 kg/m  General appearance: alert, cooperative, appears stated age and no distress Head: Normocephalic, without obvious abnormality, atraumatic Eyes: conjunctivae/corneas clear. PERRL, EOM's intact. Fundi benign. Ears: normal TM's and external ear canals both ears Nose: Nares normal. Septum midline. Mucosa normal. No drainage or sinus tenderness. Throat: lips, mucosa, and tongue normal; teeth and gums normal Neck: no adenopathy, no carotid bruit, no JVD, supple, symmetrical, trachea midline and thyroid not enlarged, symmetric, no tenderness/mass/nodules Back: no tenderness to percussion or palpation, range of motion normal, symmetric, no curvature. ROM normal. No CVA tenderness. Negative bilateral straight leg test.  Lungs: clear to auscultation bilaterally Heart: regular rate and rhythm, S1, S2 normal, no murmur, click, rub or gallop Abdomen: soft, non-tender; bowel sounds normal; no masses,  no organomegaly Extremities: extremities normal, atraumatic, no cyanosis or edema Pulses: 2+ and symmetric   Assessment/ Plan: Katrina Lin was seen today for annual exam and referral.  Diagnoses and all  orders for this visit:  Routine general medical examination at a health care facility BMI 28.0-28.9,adult Will return for labs next week.  -     CMP14+EGFR; Future -     Lipid panel; Future -     Thyroid Panel With TSH; Future -     CBC with Differential/Platelet; Future  Screening for deficiency anemia -     CBC with Differential/Platelet; Future  Screening for lipoid disorders -     Lipid panel; Future  Screening for endocrine, metabolic and immunity disorder -     Thyroid Panel With TSH; Future  Chronic bilateral thoracic back pain Continue ibuprofen and flexeril as needed.  -     Ambulatory referral to Physical Therapy  Recurrent boils -     Ambulatory referral to Dermatology  Record release signed today to obtain recent PAP results.  Counseled on healthy lifestyle choices, including diet (rich in fruits, vegetables and lean meats and low in salt and simple carbohydrates) and exercise (at least 30 minutes of moderate physical activity daily).  Patient to follow up in 1 year for annual exam or sooner if needed.  The above assessment and management plan was discussed with the patient. The patient verbalized understanding of and has agreed to the management plan. Patient is aware to call the clinic if symptoms persist or worsen. Patient is aware when to return to the clinic for a follow-up visit. Patient educated on when it is appropriate to go to the emergency department.   Marjorie Smolder, FNP-C [facility] [address] [place], [place] [address] [phone]

## 2020-01-16 NOTE — Patient Instructions (Signed)
Mediterranean Diet A Mediterranean diet refers to food and lifestyle choices that are based on the traditions of countries located on the [place] Sea. This way of eating has been shown to help prevent certain conditions and improve outcomes for people who have chronic diseases, like kidney disease and heart disease. What are tips for following this plan? Lifestyle  Cook and eat meals together with your family, when possible.  Drink enough fluid to keep your urine clear or pale yellow.  Be physically active every day. This includes: ? Aerobic exercise like running or swimming. ? Leisure activities like gardening, walking, or housework.  Get 7-8 hours of sleep each night.  If recommended by your health care provider, drink red wine in moderation. This means 1 glass a day for nonpregnant women and 2 glasses a day for men. A glass of wine equals 5 oz (150 mL). Reading food labels  Check the serving size of packaged foods. For foods such as rice and pasta, the serving size refers to the amount of cooked product, not dry.  Check the total fat in packaged foods. Avoid foods that have saturated fat or trans fats.  Check the ingredients list for added sugars, such as corn syrup.   Shopping  At the grocery store, buy most of your food from the areas near the walls of the store. This includes: ? Fresh fruits and vegetables (produce). ? Grains, beans, nuts, and seeds. Some of these may be available in unpackaged forms or large amounts (in bulk). ? Fresh seafood. ? Poultry and eggs. ? Low-fat dairy products.  Buy whole ingredients instead of prepackaged foods.  Buy fresh fruits and vegetables in-season from local farmers markets.  Buy frozen fruits and vegetables in resealable bags.  If you do not have access to quality fresh seafood, buy precooked frozen shrimp or canned fish, such as tuna, salmon, or sardines.  Buy small amounts of raw or cooked vegetables, salads, or olives from  the deli or salad bar at your store.  Stock your pantry so you always have certain foods on hand, such as olive oil, canned tuna, canned tomatoes, rice, pasta, and beans. Cooking  Cook foods with extra-virgin olive oil instead of using butter or other vegetable oils.  Have meat as a side dish, and have vegetables or grains as your main dish. This means having meat in small portions or adding small amounts of meat to foods like pasta or stew.  Use beans or vegetables instead of meat in common dishes like chili or lasagna.  Experiment with different cooking methods. Try roasting or broiling vegetables instead of steaming or sauteing them.  Add frozen vegetables to soups, stews, pasta, or rice.  Add nuts or seeds for added healthy fat at each meal. You can add these to yogurt, salads, or vegetable dishes.  Marinate fish or vegetables using olive oil, lemon juice, garlic, and fresh herbs. Meal planning  Plan to eat 1 vegetarian meal one day each week. Try to work up to 2 vegetarian meals, if possible.  Eat seafood 2 or more times a week.  Have healthy snacks readily available, such as: ? Vegetable sticks with hummus. ? Greek yogurt. ? Fruit and nut trail mix.  Eat balanced meals throughout the week. This includes: ? Fruit: 2-3 servings a day ? Vegetables: 4-5 servings a day ? Low-fat dairy: 2 servings a day ? Fish, poultry, or lean meat: 1 serving a day ? Beans and legumes: 2 or more servings a week ?   Nuts and seeds: 1-2 servings a day ? Whole grains: 6-8 servings a day ? Extra-virgin olive oil: 3-4 servings a day  Limit red meat and sweets to only a few servings a month   What are my food choices?  Mediterranean diet ? Recommended  Grains: Whole-grain pasta. Brown rice. Bulgar wheat. Polenta. Couscous. Whole-wheat bread. Oatmeal. Quinoa.  Vegetables: Artichokes. Beets. Broccoli. Cabbage. Carrots. Eggplant. Green beans. Chard. Kale. Spinach. Onions. Leeks. Peas. Squash.  Tomatoes. Peppers. Radishes.  Fruits: Apples. Apricots. Avocado. Berries. Bananas. Cherries. Dates. Figs. Grapes. Lemons. Melon. Oranges. Peaches. Plums. Pomegranate.  Meats and other protein foods: Beans. Almonds. Sunflower seeds. Pine nuts. Peanuts. Cod. Salmon. Scallops. Shrimp. Tuna. Tilapia. Clams. Oysters. Eggs.  Dairy: Low-fat milk. Cheese. Greek yogurt.  Beverages: Water. Red wine. Herbal tea.  Fats and oils: Extra virgin olive oil. Avocado oil. Grape seed oil.  Sweets and desserts: Greek yogurt with honey. Baked apples. Poached pears. Trail mix.  Seasoning and other foods: Basil. Cilantro. Coriander. Cumin. Mint. Parsley. Sage. Rosemary. Tarragon. Garlic. Oregano. Thyme. Pepper. Balsalmic vinegar. Tahini. Hummus. Tomato sauce. Olives. Mushrooms. ? Limit these  Grains: Prepackaged pasta or rice dishes. Prepackaged cereal with added sugar.  Vegetables: Deep fried potatoes (french fries).  Fruits: Fruit canned in syrup.  Meats and other protein foods: Beef. Pork. Lamb. Poultry with skin. Hot dogs. Bacon.  Dairy: Ice cream. Sour cream. Whole milk.  Beverages: Juice. Sugar-sweetened soft drinks. Beer. Liquor and spirits.  Fats and oils: Butter. Canola oil. Vegetable oil. Beef fat (tallow). Lard.  Sweets and desserts: Cookies. Cakes. Pies. Candy.  Seasoning and other foods: Mayonnaise. Premade sauces and marinades. The items listed may not be a complete list. Talk with your dietitian about what dietary choices are right for you. Summary  The Mediterranean diet includes both food and lifestyle choices.  Eat a variety of fresh fruits and vegetables, beans, nuts, seeds, and whole grains.  Limit the amount of red meat and sweets that you eat.  Talk with your health care provider about whether it is safe for you to drink red wine in moderation. This means 1 glass a day for nonpregnant women and 2 glasses a day for men. A glass of wine equals 5 oz (150 mL). This information  is not intended to replace advice given to you by your health care provider. Make sure you discuss any questions you have with your health care provider. Document Revised: 10/23/2015 Document Reviewed: 10/16/2015 Elsevier Patient Education  2020 Elsevier Inc.  

## 2020-01-23 ENCOUNTER — Ambulatory Visit: Payer: BLUE CROSS/BLUE SHIELD | Attending: Family Medicine | Admitting: Physical Therapy

## 2020-01-23 ENCOUNTER — Other Ambulatory Visit: Payer: Self-pay

## 2020-01-23 ENCOUNTER — Encounter: Payer: Self-pay | Admitting: Physical Therapy

## 2020-01-23 DIAGNOSIS — M6281 Muscle weakness (generalized): Secondary | ICD-10-CM | POA: Diagnosis not present

## 2020-01-23 DIAGNOSIS — M546 Pain in thoracic spine: Secondary | ICD-10-CM

## 2020-01-23 DIAGNOSIS — R293 Abnormal posture: Secondary | ICD-10-CM | POA: Insufficient documentation

## 2020-01-23 DIAGNOSIS — M545 Low back pain, unspecified: Secondary | ICD-10-CM | POA: Diagnosis not present

## 2020-01-23 DIAGNOSIS — G8929 Other chronic pain: Secondary | ICD-10-CM | POA: Diagnosis not present

## 2020-01-23 NOTE — Therapy (Signed)
[facility] [place], [place], [address] Phone: [phone]   Fax:  [phone]  Physical Therapy Evaluation  Patient Details  Name: Katrina Lin MRN: [id number] Date of Birth: 16-Dec-1995 Referring Provider (PT): Marjorie Smolder, FNP   Encounter Date: 01/23/2020   PT End of Session - 01/23/20 0943    Visit Number 1    Number of Visits 4    Date for PT Re-Evaluation 02/27/20    Authorization Type BCBS    PT Start Time 0902    PT Stop Time 0944    PT Time Calculation (min) 42 min    Activity Tolerance Patient tolerated treatment well    Behavior During Therapy [facility] for tasks assessed/performed           Past Medical History:  Diagnosis Date  . Anxiety   . Panic attacks     History reviewed. No pertinent surgical history.  There were no vitals filed for this visit.    Subjective Assessment - 01/23/20 1311    Subjective COVID-19 screening performed upon arrival. Patient arrives with chronic mid to low thoracic pain and muscle spasms that exacerbated after lifting boxes at work on 01/04/2020. Patient reported bending forward to lift a box when she felt her back muscles gave out. She went to urgent care where they prescried Ibuprofen and muscle relaxers. Patient reports she takes medication as needed for pain. Patient reports independence with ADLs but with pain. Patient gains assistance for home activities and child care activities as needed. Patient's pain at worst rated as 10/10 and pain at best as 5/10. Patient's goals are to decrease pain, improve movement, improve strength, improve standing tolerance, and improve ability to perform home and work activities    Pertinent History anxiety    Limitations Lifting;House hold activities    Diagnostic tests none since exacerbation of pain    Patient Stated Goals return to PLOF    Currently in Pain? Yes    Pain Score 8     Pain Location Thoracic    Pain Orientation  Mid;Lower    Pain Descriptors / Indicators Contraction;Sharp;Spasm;Aching    Pain Type Chronic pain    Pain Onset More than a month ago    Pain Frequency Constant    Aggravating Factors  bending back or forward    Pain Relieving Factors laying down    Effect of Pain on Daily Activities "difficulties with work and child care"              [facility] PT Assessment - 01/23/20 0001      Assessment   Medical Diagnosis Chronic bilateral thoracic back pain    Referring Provider (PT) Marjorie Smolder, FNP    Onset Date/Surgical Date 01/04/20    Hand Dominance Right    Next MD Visit as needed    Prior Therapy yes      Precautions   Precautions None      Restrictions   Weight Bearing Restrictions No      Balance Screen   Has the patient fallen in the past 6 months Yes    How many times? 1    Has the patient had a decrease in activity level because of a fear of falling?  No    Is the patient reluctant to leave their home because of a fear of falling?  No      Home Social worker Private residence    Living Arrangements Spouse/significant other;Parent  Available Help at Discharge Family      Prior Function   Level of Independence Independent    Vocation Part time employment    Vocation Requirements lifting up to 60 lbs      Posture/Postural Control   Posture/Postural Control Postural limitations    Postural Limitations Rounded Shoulders;Forward head;Increased thoracic kyphosis;Flexed trunk      ROM / Strength   AROM / PROM / Strength Strength      Strength   Overall Strength Deficits;Due to pain    Overall Strength Comments mid traps and lower traps 3+/5 bilaterally    Strength Assessment Site Shoulder;Hip    Right/Left Shoulder Right;Left    Right Shoulder Flexion 3+/5    Right Shoulder Extension 3+/5    Right Shoulder ABduction 3+/5    Right Shoulder Internal Rotation 4/5    Right Shoulder External Rotation 4/5    Left Shoulder Flexion 3+/5    Left  Shoulder Extension 3+/5    Left Shoulder ABduction 3+/5    Left Shoulder Internal Rotation 4/5    Left Shoulder External Rotation 4/5    Right/Left Hip Right;Left    Right Hip Flexion 4-/5    Right Hip Extension 3+/5    Right Hip ABduction 3+/5    Left Hip Flexion 4-/5    Left Hip Extension 3+/5    Left Hip ABduction 3+/5      Palpation   Spinal mobility Decreased PA mobs of thoracic spine in prone    Palpation comment increased thoracolumbar paraspinal tone bilaterally      Transfers   Transfers Independent with all Transfers      Ambulation/Gait   Gait Pattern Within Functional Limits                      Objective measurements completed on examination: See above findings.       [facility] Adult PT Treatment/Exercise - 01/23/20 0001      Modalities   Modalities Electrical Stimulation;Moist Heat      Moist Heat Therapy   Number Minutes Moist Heat 10 Minutes    Moist Heat Location Lumbar Spine      Electrical Stimulation   Electrical Stimulation Location thoracolumbar paraspinals    Electrical Stimulation Action IFC    Electrical Stimulation Parameters 80-150 hz x10 mins    Electrical Stimulation Goals Pain;Tone                  PT Education - 01/23/20 0940    Education Details lat dorsi stretch at wall, supine QL stretch, standing QL stretch in doorway, lumbar ext, lat pull down, supine bridging, lifting techniques    Person(s) Educated Patient    Methods Explanation;Demonstration;Handout    Comprehension Verbalized understanding;Returned demonstration               PT Long Term Goals - 01/23/20 0943      PT LONG TERM GOAL #1   Title Patient will be independent with HEP and its progression    Time 4    Period Weeks    Status New      PT LONG TERM GOAL #2   Title Patient will demonstrate 4+/5 or greater UE strength bilaterally to improve stability during functional tasks and improve posture.    Time 4    Period Weeks    Status New       PT LONG TERM GOAL #3   Title Patient will report ability to perform care giving  activities and home activities with thoracic back pain less than or equal to 3/10.    Time 4    Period Weeks    Status New      PT LONG TERM GOAL #4   Title Patient will demonstrate proper lifting techniques to improve ability to perform work tasks.    Time 4    Period Weeks    Status New                  Plan - 01/23/20 1317    Clinical Impression Statement Patient is a 24 year old right handed female who presents to physical therapy with chronic thoracic pain and low back pain that exacerbated after lifting at work on 01/04/2020. Patient noted with increased thoracolumbar paraspinal tone with tenderness upon palpation. Patient with decreased UE and LE MMT. Patient and PT discussed plan of care with emphasis on lifting techniques, body mechanics and strengthening with emphasis on HEP and independent gym program. Patient reported understanding and agreement. Patient would benefit from skilled physical therapy to address deficits and goals.    Personal Factors and Comorbidities Age;Time since onset of injury/illness/exacerbation    Examination-Activity Limitations Reach Overhead;Carry;Lift    Examination-Participation Restrictions Occupation    Stability/Clinical Decision Making Stable/Uncomplicated    Clinical Decision Making Low    Rehab Potential Good    PT Frequency 1x / week    PT Duration 4 weeks    PT Treatment/Interventions Cryotherapy;Electrical Stimulation;ADLs/Self Care Home Management;Functional mobility training;Therapeutic activities;Therapeutic exercise;Spinal Manipulations;Passive range of motion;Patient/family education;Neuromuscular re-education;Manual techniques;Ultrasound    PT Next Visit Plan focus on body mechanics and lifting techniques. LE strengthening and postural exercise. Advance HEP for strengthening for independence in exercises at the gym    PT Home Exercise Plan see  patient education section    Consulted and Agree with Plan of Care Patient           Patient will benefit from skilled therapeutic intervention in order to improve the following deficits and impairments:  Decreased activity tolerance, Decreased strength, Difficulty walking, Pain, Postural dysfunction  Visit Diagnosis: Pain in thoracic spine - Plan: PT plan of care cert/re-cert  Muscle weakness (generalized) - Plan: PT plan of care cert/re-cert  Chronic midline low back pain, unspecified whether sciatica present - Plan: PT plan of care cert/re-cert  Abnormal posture - Plan: PT plan of care cert/re-cert     Problem List Patient Active Problem List   Diagnosis Date Noted  . Chronic midline low back pain with left-sided sciatica 01/12/2018  . BMI 29.0-29.9,adult 01/12/2018  . Generalized anxiety disorder 11/04/2016    Gabriela Eves, PT, DPT 01/23/2020, 1:29 PM  [facility] [address] [place], [place], [address] Phone: [phone]   Fax:  [phone]  Name: Katrina Lin MRN: [id number] Date of Birth: 1995/07/12

## 2020-01-23 NOTE — Patient Instructions (Signed)
Access Code: [email or web address] URL: [email or web address] Date: 01/23/2020 Prepared by: Gabriela Eves  Exercises Latissimus Dorsi Stretch at Wall - 1 x daily - 7 x weekly - 1 sets - 3 reps - 30" hold Supine Quadratus Lumborum Stretch - 1 x daily - 7 x weekly - 1 sets - 3 reps - 30" hold Standing Quadratus Lumborum Stretch with Doorway - 1 x daily - 7 x weekly - 1 sets - 3 reps - 30" hold Seated Thoracic Lumbar Extension with Pectoralis Stretch - 1 x daily - 7 x weekly - 1 sets - 3 reps - 30" hold Standing Lat Pull Down with Resistance - Elbows Bent - 1 x daily - 7 x weekly - 3 sets - 10 reps - 3" hold Supine Bridge with Resistance Band - 1 x daily - 7 x weekly - 3 sets - 10 reps  Patient Education Lifting Techniques

## 2020-01-28 ENCOUNTER — Encounter: Payer: BLUE CROSS/BLUE SHIELD | Admitting: Physical Therapy

## 2020-02-06 ENCOUNTER — Encounter: Payer: Self-pay | Admitting: Allergy

## 2020-02-06 ENCOUNTER — Other Ambulatory Visit: Payer: Self-pay

## 2020-02-06 ENCOUNTER — Ambulatory Visit: Payer: BLUE CROSS/BLUE SHIELD | Admitting: Allergy

## 2020-02-06 ENCOUNTER — Telehealth: Payer: Self-pay | Admitting: Allergy

## 2020-02-06 VITALS — BP 118/70 | HR 69 | Temp 97.3°F | Resp 18 | Ht 60.5 in | Wt 151.8 lb

## 2020-02-06 DIAGNOSIS — T7800XD Anaphylactic reaction due to unspecified food, subsequent encounter: Secondary | ICD-10-CM

## 2020-02-06 DIAGNOSIS — J310 Chronic rhinitis: Secondary | ICD-10-CM | POA: Diagnosis not present

## 2020-02-06 DIAGNOSIS — H109 Unspecified conjunctivitis: Secondary | ICD-10-CM

## 2020-02-06 NOTE — Telephone Encounter (Signed)
Patient made appointment for 03/27/2019 to have crab challenge and need to know what to bring so please send her the paper work on it. I told her to stay off of antih. For three days before coming.

## 2020-02-06 NOTE — Progress Notes (Signed)
New Patient Note  RE: Katrina Lin MRN: [id number] DOB: 17-Feb-1996 Date of Office Visit: 02/06/2020  Referring provider: Gwenlyn Perking, FNP Primary care provider: Gwenlyn Perking, FNP  Chief Complaint: Seafood allergy  History of present illness: Katrina Lin is a 24 y.o. female presenting today for consultation for food allergy.  She states she wants to see if she is allergic to seafood.  She states before she had her daughter she was able to eat seafood. However since giving birth she states she felt itchy on her arms and developed welts due to scratching, nausea without emesis. She was eating crabs and shrimp at that time of these symptoms.  Symptoms started within a hour of ingestion.  This occurred about 3 years ago.  She states she stopped eating crab after that.  She was eating shrimp still but was noting nausea.  Thus she cut out all seafood including fish.  She states she has never had any issues with fish however.  She would like to be able to eat seafood if possible.  No history of asthma or eczema.  She does report itchy/watery eyes, sneezing mostly during pollen season.  She will take claritin with good relief of symptoms. She states she did develop a rash after using a certain chemical grade cleaner she was using at work.  She saw her PCP with this rash and was prescribed a topical treatment that was effective.  she stopped using this cleaner and rash did improve/resolve.    Review of systems: Review of Systems  Constitutional: Negative.   HENT: Negative.   Eyes: Negative.   Respiratory: Negative.   Cardiovascular: Negative.   Gastrointestinal: Negative.   Musculoskeletal: Negative.   Skin: Negative.   Neurological: Negative.     All other systems negative unless noted above in HPI  Past medical history: Past Medical History:  Diagnosis Date  . Anxiety   . Panic attacks     Past surgical history: Past Surgical History:  Procedure Laterality  Date  . ADENOIDECTOMY    . TONSILLECTOMY      Family history:  Family History  Problem Relation Age of Onset  . Hypertension Mother   . Hyperlipidemia Mother   . Diabetes Maternal Uncle   . Hypertension Paternal Grandfather   . Hyperlipidemia Paternal Grandfather     Social history: Lives in a home without carpeting with electric heating and central, window and fan cooling.  2 dogs in the home.  Chicken outside the home.  She is a Chemical engineer.  She denies a smoking history.  Medication List: Current Outpatient Medications  Medication Sig Dispense Refill  . Cholecalciferol (VITAMIN D3 PO) Take by mouth.    . Cyanocobalamin (VITAMIN B-12 PO) Take by mouth.    Marland Kitchen ibuprofen (ADVIL) 800 MG tablet Take 1 tablet (800 mg total) by mouth 3 (three) times daily. 30 tablet 0  . nitrofurantoin, macrocrystal-monohydrate, (MACROBID) 100 MG capsule Take 1 capsule (100 mg total) by mouth 2 (two) times daily. 10 capsule 0  . Norethin Ace-Eth Estrad-FE (MINASTRIN 24 FE) 1-20 MG-MCG(24) CHEW Chew 1 tablet by mouth daily. Dispense brand name please 28 tablet 12   No current facility-administered medications for this visit.    Known medication allergies: Allergies  Allergen Reactions  . Oyster Shell     Positive skin allergy test 02/06/20.      Physical examination: Blood pressure 118/70, pulse 69, temperature (!) 97.3 F (36.3 C), temperature source Temporal, resp. rate 18,  height 5' 0.5" (1.537 m), weight 151 lb 12.8 oz (68.9 kg), SpO2 98 %.  General: Alert, interactive, in no acute distress. HEENT: PERRLA, TMs pearly gray, turbinates non-edematous without discharge, post-pharynx non erythematous. Neck: Supple without lymphadenopathy. Lungs: Clear to auscultation without wheezing, rhonchi or rales. {no increased work of breathing. CV: Normal S1, S2 without murmurs. Abdomen: Nondistended, nontender. Skin: Warm and dry, without lesions or rashes. Extremities:  No clubbing, cyanosis or  edema. Neuro:   Grossly intact.  Diagnositics/Labs:  Allergy testing: Select food allergy skin prick testing has slight reactivity to oyster. Allergy testing results were read and interpreted by provider, documented by clinical staff.   Assessment and plan: Anaphylaxis due to food     - skin testing today to fish and shellfish panel shows slight reactivity to oysters    - will obtain fish and shellfish IgE panels.  If negative or low levels then will be able to perform in-office food challenge to determine true allergy    - continue avoidance of fish and shellfish until able to challenge in office    - have access to self-injectable epinephrine (Epipen or AuviQ) 0.3mg  at all times    - follow emergency action plan in case of allergic reaction  Rhinoconjunctivitis, presumed allergic    - can continue as needed use of Claritin 10 mg daily for general allergy symptom relief    - for itchy, watery eyes can use over-the-counter Pataday 1 drop each eye daily as needed    - let us know if you would like to have environmental allergy skin testing in the future  Follow-up in 6 to 12 months or sooner if needed  I appreciate the opportunity to take part in Katrina Lin's care. Please do not hesitate to contact me with questions.  Sincerely,   Prudy Feeler, MD Allergy/Immunology [facility]

## 2020-02-06 NOTE — Patient Instructions (Addendum)
-   skin testing today to fish and shellfish panel shows slight reactivity to oysters    - will obtain fish and shellfish IgE panels.  If negative or low levels then will be able to perform in-office food challenge to determine true allergy    - continue avoidance of fish and shellfish until able to challenge in office    - have access to self-injectable epinephrine (Epipen or AuviQ) 0.3mg  at all times    - follow emergency action plan in case of allergic reaction     - can continue as needed use of Claritin 10 mg daily for general allergy symptom relief    - for itchy, watery eyes can use over-the-counter Pataday 1 drop each eye daily as needed    - let us know if you would like to have environmental allergy skin testing in the future  Follow-up in 6 to 12 months or sooner if needed

## 2020-02-06 NOTE — Telephone Encounter (Signed)
Called and spoke to patient to inform her that she should bring one serving size of cooked crab meat and to be sure that the meat is deshelled and boneless. Patient verbalized understanding. I went out the paperwork for patient to read over.

## 2020-02-08 LAB — ALLERGEN PROFILE, FOOD-FISH
Allergen Mackerel IgE: <0.1 kU/L
Allergen Salmon IgE: <0.1 kU/L
Allergen Trout IgE: <0.1 kU/L
Allergen Walley Pike IgE: <0.1 kU/L
Codfish IgE: <0.1 kU/L
Halibut IgE: <0.1 kU/L
Tuna: <0.1 kU/L

## 2020-02-08 LAB — ALLERGEN PROFILE, SHELLFISH
Clam IgE: <0.1 kU/L
F023-IgE Crab: <0.1 kU/L
F080-IgE Lobster: <0.1 kU/L
F290-IgE Oyster: <0.1 kU/L
Scallop IgE: 0.18 kU/L — AB
Shrimp IgE: 1.19 kU/L — AB

## 2020-02-12 ENCOUNTER — Ambulatory Visit: Payer: BLUE CROSS/BLUE SHIELD | Attending: Family Medicine | Admitting: Physical Therapy

## 2020-02-12 ENCOUNTER — Encounter: Payer: Self-pay | Admitting: Physical Therapy

## 2020-02-12 ENCOUNTER — Other Ambulatory Visit: Payer: Self-pay

## 2020-02-12 DIAGNOSIS — M546 Pain in thoracic spine: Secondary | ICD-10-CM | POA: Diagnosis not present

## 2020-02-12 DIAGNOSIS — M6281 Muscle weakness (generalized): Secondary | ICD-10-CM | POA: Diagnosis not present

## 2020-02-12 NOTE — Therapy (Signed)
[facility] [place], [place], [address] Phone: [phone]   Fax:  [phone]  Physical Therapy Treatment  Patient Details  Name: Katrina Lin MRN: [id number] Date of Birth: 06-25-1995 Referring Provider (PT): Marjorie Smolder, FNP   Encounter Date: 02/12/2020   PT End of Session - 02/12/20 1535    Visit Number 2    Number of Visits 4    Date for PT Re-Evaluation 02/27/20    Authorization Type BCBS    PT Start Time 7741    PT Stop Time 1557    PT Time Calculation (min) 40 min    Activity Tolerance Patient tolerated treatment well    Behavior During Therapy [facility] for tasks assessed/performed           Past Medical History:  Diagnosis Date  . Anxiety   . Panic attacks     Past Surgical History:  Procedure Laterality Date  . ADENOIDECTOMY    . TONSILLECTOMY      There were no vitals filed for this visit.   Subjective Assessment - 02/12/20 1517    Subjective COVID 19 screening performed on patient upon arrival. Reports 6/10 pain.    Pertinent History anxiety    Limitations Lifting;House hold activities    Diagnostic tests none since exacerbation of pain    Patient Stated Goals return to PLOF    Currently in Pain? Yes    Pain Score 6     Pain Location Thoracic    Pain Orientation Mid    Pain Descriptors / Indicators Discomfort    Pain Type Chronic pain    Pain Onset More than a month ago    Pain Frequency Constant              [facility] PT Assessment - 02/12/20 0001      Assessment   Medical Diagnosis Chronic bilateral thoracic back pain    Referring Provider (PT) Marjorie Smolder, FNP    Onset Date/Surgical Date 01/04/20    Hand Dominance Right    Next MD Visit as needed    Prior Therapy yes      Precautions   Precautions None      Restrictions   Weight Bearing Restrictions No                         [facility] Adult PT Treatment/Exercise - 02/12/20 0001      Exercises    Exercises Shoulder;Lumbar      Shoulder Exercises: Seated   Horizontal ABduction Strengthening;Both;20 reps;Theraband    Theraband Level (Shoulder Horizontal ABduction) Level 2 (Red)    External Rotation Strengthening;Both;20 reps;Theraband    Theraband Level (Shoulder External Rotation) Level 2 (Red)    Diagonals Strengthening;Both;20 reps;Theraband    Theraband Level (Shoulder Diagonals) Level 2 (Red)      Shoulder Exercises: Standing   Other Standing Exercises wall walks red theraband x2 RT      Shoulder Exercises: ROM/Strengthening   UBE (Upper Arm Bike) 120 RPM x8 min    Cybex Press 2 plate;20 reps    Cybex Row 2 plate;20 reps    Wall Pushups 20 reps    Other ROM/Strengthening Exercises Lat pulldown 20# 2x10 reps                       PT Long Term Goals - 01/23/20 0943      PT LONG TERM GOAL #1   Title Patient will  be independent with HEP and its progression    Time 4    Period Weeks    Status New      PT LONG TERM GOAL #2   Title Patient will demonstrate 4+/5 or greater UE strength bilaterally to improve stability during functional tasks and improve posture.    Time 4    Period Weeks    Status New      PT LONG TERM GOAL #3   Title Patient will report ability to perform care giving activities and home activities with thoracic back pain less than or equal to 3/10.    Time 4    Period Weeks    Status New      PT LONG TERM GOAL #4   Title Patient will demonstrate proper lifting techniques to improve ability to perform work tasks.    Time 4    Period Weeks    Status New                 Plan - 02/12/20 1605    Clinical Impression Statement Patient presented in clinic today with 6/10 thoracic pain. Patient progressed through various shoulder/thoracic strengthening with greatest emphasis on scapular retraction. No pain reported during therex session. Patient highly encouraged to complete HEP as directed.    Personal Factors and Comorbidities  Age;Time since onset of injury/illness/exacerbation    Examination-Activity Limitations Reach Overhead;Carry;Lift    Examination-Participation Restrictions Occupation    Stability/Clinical Decision Making Stable/Uncomplicated    Rehab Potential Good    PT Frequency 1x / week    PT Duration 4 weeks    PT Treatment/Interventions Cryotherapy;Electrical Stimulation;ADLs/Self Care Home Management;Functional mobility training;Therapeutic activities;Therapeutic exercise;Spinal Manipulations;Passive range of motion;Patient/family education;Neuromuscular re-education;Manual techniques;Ultrasound    PT Next Visit Plan focus on body mechanics and lifting techniques. LE strengthening and postural exercise. Advance HEP for strengthening for independence in exercises at the gym    PT Home Exercise Plan see patient education section    Consulted and Agree with Plan of Care Patient           Patient will benefit from skilled therapeutic intervention in order to improve the following deficits and impairments:  Decreased activity tolerance, Decreased strength, Difficulty walking, Pain, Postural dysfunction  Visit Diagnosis: Pain in thoracic spine  Muscle weakness (generalized)     Problem List Patient Active Problem List   Diagnosis Date Noted  . Chronic midline low back pain with left-sided sciatica 01/12/2018  . BMI 29.0-29.9,adult 01/12/2018  . Generalized anxiety disorder 11/04/2016    Standley Brooking, PTA 02/12/2020, 4:10 PM  [facility] [address] [place], [place], [address] Phone: [phone]   Fax:  [phone]  Name: Shakyia Bosso MRN: [id number] Date of Birth: [birth year]

## 2020-02-14 ENCOUNTER — Other Ambulatory Visit: Payer: BLUE CROSS/BLUE SHIELD

## 2020-02-14 ENCOUNTER — Other Ambulatory Visit: Payer: Self-pay

## 2020-02-14 DIAGNOSIS — Z20822 Contact with and (suspected) exposure to covid-19: Secondary | ICD-10-CM | POA: Diagnosis not present

## 2020-02-16 LAB — NOVEL CORONAVIRUS, NAA: SARS-CoV-2, NAA: NOT DETECTED

## 2020-02-16 LAB — SARS-COV-2, NAA 2 DAY TAT

## 2020-02-16 LAB — SPECIMEN STATUS REPORT

## 2020-02-21 ENCOUNTER — Other Ambulatory Visit: Payer: Self-pay

## 2020-02-21 ENCOUNTER — Ambulatory Visit: Payer: BLUE CROSS/BLUE SHIELD | Admitting: Physical Therapy

## 2020-02-21 DIAGNOSIS — M546 Pain in thoracic spine: Secondary | ICD-10-CM

## 2020-02-21 DIAGNOSIS — M6281 Muscle weakness (generalized): Secondary | ICD-10-CM

## 2020-02-21 NOTE — Therapy (Signed)
[facility] [place], [place], [address] Phone: [phone]   Fax:  [phone]  Physical Therapy Treatment  Patient Details  Name: Katrina Lin MRN: [id number] Date of Birth: 15-Oct-1995 Referring Provider (PT): Marjorie Smolder, FNP   Encounter Date: 02/21/2020   PT End of Session - 02/21/20 1527    Visit Number 3    Number of Visits 4    Date for PT Re-Evaluation 02/27/20    Authorization Type BCBS    PT Start Time 0315    PT Stop Time 0356    PT Time Calculation (min) 41 min    Activity Tolerance Patient tolerated treatment well    Behavior During Therapy [facility] for tasks assessed/performed           Past Medical History:  Diagnosis Date  . Anxiety   . Panic attacks     Past Surgical History:  Procedure Laterality Date  . ADENOIDECTOMY    . TONSILLECTOMY      There were no vitals filed for this visit.   Subjective Assessment - 02/21/20 1526    Subjective COVID 19 screening performed on patient upon arrival. Patient arrived with ongoing pain.    Pertinent History anxiety    Limitations Lifting;House hold activities    Diagnostic tests none since exacerbation of pain    Currently in Pain? Yes    Pain Score 5     Pain Location Thoracic    Pain Orientation Mid    Pain Descriptors / Indicators Discomfort    Pain Type Chronic pain    Pain Onset More than a month ago    Pain Frequency Constant    Aggravating Factors  increased activity/bending    Pain Relieving Factors at rest                             [facility] Adult PT Treatment/Exercise - 02/21/20 0001      Lumbar Exercises: Standing   Scapular Retraction Strengthening;Both;20 reps    Scapular Retraction Limitations Blue XTS    Shoulder Extension Strengthening;Both;20 reps    Shoulder Extension Limitations Blue XTS    Other Standing Lumbar Exercises Diagnols Blue XTS 2x20    Other Standing Lumbar Exercises lifting and lowering,  powerlift with 14# box floor to waist level      Shoulder Exercises: Seated   Horizontal ABduction Strengthening;Both;20 reps;Theraband    Theraband Level (Shoulder Horizontal ABduction) Level 2 (Red)    Diagonals Strengthening;Both;20 reps;Theraband    Theraband Level (Shoulder Diagonals) Level 2 (Red)      Shoulder Exercises: ROM/Strengthening   UBE (Upper Arm Bike) 120 RPM x8 min    Cybex Press 2 plate;20 reps    Cybex Row 2 plate;20 reps    Other ROM/Strengthening Exercises Lat pulldown 20# 2x10 reps                       PT Long Term Goals - 02/21/20 1528      PT LONG TERM GOAL #1   Title Patient will be independent with HEP and its progression    Baseline Met 02/21/20    Time 4    Period Weeks    Status Achieved      PT LONG TERM GOAL #2   Title Patient will demonstrate 4+/5 or greater UE strength bilaterally to improve stability during functional tasks and improve posture.    Time 4  Period Weeks    Status On-going      PT LONG TERM GOAL #3   Title Patient will report ability to perform care giving activities and home activities with thoracic back pain less than or equal to 3/10.    Baseline 5/10 02/21/20    Time 4    Period Weeks    Status On-going      PT LONG TERM GOAL #4   Title Patient will demonstrate proper lifting techniques to improve ability to perform work tasks.    Baseline Met 02/21/20    Time 4    Period Weeks    Status Achieved                 Plan - 02/21/20 1551    Clinical Impression Statement Patient tolerated treatment well today. Patient has reported doing well with HEP and will be joining gym. Patient able to progress with core and lifting technique today. Patient met LTG #1 and #4 today with remaining goals progressing.    Personal Factors and Comorbidities Age;Time since onset of injury/illness/exacerbation    Examination-Activity Limitations Reach Overhead;Carry;Lift    Examination-Participation Restrictions  Occupation    Stability/Clinical Decision Making Stable/Uncomplicated    Rehab Potential Good    PT Frequency 1x / week    PT Duration 4 weeks    PT Treatment/Interventions Cryotherapy;Electrical Stimulation;ADLs/Self Care Home Management;Functional mobility training;Therapeutic activities;Therapeutic exercise;Spinal Manipulations;Passive range of motion;Patient/family education;Neuromuscular re-education;Manual techniques;Ultrasound    PT Next Visit Plan cont with POC for  body mechanics and lifting techniques. LE strengthening and postural exercise. Advance HEP for strengthening for independence in exercises at the gym    Consulted and Agree with Plan of Care Patient           Patient will benefit from skilled therapeutic intervention in order to improve the following deficits and impairments:  Decreased activity tolerance,Decreased strength,Difficulty walking,Pain,Postural dysfunction  Visit Diagnosis: Pain in thoracic spine  Muscle weakness (generalized)     Problem List Patient Active Problem List   Diagnosis Date Noted  . Chronic midline low back pain with left-sided sciatica 01/12/2018  . BMI 29.0-29.9,adult 01/12/2018  . Generalized anxiety disorder 11/04/2016    Phillips Climes, PTA 02/21/2020, 3:58 PM  [facility] [place], [place], [address] Phone: [phone]   Fax:  [phone]  Name: Katrina Lin MRN: [id number] Date of Birth: 09-21-1995

## 2020-03-06 ENCOUNTER — Other Ambulatory Visit: Payer: BLUE CROSS/BLUE SHIELD

## 2020-03-06 DIAGNOSIS — Z20822 Contact with and (suspected) exposure to covid-19: Secondary | ICD-10-CM

## 2020-03-08 LAB — NOVEL CORONAVIRUS, NAA: SARS-CoV-2, NAA: NOT DETECTED

## 2020-03-08 LAB — SARS-COV-2, NAA 2 DAY TAT

## 2020-03-25 ENCOUNTER — Other Ambulatory Visit: Payer: BLUE CROSS/BLUE SHIELD

## 2020-03-26 ENCOUNTER — Other Ambulatory Visit: Payer: BLUE CROSS/BLUE SHIELD

## 2020-03-26 ENCOUNTER — Other Ambulatory Visit: Payer: Self-pay

## 2020-03-26 ENCOUNTER — Encounter: Payer: BLUE CROSS/BLUE SHIELD | Admitting: Allergy

## 2020-03-26 DIAGNOSIS — Z20822 Contact with and (suspected) exposure to covid-19: Secondary | ICD-10-CM

## 2020-03-28 ENCOUNTER — Other Ambulatory Visit: Payer: BLUE CROSS/BLUE SHIELD

## 2020-03-29 LAB — NOVEL CORONAVIRUS, NAA

## 2020-04-01 ENCOUNTER — Other Ambulatory Visit: Payer: Self-pay

## 2020-04-01 ENCOUNTER — Other Ambulatory Visit: Payer: BLUE CROSS/BLUE SHIELD

## 2020-04-01 DIAGNOSIS — Z20822 Contact with and (suspected) exposure to covid-19: Secondary | ICD-10-CM | POA: Diagnosis not present

## 2020-04-02 LAB — SARS-COV-2, NAA 2 DAY TAT

## 2020-04-02 LAB — NOVEL CORONAVIRUS, NAA: SARS-CoV-2, NAA: NOT DETECTED

## 2020-05-15 ENCOUNTER — Other Ambulatory Visit: Payer: Self-pay

## 2020-05-15 ENCOUNTER — Ambulatory Visit: Payer: BLUE CROSS/BLUE SHIELD | Admitting: Allergy

## 2020-05-15 ENCOUNTER — Encounter: Payer: Self-pay | Admitting: Allergy

## 2020-05-15 VITALS — BP 118/80 | HR 86 | Temp 98.1°F | Resp 16 | Ht 60.0 in | Wt 151.6 lb

## 2020-05-15 DIAGNOSIS — J310 Chronic rhinitis: Secondary | ICD-10-CM | POA: Diagnosis not present

## 2020-05-15 DIAGNOSIS — H109 Unspecified conjunctivitis: Secondary | ICD-10-CM

## 2020-05-15 DIAGNOSIS — T7800XD Anaphylactic reaction due to unspecified food, subsequent encounter: Secondary | ICD-10-CM

## 2020-05-15 MED ORDER — EPINEPHRINE 0.3 MG/0.3ML IJ SOAJ: 0.3000 mg | Freq: Once | INTRAMUSCULAR | 2 refills | Status: AC

## 2020-05-15 NOTE — Progress Notes (Signed)
Follow-up Note  RE: Katrina Lin MRN: [id number] DOB: [birth year] Date of Office Visit: 05/15/2020   History of present illness: Katrina Lin is a 25 y.o. female presenting today for food challenge.  She was last seen in the office on 02/06/2020 by myself.  She states she has been doing well since her last visit without any major health changes, surgeries or hospitalizations.  She states she is nervous and anxious for today's challenge.  She however would like to be able to eat crab again if possible.  She brought into crab legs today for the challenge.  She has held antihistamines for the past 3 days at least for today's challenge.  She has not had any recent illness or antibiotic needs.  Her skin and serum IgE testing was negative to crab from 02/06/2020.     Review of systems: Review of Systems  Constitutional: Negative.   HENT: Negative.   Eyes: Negative.   Respiratory: Negative.   Cardiovascular: Negative.   Gastrointestinal: Negative.   Musculoskeletal: Negative.   Skin: Negative.   Neurological: Negative.   Psychiatric/Behavioral: The patient is nervous/anxious.     All other systems negative unless noted above in HPI  Past medical/social/surgical/family history have been reviewed and are unchanged unless specifically indicated below.  No changes  Medication List: Current Outpatient Medications  Medication Sig Dispense Refill  . EPINEPHrine 0.3 mg/0.3 mL IJ SOAJ injection Inject 0.3 mg into the muscle once for 1 dose. 0.3 mL 2  . Cholecalciferol (VITAMIN D3 PO) Take by mouth.    . Cyanocobalamin (VITAMIN B-12 PO) Take by mouth.    Marland Kitchen ibuprofen (ADVIL) 800 MG tablet Take 1 tablet (800 mg total) by mouth 3 (three) times daily. 30 tablet 0  . nitrofurantoin, macrocrystal-monohydrate, (MACROBID) 100 MG capsule Take 1 capsule (100 mg total) by mouth 2 (two) times daily. 10 capsule 0  . Norethin Ace-Eth Estrad-FE (MINASTRIN 24 FE) 1-20 MG-MCG(24) CHEW Chew 1 tablet  by mouth daily. Dispense brand name please 28 tablet 12   No current facility-administered medications for this visit.     Known medication allergies: Allergies  Allergen Reactions  . Oyster Shell     Positive skin allergy test 02/06/20.      Physical examination: Blood pressure 118/80, pulse 86, temperature 98.1 F (36.7 C), resp. rate 16, height 5' (1.524 m), weight 151 lb 9.6 oz (68.8 kg), SpO2 98 %.  General: Alert, interactive, in no acute distress. HEENT: PERRLA, TMs pearly gray, turbinates non-edematous without discharge, post-pharynx non erythematous. Neck: Supple without lymphadenopathy. Lungs: Clear to auscultation without wheezing, rhonchi or rales. {no increased work of breathing. CV: Normal S1, S2 without murmurs. Abdomen: Nondistended, nontender. Skin: Warm and dry, without lesions or rashes. Extremities:  No clubbing, cyanosis or edema. Neuro:   Grossly intact.  Diagnositics/Labs: Food challenge to   crab   with use of fresh crab leg      . Benefits and risks of challenge discussed and consent obtained.    She was provided with increasing doses of crab every 15 minutes and consumed total of   17 teaspoons equivalent of crab leg.  She was observed for additional hour after completion of ingestion challenge.  She had no signs/symptoms of allergic reaction.  Vitals were obtained prior to discharge and remained stable.     Assessment and plan: Anaphylaxis due to food      -Challenge today to crab was successfully passed.  Thus she is considered not allergic to  crab.  Advised that she can put crab in her diet going forward.  Advised on daily work-up today but tomorrow onwards she can have crab product in the diet.  Recommend she eat crab at least couple of times a month to maintain tolerance.    - continue avoidance shrimp until able to challenge in office    -With both negative skin testing and blood testing to fish recommended that she could introduce this at home if  she would like to.  If she is not comfortable then also advised she can perform this in the office    - have access to self-injectable epinephrine (Epipen or AuviQ) 0.3mg  at all times    - follow emergency action plan in case of allergic reaction  Rhinoconjunctivitis    - can continue as needed use of Claritin 10 mg daily for general allergy symptom relief    - for itchy, watery eyes can use over-the-counter Pataday 1 drop each eye daily as needed    - let us know if you would like to have environmental allergy skin testing in the future  Follow-up in 6 to 12 months or sooner if needed  I appreciate the opportunity to take part in Loucile's care. Please do not hesitate to contact me with questions.  Sincerely,   Prudy Feeler, MD Allergy/Immunology [facility] [place]

## 2020-05-15 NOTE — Patient Instructions (Addendum)
-  Challenge today to crab was successfully passed.  Thus she is considered not allergic to crab.  Advised that she can put crab in her diet going forward.  Advised on daily work-up today but tomorrow onwards she can have crab product in the diet.  Recommend she eat crab at least couple of times a month to maintain tolerance.    - continue avoidance shrimp until able to challenge in office    -With both negative skin testing and blood testing to fish recommended that she could introduce this at home if she would like to.  If she is not comfortable then also advised she can perform this in the office    - have access to self-injectable epinephrine (Epipen or AuviQ) 0.3mg  at all times    - follow emergency action plan in case of allergic reaction     - can continue as needed use of Claritin 10 mg daily for general allergy symptom relief    - for itchy, watery eyes can use over-the-counter Pataday 1 drop each eye daily as needed    - let us know if you would like to have environmental allergy skin testing in the future  Follow-up in 6 to 12 months or sooner if needed

## 2020-05-19 ENCOUNTER — Telehealth: Payer: Self-pay | Admitting: Allergy

## 2020-05-19 ENCOUNTER — Other Ambulatory Visit: Payer: Self-pay | Admitting: *Deleted

## 2020-05-19 MED ORDER — EPINEPHRINE 0.3 MG/0.3ML IJ SOAJ: 0.3000 mg | Freq: Once | INTRAMUSCULAR | 2 refills | Status: AC

## 2020-05-19 NOTE — Telephone Encounter (Signed)
Correct prescription for Cammie Sickle has been sent to [facility].

## 2020-05-19 NOTE — Telephone Encounter (Signed)
Jasmine from [facility] needs clarification on medication called in. States Epi Pen was called in instead of Auvi-Q. Call back number is [phone].  Please advise.

## 2020-05-23 ENCOUNTER — Other Ambulatory Visit: Payer: Self-pay | Admitting: *Deleted

## 2020-05-23 MED ORDER — EPINEPHRINE 0.3 MG/0.3ML IJ SOAJ: 0.3000 mg | Freq: Once | INTRAMUSCULAR | 1 refills | Status: AC

## 2020-06-06 ENCOUNTER — Other Ambulatory Visit: Payer: Self-pay | Admitting: *Deleted

## 2020-06-06 DIAGNOSIS — G8929 Other chronic pain: Secondary | ICD-10-CM

## 2020-06-23 ENCOUNTER — Ambulatory Visit: Payer: BLUE CROSS/BLUE SHIELD | Admitting: Dermatology

## 2020-06-24 ENCOUNTER — Other Ambulatory Visit: Payer: Self-pay

## 2020-06-24 ENCOUNTER — Ambulatory Visit: Payer: BLUE CROSS/BLUE SHIELD | Admitting: Dermatology

## 2020-06-24 ENCOUNTER — Encounter: Payer: Self-pay | Admitting: Dermatology

## 2020-06-24 DIAGNOSIS — D2339 Other benign neoplasm of skin of other parts of face: Secondary | ICD-10-CM

## 2020-06-24 DIAGNOSIS — D239 Other benign neoplasm of skin, unspecified: Secondary | ICD-10-CM

## 2020-06-24 DIAGNOSIS — L700 Acne vulgaris: Secondary | ICD-10-CM

## 2020-06-24 MED ORDER — FINACEA 15 % EX FOAM: 1.0000 "application " | Freq: Every day | CUTANEOUS | 2 refills | Status: DC

## 2020-06-24 MED ORDER — ARAZLO 0.045 % EX LOTN: 1.0000 "application " | TOPICAL_LOTION | Freq: Every day | CUTANEOUS | 2 refills | Status: DC

## 2020-07-04 ENCOUNTER — Encounter: Payer: Self-pay | Admitting: Dermatology

## 2020-07-04 NOTE — Progress Notes (Signed)
   New Patient   Subjective  Katrina Lin is a 25 y.o. female who presents for the following: Acne (Really just face only- some on back but mainly face- tx -otc products only).  Acne with postinflammatory hyperpigmentation Location:  Duration:  Quality:  Associated Signs/Symptoms: Modifying Factors: Over-the-counter products without much change2Severity:  Timing: Context:    The following portions of the chart were reviewed this encounter and updated as appropriate:  Tobacco  Allergies  Meds  Problems  Med Hx  Surg Hx  Fam Hx      Objective  Well appearing patient in no apparent distress; mood and affect are within normal limits. Objective  Head - Anterior (Face): Mixed inflammatory (mostly superficial) plus small comedones.  Moderate postinflammatory hyperpigmentation.  Objective  Left Chin: Large indented pore    A focused examination was performed including Neck, neck, upper torso.. Relevant physical exam findings are noted in the Assessment and Plan.   Assessment & Plan  Acne vulgaris Head - Anterior (Face)  Detailed explanation about the etiology and essentially all treatment options for this, disorder.  Initially well do 6 to 8-week trial with combination topical Arazlo plus azelaic acid in the hope that we can control both the active acne and start to see improvement in the pigmentary changes.  Recheck 2 months.  Tazarotene (ARAZLO) 0.045 % LOTN - Head - Anterior (Face)  Azelaic Acid (FINACEA) 15 % FOAM - Head - Anterior (Face)  Dilated pore of Winer Left Chin  Benign no treatment needed.

## 2020-07-21 DIAGNOSIS — H40033 Anatomical narrow angle, bilateral: Secondary | ICD-10-CM | POA: Diagnosis not present

## 2020-07-21 DIAGNOSIS — H04123 Dry eye syndrome of bilateral lacrimal glands: Secondary | ICD-10-CM | POA: Diagnosis not present

## 2020-08-10 ENCOUNTER — Ambulatory Visit
Admission: EM | Admit: 2020-08-10 | Discharge: 2020-08-10 | Disposition: A | Discharge status: Home / Self Care | Payer: BLUE CROSS/BLUE SHIELD | Attending: Family Medicine | Admitting: Family Medicine

## 2020-08-10 ENCOUNTER — Other Ambulatory Visit: Payer: Self-pay

## 2020-08-10 DIAGNOSIS — M5442 Lumbago with sciatica, left side: Secondary | ICD-10-CM | POA: Insufficient documentation

## 2020-08-10 DIAGNOSIS — M5441 Lumbago with sciatica, right side: Secondary | ICD-10-CM | POA: Insufficient documentation

## 2020-08-10 LAB — POCT URINALYSIS DIP (MANUAL ENTRY)
Bilirubin, UA: NEGATIVE
Glucose, UA: NEGATIVE mg/dL
Leukocytes, UA: NEGATIVE
Nitrite, UA: NEGATIVE
Protein Ur, POC: NEGATIVE mg/dL
Spec Grav, UA: 1.02 (ref 1.010–1.025)
Urobilinogen, UA: 0.2 E.U./dL
pH, UA: 6 (ref 5.0–8.0)

## 2020-08-10 MED ORDER — NAPROXEN 375 MG PO TABS: 375.0000 mg | ORAL_TABLET | Freq: Two times a day (BID) | ORAL | 0 refills | Status: DC

## 2020-08-10 NOTE — ED Triage Notes (Signed)
Patient presents to Urgent Care with complaints of back pain x 4 days ago. Pt unsure if back pain related to possible UTI or from heavy lifting at work. She states she has a hx of UTI and has noted increased urgency.  Denies fever, abdominal pain or hematuria.

## 2020-08-10 NOTE — Discharge Instructions (Signed)
Heating pad and lidocaine patches.

## 2020-08-10 NOTE — ED Provider Notes (Signed)
[facility]    CSN: [id number] Arrival date & time: 08/10/20  1213      History   Chief Complaint Chief Complaint  Patient presents with  . Back Pain    HPI Katrina Lin is a 25 y.o. female.   HPI  Patient in today for evaluation of back pain.  He was concerned that may be a urinary tract infection.  She has been taking naproxen previously for back pain however she ran out of the prescription.  She is unable to tolerate muscle relaxers due to the sedative nature of the medication.  She endorses some urinary urgency although no dysuria.   Past Medical History:  Diagnosis Date  . Anxiety   . Panic attacks     Patient Active Problem List   Diagnosis Date Noted  . Chronic midline low back pain with left-sided sciatica 01/12/2018  . BMI 29.0-29.9,adult 01/12/2018  . Generalized anxiety disorder 11/04/2016    Past Surgical History:  Procedure Laterality Date  . ADENOIDECTOMY    . TONSILLECTOMY      OB History   No obstetric history on file.      Home Medications    Prior to Admission medications   Medication Sig Start Date End Date Taking? Authorizing Provider  Azelaic Acid (FINACEA) 15 % FOAM Apply 1 application topically daily. 06/24/20   Lavonna Monarch, MD  Cholecalciferol (VITAMIN D3 PO) Take by mouth.    [provider]  Cyanocobalamin (VITAMIN B-12 PO) Take by mouth.    [provider]  ibuprofen (ADVIL) 800 MG tablet Take 1 tablet (800 mg total) by mouth 3 (three) times daily. 01/04/20   Wurst, [place], PA-C  nitrofurantoin, macrocrystal-monohydrate, (MACROBID) 100 MG capsule Take 1 capsule (100 mg total) by mouth 2 (two) times daily. 05/27/19   Wurst, [place], PA-C  Norethin Ace-Eth Estrad-FE (MINASTRIN 24 FE) 1-20 MG-MCG(24) CHEW Chew 1 tablet by mouth daily. Dispense brand name please 01/12/18   Baruch Gouty, FNP  Tazarotene (ARAZLO) 0.045 % LOTN Apply 1 application topically daily. 06/24/20   Lavonna Monarch, MD     Family History Family History  Problem Relation Age of Onset  . Hypertension Mother   . Hyperlipidemia Mother   . Diabetes Maternal Uncle   . Hypertension Paternal Grandfather   . Hyperlipidemia Paternal Grandfather     Social History Social History   Tobacco Use  . Smoking status: Never Smoker  . Smokeless tobacco: Never Used  Vaping Use  . Vaping Use: Never used  Substance Use Topics  . Alcohol use: Not Currently  . Drug use: Never     Allergies   Oyster shell   Review of Systems Review of Systems  Pertinent negatives listed in HPI  Physical Exam Triage Vital Signs ED Triage Vitals  Enc Vitals Group     BP 08/10/20 1430 135/85     Pulse Rate 08/10/20 1430 76     Resp 08/10/20 1430 16     Temp 08/10/20 1430 98.5 F (36.9 C)     Temp Source 08/10/20 1430 Oral     SpO2 08/10/20 1430 99 %     Weight --      Height --      Head Circumference --      Peak Flow --      Pain Score 08/10/20 1428 10     Pain Loc --      Pain Edu? --      Excl. in [place]? --  No data found.  Updated Vital Signs BP 135/85 (BP Location: Right Arm)   Pulse 76   Temp 98.5 F (36.9 C) (Oral)   Resp 16   LMP 07/22/2020   SpO2 99%   Visual Acuity Right Eye Distance:   Left Eye Distance:   Bilateral Distance:    Right Eye Near:   Left Eye Near:    Bilateral Near:     Physical Exam General appearance: Alert, well developed, well nourished, cooperative and in no distress Head: Normocephalic, without obvious abnormality, atraumatic Respiratory: Respirations even and unlabored Heart: rate and rhythm normal.  Extremities: No obvious thoracic or lumbar deformities  Skin: Skin color, texture, turgor normal. No rashes seen  Psych: Appropriate mood and affect. Neurologic: GCS 15, normal coordination normal gait   UC Treatments / Results  Labs (all labs ordered are listed, but only abnormal results are displayed) Labs Reviewed  POCT URINALYSIS DIP (MANUAL ENTRY) -  Abnormal; Notable for the following components:      Result Value   Ketones, POC UA small (15) (*)    Blood, UA trace-intact (*)    All other components within normal limits  URINE CULTURE    EKG   Radiology No results found.  Procedures Procedures (including critical care time)  Medications Ordered in UC Medications - No data to display  Initial Impression / Assessment and Plan / UC Course  I have reviewed the triage vital signs and the nursing notes.  Pertinent labs & imaging results that were available during my care of the patient were reviewed by me and considered in my medical decision making (see chart for details).    Acute on chronic bilateral low back pain.  Continue Naprosyn recommend over-the-counter lidocaine patches as needed for pain.  And heating pad.  Patient given a work note to return back tomorrow.  Patient declined Toradol and prednisone injection. Final Clinical Impressions(s) / UC Diagnoses   Final diagnoses:  Acute bilateral low back pain with bilateral sciatica     Discharge Instructions     Heating pad and lidocaine patches.    ED Prescriptions    Medication Sig Dispense Auth. Provider   naproxen (NAPROSYN) 375 MG tablet Take 1 tablet (375 mg total) by mouth 2 (two) times daily. 20 tablet Scot Jun, FNP     PDMP not reviewed this encounter.   Scot Jun, FNP 08/10/20 1527

## 2020-08-13 LAB — URINE CULTURE: Culture: >=100000 — AB

## 2020-08-18 ENCOUNTER — Other Ambulatory Visit: Payer: Self-pay | Admitting: Family Medicine

## 2020-09-01 ENCOUNTER — Ambulatory Visit: Payer: BLUE CROSS/BLUE SHIELD | Admitting: Dermatology

## 2020-09-12 ENCOUNTER — Other Ambulatory Visit: Payer: Self-pay

## 2020-09-12 ENCOUNTER — Ambulatory Visit
Admission: RE | Admit: 2020-09-12 | Discharge: 2020-09-12 | Disposition: A | Discharge status: Home / Self Care | Payer: BLUE CROSS/BLUE SHIELD | Source: Ambulatory Visit | Attending: Emergency Medicine | Admitting: Emergency Medicine

## 2020-09-12 VITALS — BP 115/77 | HR 79 | Temp 98.7°F | Resp 20

## 2020-09-12 DIAGNOSIS — N898 Other specified noninflammatory disorders of vagina: Secondary | ICD-10-CM

## 2020-09-12 DIAGNOSIS — B379 Candidiasis, unspecified: Secondary | ICD-10-CM

## 2020-09-12 MED ORDER — FLUCONAZOLE 200 MG PO TABS: ORAL_TABLET | ORAL | 0 refills | Status: DC

## 2020-09-12 NOTE — Discharge Instructions (Addendum)
Based on symptoms will cover for yeast infection Prescribed diflucan 200 mg once daily and then second dose 72 hours later Take medications as prescribed and to completion Follow up with PCP Return here or go to ER if you have any new or worsening symptoms fever, chills, nausea, vomiting, abdominal or pelvic pain, painful intercourse, vaginal discharge, vaginal bleeding, persistent symptoms despite treatment, etc..Marland Kitchen

## 2020-09-12 NOTE — ED Provider Notes (Signed)
[facility]   [id number] 09/12/20 Arrival Time: 4010   [id number] DISCHARGE  SUBJECTIVE:  Laticha Ferrucci is a 25 y.o. female who presents with complaints of vaginal itching, irritation, and thick white vaginal discharge x 3 days.  Admits to recent shaving and sexual activity.  Denies concern for STD.  Has not tried OTC medications.  She denies aggravating factors.  Reports previous hx of yeast infection.  She denies fever, chills, nausea, vomiting, abdominal or pelvic pain, urinary symptoms, vaginal odor, vaginal bleeding, dyspareunia, vaginal rashes or lesions.    ROS: As per HPI.  All other pertinent ROS negative.     Past Medical History:  Diagnosis Date   Anxiety    Panic attacks    Past Surgical History:  Procedure Laterality Date   ADENOIDECTOMY     TONSILLECTOMY     Allergies  Allergen Reactions   Oyster Shell     Positive skin allergy test 02/06/20.    No current facility-administered medications on file prior to encounter.   Current Outpatient Medications on File Prior to Encounter  Medication Sig Dispense Refill   Azelaic Acid (FINACEA) 15 % FOAM Apply 1 application topically daily. 50 g 2   Cholecalciferol (VITAMIN D3 PO) Take by mouth.     Cyanocobalamin (VITAMIN B-12 PO) Take by mouth.     naproxen (NAPROSYN) 375 MG tablet Take 1 tablet (375 mg total) by mouth 2 (two) times daily. 20 tablet 0   nitrofurantoin, macrocrystal-monohydrate, (MACROBID) 100 MG capsule Take 1 capsule (100 mg total) by mouth 2 (two) times daily. 10 capsule 0   Norethin Ace-Eth Estrad-FE (MINASTRIN 24 FE) 1-20 MG-MCG(24) CHEW Chew 1 tablet by mouth daily. Dispense brand name please 28 tablet 12   Tazarotene (ARAZLO) 0.045 % LOTN Apply 1 application topically daily. 45 g 2    Social History   Socioeconomic History   Marital status: Single    Spouse name: Not on file   Number of children: Not on file   Years of education: Not on file   Highest education level: Not on file   Occupational History   Not on file  Tobacco Use   Smoking status: Never   Smokeless tobacco: Never  Vaping Use   Vaping Use: Never used  Substance and Sexual Activity   Alcohol use: Not Currently   Drug use: Never   Sexual activity: Not on file  Other Topics Concern   Not on file  Social History Narrative   Not on file   Social Determinants of Health   Financial Resource Strain: Not on file  Food Insecurity: Not on file  Transportation Needs: Not on file  Physical Activity: Not on file  Stress: Not on file  Social Connections: Not on file  Intimate Partner Violence: Not on file   Family History  Problem Relation Age of Onset   Hypertension Mother    Hyperlipidemia Mother    Diabetes Maternal Uncle    Hypertension Paternal Grandfather    Hyperlipidemia Paternal Grandfather     OBJECTIVE:  Vitals:   09/12/20 1249  BP: 115/77  Pulse: 79  Resp: 20  Temp: 98.7 F (37.1 C)  SpO2: 97%     General appearance: Alert, NAD, appears stated age Head: NCAT Throat: lips, mucosa, and tongue normal; teeth and gums normal Lungs: CTA bilaterally without adventitious breath sounds Heart: regular rate and rhythm.   Abdomen: soft, non-tender; bowel sounds normal; no guarding GU: deferred Skin: warm and dry Psychological:  Alert  and cooperative. Normal mood and affect.  ASSESSMENT & PLAN:  1. Vaginal discharge   2. Yeast infection     Meds ordered this encounter  Medications   fluconazole (DIFLUCAN) 200 MG tablet    Sig: Take one dose by mouth, wait 72 hours, and then take second dose by mouth    Dispense:  2 tablet    Refill:  0    Order Specific Question:   Supervising Provider    Answer:   Raylene Everts [[id number]]    Pending: Labs Reviewed - No data to display  Based on symptoms will cover for yeast infection Prescribed diflucan 200 mg once daily and then second dose 72 hours later Take medications as prescribed and to completion Follow up with  PCP Return here or go to ER if you have any new or worsening symptoms fever, chills, nausea, vomiting, abdominal or pelvic pain, painful intercourse, vaginal discharge, vaginal bleeding, persistent symptoms despite treatment, etc...  Reviewed expectations re: course of current medical issues. Questions answered. Outlined signs and symptoms indicating need for more acute intervention. Patient verbalized understanding. After Visit Summary given.        Lestine Box, PA-C 09/12/20 1326

## 2020-09-12 NOTE — ED Triage Notes (Signed)
Pt presents with c/o vaginal itching and burning with white discharge, pt not concerned with std

## 2020-09-17 ENCOUNTER — Ambulatory Visit (INDEPENDENT_AMBULATORY_CARE_PROVIDER_SITE_OTHER): Payer: BLUE CROSS/BLUE SHIELD | Admitting: Family Medicine

## 2020-09-17 ENCOUNTER — Other Ambulatory Visit: Payer: Self-pay

## 2020-09-17 ENCOUNTER — Encounter: Payer: Self-pay | Admitting: Family Medicine

## 2020-09-17 VITALS — BP 120/81 | HR 75 | Temp 98.3°F | Resp 20 | Ht 60.0 in | Wt 150.0 lb

## 2020-09-17 DIAGNOSIS — Z0001 Encounter for general adult medical examination with abnormal findings: Secondary | ICD-10-CM | POA: Diagnosis not present

## 2020-09-17 DIAGNOSIS — G8929 Other chronic pain: Secondary | ICD-10-CM

## 2020-09-17 DIAGNOSIS — M546 Pain in thoracic spine: Secondary | ICD-10-CM | POA: Diagnosis not present

## 2020-09-17 DIAGNOSIS — Z0000 Encounter for general adult medical examination without abnormal findings: Secondary | ICD-10-CM | POA: Diagnosis not present

## 2020-09-17 DIAGNOSIS — K649 Unspecified hemorrhoids: Secondary | ICD-10-CM

## 2020-09-17 DIAGNOSIS — F320 Major depressive disorder, single episode, mild: Secondary | ICD-10-CM | POA: Diagnosis not present

## 2020-09-17 DIAGNOSIS — F411 Generalized anxiety disorder: Secondary | ICD-10-CM | POA: Diagnosis not present

## 2020-09-17 DIAGNOSIS — Z021 Encounter for pre-employment examination: Secondary | ICD-10-CM

## 2020-09-17 MED ORDER — NAPROXEN 375 MG PO TABS: 375.0000 mg | ORAL_TABLET | Freq: Two times a day (BID) | ORAL | 5 refills | Status: DC

## 2020-09-17 MED ORDER — HYDROCORT-PRAMOXINE (PERIANAL) 1-1 % EX FOAM: 1.0000 | Freq: Two times a day (BID) | CUTANEOUS | 1 refills | Status: DC | PRN

## 2020-09-17 NOTE — Progress Notes (Signed)
Katrina Lin is a 25 y.o. female presents to office today for physical for work. She has a form for this. She is unsure of her MMR and Hep B vaccination status.   She used to do counseling. This was really helpful for her anxiety symptoms. She is interested in counseling again. She is not interested in medication at this time.  She would like a refill on naproxen for her chronic bilateral thoracic back pain. Reports the pain is intermittent. Naproxen is helpful. She has also completed PT.   She would also like a prescription for hemorrhoids. She reports intermittent external hemorrhoids since delivering her child a few years ago. She has tried preparation H and witch hazel. She denies bleeding.   GAD 7 : Generalized Anxiety Score 09/17/2020  Nervous, Anxious, on Edge 3  Control/stop worrying 3  Worry too much - different things 3  Trouble relaxing 3  Restless 3  Easily annoyed or irritable 3  Afraid - awful might happen 3  Total GAD 7 Score 21  Anxiety Difficulty Very difficult   Depression screen [facility] 2/9 09/17/2020 01/16/2020 12/12/2019 05/16/2019 03/28/2019  Decreased Interest 0 0 0 0 0  Down, Depressed, Hopeless 1 0 0 0 0  PHQ - 2 Score 1 0 0 0 0  Altered sleeping 2 - - - -  Tired, decreased energy 2 - - - -  Change in appetite 2 - - - -  Feeling bad or failure about yourself  0 - - - -  Trouble concentrating 0 - - - -  Moving slowly or fidgety/restless 0 - - - -  Suicidal thoughts 0 - - - -  PHQ-9 Score 7 - - - -  Difficult doing work/chores Somewhat difficult - - - -     Past Medical History:  Diagnosis Date   Anxiety    Panic attacks    Social History   Socioeconomic History   Marital status: Single    Spouse name: Not on file   Number of children: Not on file   Years of education: Not on file   Highest education level: Not on file  Occupational History   Not on file  Tobacco Use   Smoking status: Never   Smokeless tobacco: Never  Vaping Use   Vaping  Use: Never used  Substance and Sexual Activity   Alcohol use: Not Currently   Drug use: Never   Sexual activity: Not on file  Other Topics Concern   Not on file  Social History Narrative   Not on file   Social Determinants of Health   Financial Resource Strain: Not on file  Food Insecurity: Not on file  Transportation Needs: Not on file  Physical Activity: Not on file  Stress: Not on file  Social Connections: Not on file  Intimate Partner Violence: Not on file   Past Surgical History:  Procedure Laterality Date   ADENOIDECTOMY     TONSILLECTOMY     Family History  Problem Relation Age of Onset   Hypertension Mother    Hyperlipidemia Mother    Diabetes Maternal Uncle    Hypertension Paternal Grandfather    Hyperlipidemia Paternal Grandfather     Current Outpatient Medications:    Cholecalciferol (VITAMIN D3 PO), Take by mouth., Disp: , Rfl:    Cyanocobalamin (VITAMIN B-12 PO), Take by mouth., Disp: , Rfl:    Norethin Ace-Eth Estrad-FE (MINASTRIN 24 FE) 1-20 MG-MCG(24) CHEW, Chew 1 tablet by mouth daily. Dispense brand name  please, Disp: 28 tablet, Rfl: 12  Allergies  Allergen Reactions   Oyster Shell     Positive skin allergy test 02/06/20.      ROS: Review of Systems Pertinent items noted in HPI and remainder of comprehensive ROS otherwise negative.    Physical exam BP 120/81   Pulse 75   Temp 98.3 F (36.8 C) (Temporal)   Resp 20   Ht 5' (1.524 m)   Wt 150 lb (68 kg)   SpO2 99%   BMI 29.29 kg/m  General appearance: alert, cooperative, and no distress Head: Normocephalic, without obvious abnormality, atraumatic Eyes: conjunctivae/corneas clear. PERRL, EOM's intact. Fundi benign. Ears: normal TM's and external ear canals both ears Nose: Nares normal. Septum midline. Mucosa normal. No drainage or sinus tenderness. Throat: lips, mucosa, and tongue normal; teeth and gums normal Neck: no adenopathy, no carotid bruit, no JVD, supple, symmetrical, trachea  midline, and thyroid not enlarged, symmetric, no tenderness/mass/nodules Lungs: clear to auscultation bilaterally Heart: regular rate and rhythm, S1, S2 normal, no murmur, click, rub or gallop Abdomen: soft, non-tender; bowel sounds normal; no masses,  no organomegaly Extremities: extremities normal, atraumatic, no cyanosis or edema Pulses: 2+ and symmetric Skin: Skin color, texture, turgor normal. No rashes or lesions Neurologic: Alert and oriented X 3, normal strength and tone. Normal symmetric reflexes. Normal coordination and gait    Assessment/ Plan: Katrina Lin here for annual physical exam.   Diagnoses and all orders for this visit:  Routine general medical examination at a health care facility Labs pending as below.  -     CBC with Differential/Platelet -     CMP14+EGFR -     Lipid panel -     TSH  Encounter for pre-employment examination Will complete form for patient when labs result.  -     Measles/Mumps/Rubella Immunity -     QuantiFERON-TB Gold Plus -     Hepatitis B surface antibody,quantitative  Generalized anxiety disorder Uncontrolled. Declined medication today. Referral to psychology -     Ambulatory referral to Psychology  Depression, major, single episode, mild (HCC) PHQ score of 7 today. Denies SI. Declined medication. Referral placed.  -     Ambulatory referral to Psychology  Chronic bilateral thoracic back pain Refill provided.  -     naproxen (NAPROSYN) 375 MG tablet; Take 1 tablet (375 mg total) by mouth 2 (two) times daily.  Hemorrhoids, unspecified hemorrhoid type Try below.  -     hydrocortisone-pramoxine (PROCTOFOAM-HC) rectal foam; Place 1 applicator rectally 2 (two) times daily as needed for hemorrhoids or anal itching.  Counseled on healthy lifestyle choices, including diet (rich in fruits, vegetables and lean meats and low in salt and simple carbohydrates) and exercise (at least 30 minutes of moderate physical activity  daily).  Patient to follow up in 1 year for annual exam or sooner if needed.  The above assessment and management plan was discussed with the patient. The patient verbalized understanding of and has agreed to the management plan. Patient is aware to call the clinic if symptoms persist or worsen. Patient is aware when to return to the clinic for a follow-up visit. Patient educated on when it is appropriate to go to the emergency department.   Katrina Smolder, FNP-C [facility] [address] [place], [place] [address] [phone]

## 2020-09-17 NOTE — Patient Instructions (Signed)
Health Maintenance, Female Adopting a healthy lifestyle and getting preventive care are important in promoting health and wellness. Ask your health care provider about: The right schedule for you to have regular tests and exams. Things you can do on your own to prevent diseases and keep yourself healthy. What should I know about diet, weight, and exercise? Eat a healthy diet  Eat a diet that includes plenty of vegetables, fruits, low-fat dairy products, and lean protein. Do not eat a lot of foods that are high in solid fats, added sugars, or sodium.  Maintain a healthy weight Body mass index (BMI) is used to identify weight problems. It estimates body fat based on height and weight. Your health care provider can help determineyour BMI and help you achieve or maintain a healthy weight. Get regular exercise Get regular exercise. This is one of the most important things you can do for your health. Most adults should: Exercise for at least 150 minutes each week. The exercise should increase your heart rate and make you sweat (moderate-intensity exercise). Do strengthening exercises at least twice a week. This is in addition to the moderate-intensity exercise. Spend less time sitting. Even light physical activity can be beneficial. Watch cholesterol and blood lipids Have your blood tested for lipids and cholesterol at 26 years of age, then havethis test every 5 years. Have your cholesterol levels checked more often if: Your lipid or cholesterol levels are high. You are older than 26 years of age. You are at high risk for heart disease. What should I know about cancer screening? Depending on your health history and family history, you may need to have cancer screening at various ages. This may include screening for: Breast cancer. Cervical cancer. Colorectal cancer. Skin cancer. Lung cancer. What should I know about heart disease, diabetes, and high blood pressure? Blood pressure and heart  disease High blood pressure causes heart disease and increases the risk of stroke. This is more likely to develop in people who have high blood pressure readings, are of African descent, or are overweight. Have your blood pressure checked: Every 3-5 years if you are 18-39 years of age. Every year if you are 40 years old or older. Diabetes Have regular diabetes screenings. This checks your fasting blood sugar level. Have the screening done: Once every three years after age 40 if you are at a normal weight and have a low risk for diabetes. More often and at a younger age if you are overweight or have a high risk for diabetes. What should I know about preventing infection? Hepatitis B If you have a higher risk for hepatitis B, you should be screened for this virus. Talk with your health care provider to find out if you are at risk forhepatitis B infection. Hepatitis C Testing is recommended for: Everyone born from 1945 through 1965. Anyone with known risk factors for hepatitis C. Sexually transmitted infections (STIs) Get screened for STIs, including gonorrhea and chlamydia, if: You are sexually active and are younger than 26 years of age. You are older than 26 years of age and your health care provider tells you that you are at risk for this type of infection. Your sexual activity has changed since you were last screened, and you are at increased risk for chlamydia or gonorrhea. Ask your health care provider if you are at risk. Ask your health care provider about whether you are at high risk for HIV. Your health care provider may recommend a prescription medicine to help   prevent HIV infection. If you choose to take medicine to prevent HIV, you should first get tested for HIV. You should then be tested every 3 months for as long as you are taking the medicine. Pregnancy If you are about to stop having your period (premenopausal) and you may become pregnant, seek counseling before you get  pregnant. Take 400 to 800 micrograms (mcg) of folic acid every day if you become pregnant. Ask for birth control (contraception) if you want to prevent pregnancy. Osteoporosis and menopause Osteoporosis is a disease in which the bones lose minerals and strength with aging. This can result in bone fractures. If you are 65 years old or older, or if you are at risk for osteoporosis and fractures, ask your health care provider if you should: Be screened for bone loss. Take a calcium or vitamin D supplement to lower your risk of fractures. Be given hormone replacement therapy (HRT) to treat symptoms of menopause. Follow these instructions at home: Lifestyle Do not use any products that contain nicotine or tobacco, such as cigarettes, e-cigarettes, and chewing tobacco. If you need help quitting, ask your health care provider. Do not use street drugs. Do not share needles. Ask your health care provider for help if you need support or information about quitting drugs. Alcohol use Do not drink alcohol if: Your health care provider tells you not to drink. You are pregnant, may be pregnant, or are planning to become pregnant. If you drink alcohol: Limit how much you use to 0-1 drink a day. Limit intake if you are breastfeeding. Be aware of how much alcohol is in your drink. In the [place] one drink equals one 12 oz bottle of beer (355 mL), one 5 oz glass of wine (148 mL), or one 1 oz glass of hard liquor (44 mL). General instructions Schedule regular health, dental, and eye exams. Stay current with your vaccines. Tell your health care provider if: You often feel depressed. You have ever been abused or do not feel safe at home. Summary Adopting a healthy lifestyle and getting preventive care are important in promoting health and wellness. Follow your health care provider's instructions about healthy diet, exercising, and getting tested or screened for diseases. Follow your health care provider's  instructions on monitoring your cholesterol and blood pressure. This information is not intended to replace advice given to you by your health care provider. Make sure you discuss any questions you have with your healthcare provider. Document Revised: 02/15/2018 Document Reviewed: 02/15/2018 Elsevier Patient Education  2022 Elsevier Inc.  

## 2020-09-22 LAB — CMP14+EGFR
ALT: 12 IU/L (ref 0–32)
AST: 12 IU/L (ref 0–40)
Albumin/Globulin Ratio: 1.4 (ref 1.2–2.2)
Albumin: 4.6 g/dL (ref 3.9–5.0)
Alkaline Phosphatase: 57 IU/L (ref 44–121)
BUN/Creatinine Ratio: 11 (ref 9–23)
BUN: 8 mg/dL (ref 6–20)
Bilirubin Total: 0.2 mg/dL (ref 0.0–1.2)
CO2: 21 mmol/L (ref 20–29)
Calcium: 9.7 mg/dL (ref 8.7–10.2)
Chloride: 103 mmol/L (ref 96–106)
Creatinine, Ser: 0.75 mg/dL (ref 0.57–1.00)
Globulin, Total: 3.2 g/dL (ref 1.5–4.5)
Glucose: 96 mg/dL (ref 65–99)
Potassium: 4 mmol/L (ref 3.5–5.2)
Sodium: 139 mmol/L (ref 134–144)
Total Protein: 7.8 g/dL (ref 6.0–8.5)
eGFR: 113 mL/min/{1.73_m2} (ref >59)

## 2020-09-22 LAB — QUANTIFERON-TB GOLD PLUS
QuantiFERON Mitogen Value: >10 IU/mL
QuantiFERON Nil Value: 0 IU/mL
QuantiFERON TB1 Ag Value: 0.01 IU/mL
QuantiFERON TB2 Ag Value: 0.03 IU/mL
QuantiFERON-TB Gold Plus: NEGATIVE

## 2020-09-22 LAB — CBC WITH DIFFERENTIAL/PLATELET
Basophils Absolute: 0 10*3/uL (ref 0.0–0.2)
Basos: 0 %
EOS (ABSOLUTE): 0.1 10*3/uL (ref 0.0–0.4)
Eos: 1 %
Hematocrit: 40.8 % (ref 34.0–46.6)
Hemoglobin: 13.5 g/dL (ref 11.1–15.9)
Immature Grans (Abs): 0 10*3/uL (ref 0.0–0.1)
Immature Granulocytes: 0 %
Lymphocytes Absolute: 2.6 10*3/uL (ref 0.7–3.1)
Lymphs: 30 %
MCH: 29.9 pg (ref 26.6–33.0)
MCHC: 33.1 g/dL (ref 31.5–35.7)
MCV: 90 fL (ref 79–97)
Monocytes Absolute: 0.7 10*3/uL (ref 0.1–0.9)
Monocytes: 8 %
Neutrophils Absolute: 5.2 10*3/uL (ref 1.4–7.0)
Neutrophils: 61 %
Platelets: 344 10*3/uL (ref 150–450)
RBC: 4.52 x10E6/uL (ref 3.77–5.28)
RDW: 12.6 % (ref 11.7–15.4)
WBC: 8.6 10*3/uL (ref 3.4–10.8)

## 2020-09-22 LAB — TSH: TSH: 1.2 u[IU]/mL (ref 0.450–4.500)

## 2020-09-22 LAB — LIPID PANEL
Chol/HDL Ratio: 4 ratio (ref 0.0–4.4)
Cholesterol, Total: 235 mg/dL — ABNORMAL HIGH (ref 100–199)
HDL: 59 mg/dL (ref >39)
LDL Chol Calc (NIH): 145 mg/dL — ABNORMAL HIGH (ref 0–99)
Triglycerides: 175 mg/dL — ABNORMAL HIGH (ref 0–149)
VLDL Cholesterol Cal: 31 mg/dL (ref 5–40)

## 2020-09-22 LAB — MEASLES/MUMPS/RUBELLA IMMUNITY
MUMPS ABS, IGG: <9 AU/mL — ABNORMAL LOW (ref >10.9)
RUBEOLA AB, IGG: 55.7 AU/mL (ref >16.4)
Rubella Antibodies, IGG: 1.88 index (ref >0.99)

## 2020-09-22 LAB — HEPATITIS B SURFACE ANTIBODY, QUANTITATIVE: Hepatitis B Surf Ab Quant: <3.1 m[IU]/mL — ABNORMAL LOW (ref >9.9)

## 2020-10-03 DIAGNOSIS — Z124 Encounter for screening for malignant neoplasm of cervix: Secondary | ICD-10-CM | POA: Diagnosis not present

## 2020-10-03 DIAGNOSIS — N925 Other specified irregular menstruation: Secondary | ICD-10-CM | POA: Diagnosis not present

## 2020-10-03 DIAGNOSIS — Z113 Encounter for screening for infections with a predominantly sexual mode of transmission: Secondary | ICD-10-CM | POA: Diagnosis not present

## 2020-10-03 DIAGNOSIS — Z01419 Encounter for gynecological examination (general) (routine) without abnormal findings: Secondary | ICD-10-CM | POA: Diagnosis not present

## 2021-01-04 ENCOUNTER — Ambulatory Visit: Payer: BLUE CROSS/BLUE SHIELD

## 2021-01-08 ENCOUNTER — Ambulatory Visit
Admission: EM | Admit: 2021-01-08 | Discharge: 2021-01-08 | Disposition: A | Discharge status: Home / Self Care | Payer: BLUE CROSS/BLUE SHIELD | Attending: Urgent Care | Admitting: Urgent Care

## 2021-01-08 ENCOUNTER — Other Ambulatory Visit: Payer: Self-pay

## 2021-01-08 DIAGNOSIS — Z20828 Contact with and (suspected) exposure to other viral communicable diseases: Secondary | ICD-10-CM | POA: Diagnosis not present

## 2021-01-08 DIAGNOSIS — B9789 Other viral agents as the cause of diseases classified elsewhere: Secondary | ICD-10-CM

## 2021-01-08 DIAGNOSIS — J988 Other specified respiratory disorders: Secondary | ICD-10-CM | POA: Diagnosis not present

## 2021-01-08 DIAGNOSIS — R0981 Nasal congestion: Secondary | ICD-10-CM | POA: Insufficient documentation

## 2021-01-08 DIAGNOSIS — R350 Frequency of micturition: Secondary | ICD-10-CM | POA: Diagnosis not present

## 2021-01-08 LAB — POCT URINALYSIS DIP (MANUAL ENTRY)
Bilirubin, UA: NEGATIVE
Blood, UA: NEGATIVE
Glucose, UA: NEGATIVE mg/dL
Ketones, POC UA: NEGATIVE mg/dL
Leukocytes, UA: NEGATIVE
Nitrite, UA: NEGATIVE
Protein Ur, POC: NEGATIVE mg/dL
Spec Grav, UA: 1.025 (ref 1.010–1.025)
Urobilinogen, UA: 1 E.U./dL
pH, UA: 6.5 (ref 5.0–8.0)

## 2021-01-08 MED ORDER — PSEUDOEPHEDRINE HCL 30 MG PO TABS: 30.0000 mg | ORAL_TABLET | Freq: Three times a day (TID) | ORAL | 0 refills | Status: DC | PRN

## 2021-01-08 MED ORDER — CETIRIZINE HCL 10 MG PO TABS: 10.0000 mg | ORAL_TABLET | Freq: Every day | ORAL | 0 refills | Status: DC

## 2021-01-08 NOTE — ED Triage Notes (Signed)
Pt reports that she's on her last day of marcobid for uti sxs. Pt continues to have sxs and would like another UA.  Confirms urinary frequency and low back pain.  Denies hematuria, dysuria and urinary urgency.  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 Notes 2 days of congestion and HA. Negative at home covid test. No cough.

## 2021-01-08 NOTE — Discharge Instructions (Addendum)
We will notify you of your test results as they arrive and may take between 48-72 hours.  I encourage you to sign up for MyChart if you have not already done so as this can be the easiest way for [place] to communicate results to you online or through a phone app.  Generally, we only contact you if it is a positive test result.  In the meantime, if you develop worsening symptoms including fever, chest pain, shortness of breath despite our current treatment plan then please report to the emergency room as this may be a sign of worsening status from possible viral infection.  Otherwise, we will manage this as a viral syndrome. For sore throat or cough try using a honey-based tea. Use 3 teaspoons of honey with juice squeezed from half lemon. Place shaved pieces of ginger into 1/2-1 cup of water and warm over stove top. Then mix the ingredients and repeat every 4 hours as needed. Please take Tylenol 500mg -650mg  every 6 hours for aches and pains, fevers. Hydrate very well with at least 2 liters of water. Eat light meals such as soups to replenish electrolytes and soft fruits, veggies. Start an antihistamine like Zyrtec for postnasal drainage, sinus congestion.  You can take this together with pseudoephedrine (Sudafed) at a dose of 30 mg 2-3 times a day as needed for the same kind of congestion.    Make sure you hydrate very well with plain water and a quantity of 64 ounces of water a day.  Please limit drinks that are considered urinary irritants such as soda, sweet tea, coffee, energy drinks, alcohol.  These can worsen your urinary and genital symptoms but also be the source of them.  I will let you know about your urine culture results through MyChart to see if we need to prescribe or change your antibiotics based off of those results.

## 2021-01-08 NOTE — ED Provider Notes (Signed)
[facility]   MRN: [id number] DOB: 10/01/1995  Subjective:   Katrina Lin is a 25 y.o. female presenting for 2-day history of acute onset sinus congestion, sinus headaches.  No chest pain, shortness of breath or wheezing.  Has had multiple exposures at work with influenza.  She would also like to be checked for UTI as she has had some urinary frequency, low back pain.  Denies fever, dysuria, hematuria, vaginal discharge.  She is about to finish her course of Macrobid for a urinary tract infection.  Does not hydrate with water as well as she should.  Drinks juice every day.  No current facility-administered medications for this encounter.  Current Outpatient Medications:    Cholecalciferol (VITAMIN D3 PO), Take by mouth., Disp: , Rfl:    Cyanocobalamin (VITAMIN B-12 PO), Take by mouth., Disp: , Rfl:    hydrocortisone-pramoxine (PROCTOFOAM-HC) rectal foam, Place 1 applicator rectally 2 (two) times daily as needed for hemorrhoids or anal itching., Disp: 10 g, Rfl: 1   naproxen (NAPROSYN) 375 MG tablet, Take 1 tablet (375 mg total) by mouth 2 (two) times daily., Disp: 20 tablet, Rfl: 5   Norethin Ace-Eth Estrad-FE (MINASTRIN 24 FE) 1-20 MG-MCG(24) CHEW, Chew 1 tablet by mouth daily. Dispense brand name please, Disp: 28 tablet, Rfl: 12   Allergies  Allergen Reactions   Oyster Shell     Positive skin allergy test 02/06/20.     Past Medical History:  Diagnosis Date   Anxiety    Panic attacks      Past Surgical History:  Procedure Laterality Date   ADENOIDECTOMY     TONSILLECTOMY      Family History  Problem Relation Age of Onset   Hypertension Mother    Hyperlipidemia Mother    Diabetes Maternal Uncle    Hypertension Paternal Grandfather    Hyperlipidemia Paternal Grandfather     Social History   Tobacco Use   Smoking status: Never   Smokeless tobacco: Never  Vaping Use   Vaping Use: Never used  Substance Use Topics   Alcohol use: Not Currently    Drug use: Never    ROS   Objective:   Vitals: BP (!) 143/94 (BP Location: Right Arm)   Pulse 93   Temp 98.8 F (37.1 C) (Oral)   Resp 18   LMP 10/28/2020 (Exact Date)   SpO2 98%   Physical Exam Constitutional:      General: She is not in acute distress.    Appearance: Normal appearance. She is well-developed. She is not ill-appearing, toxic-appearing or diaphoretic.  HENT:     Head: Normocephalic and atraumatic.     Right Ear: External ear normal.     Left Ear: External ear normal.     Nose: Congestion present. No rhinorrhea.     Comments: Post-nasal drainage overlying pharynx.     Mouth/Throat:     Mouth: Mucous membranes are moist.     Pharynx: No oropharyngeal exudate or posterior oropharyngeal erythema.  Eyes:     General: No scleral icterus.       Right eye: No discharge.        Left eye: No discharge.     Extraocular Movements: Extraocular movements intact.     Conjunctiva/sclera: Conjunctivae normal.     Pupils: Pupils are equal, round, and reactive to light.  Cardiovascular:     Rate and Rhythm: Normal rate and regular rhythm.     Pulses: Normal pulses.     Heart sounds: Normal  heart sounds. No murmur heard.   No friction rub. No gallop.  Pulmonary:     Effort: Pulmonary effort is normal. No respiratory distress.     Breath sounds: Normal breath sounds. No stridor. No wheezing, rhonchi or rales.  Abdominal:     General: Bowel sounds are normal. There is no distension.     Palpations: Abdomen is soft. There is no mass.     Tenderness: There is no abdominal tenderness. There is no right CVA tenderness, left CVA tenderness, guarding or rebound.  Skin:    General: Skin is warm and dry.     Findings: No rash.  Neurological:     General: No focal deficit present.     Mental Status: She is alert and oriented to person, place, and time.  Psychiatric:        Mood and Affect: Mood normal.        Behavior: Behavior normal.        Thought Content: Thought content  normal.        Judgment: Judgment normal.    Results for orders placed or performed during the hospital encounter of 01/08/21 (from the past 24 hour(s))  POCT urinalysis dipstick     Status: None   Collection Time: 01/08/21 10:07 AM  Result Value Ref Range   Color, UA yellow yellow   Clarity, UA clear clear   Glucose, UA negative negative mg/dL   Bilirubin, UA negative negative   Ketones, POC UA negative negative mg/dL   Spec Grav, UA 1.025 1.010 - 1.025   Blood, UA negative negative   pH, UA 6.5 5.0 - 8.0   Protein Ur, POC negative negative mg/dL   Urobilinogen, UA 1.0 0.2 or 1.0 E.U./dL   Nitrite, UA Negative Negative   Leukocytes, UA Negative Negative    Assessment and Plan :   PDMP not reviewed this encounter.  1. Viral respiratory illness   2. Exposure to influenza   3. Urinary frequency   4. Nasal congestion    COVID and flu test pending.  We will otherwise manage for viral upper respiratory infection.  Physical exam findings reassuring and vital signs stable for discharge. Advised supportive care, offered symptomatic relief. Limit urinary irritants, hydrate better with plain water. Urine culture is pending. Will hold off on more antibiotics for now. Counseled patient on potential for adverse effects with medications prescribed/recommended today, ER and return-to-clinic precautions discussed, patient verbalized understanding.      Jaynee Eagles, PA-C 01/08/21 1026

## 2021-01-09 LAB — COVID-19, FLU A+B NAA
Influenza A, NAA: NOT DETECTED
Influenza B, NAA: NOT DETECTED
SARS-CoV-2, NAA: NOT DETECTED

## 2021-01-09 LAB — URINE CULTURE

## 2021-05-06 ENCOUNTER — Encounter: Payer: Self-pay | Admitting: Family Medicine

## 2021-05-06 ENCOUNTER — Ambulatory Visit: Payer: BC Managed Care – PPO | Admitting: Family Medicine

## 2021-05-06 VITALS — BP 106/59 | HR 83 | Temp 98.1°F | Ht 60.0 in | Wt 151.2 lb

## 2021-05-06 DIAGNOSIS — F339 Major depressive disorder, recurrent, unspecified: Secondary | ICD-10-CM | POA: Diagnosis not present

## 2021-05-06 DIAGNOSIS — F419 Anxiety disorder, unspecified: Secondary | ICD-10-CM | POA: Diagnosis not present

## 2021-05-06 DIAGNOSIS — N3090 Cystitis, unspecified without hematuria: Secondary | ICD-10-CM | POA: Diagnosis not present

## 2021-05-06 DIAGNOSIS — R300 Dysuria: Secondary | ICD-10-CM | POA: Diagnosis not present

## 2021-05-06 LAB — URINALYSIS, ROUTINE W REFLEX MICROSCOPIC
Bilirubin, UA: NEGATIVE
Glucose, UA: NEGATIVE
Nitrite, UA: NEGATIVE
Protein,UA: NEGATIVE
Specific Gravity, UA: 1.02 (ref 1.005–1.030)
Urobilinogen, Ur: 0.2 mg/dL (ref 0.2–1.0)
pH, UA: 5 (ref 5.0–7.5)

## 2021-05-06 LAB — MICROSCOPIC EXAMINATION: Renal Epithel, UA: NONE SEEN /hpf

## 2021-05-06 MED ORDER — NITROFURANTOIN MONOHYD MACRO 100 MG PO CAPS: 100.0000 mg | ORAL_CAPSULE | Freq: Two times a day (BID) | ORAL | 0 refills | Status: AC

## 2021-05-06 NOTE — Progress Notes (Addendum)
Subjective:  Patient ID: Katrina Lin, female    DOB: 07/30/1995, 26 y.o.   MRN: [id number]  Patient Care Team: Gwenlyn Perking, FNP as PCP - General (Family Medicine)   Chief Complaint:  Dysuria   HPI: Katrina Lin is a 26 y.o. female presenting on 05/06/2021 for Dysuria   Dysuria  Associated symptoms include frequency and urgency. Pertinent negatives include no chills, flank pain or hematuria.  1. Dysuria 3 day history of increased frequency, urgency, dysuria and back pain. She is followed by urology for frequent UTIs but is unable to be seen right now due to insurance. She was last treated for a UTI 4 months ago.   2. Anxiety Since childhood, has never been treated. Did not make appointment last time referral to psychology was placed. Desires referral today. Symptoms include racing thoughts, restlessness, irritability, and fear of something bad happening.  3. Depression   Intermittent in occurrence. Symptoms include trouble sleeping, anhedonia, decreased energy.   Depression screen [facility] 2/9 05/06/2021 09/17/2020 01/16/2020 12/12/2019 05/16/2019  Decreased Interest 1 0 0 0 0  Down, Depressed, Hopeless 2 1 0 0 0  PHQ - 2 Score 3 1 0 0 0  Altered sleeping 2 2 - - -  Tired, decreased energy 3 2 - - -  Change in appetite 2 2 - - -  Feeling bad or failure about yourself  0 0 - - -  Trouble concentrating 3 0 - - -  Moving slowly or fidgety/restless 1 0 - - -  Suicidal thoughts 0 0 - - -  PHQ-9 Score 14 7 - - -  Difficult doing work/chores Somewhat difficult Somewhat difficult - - -   GAD 7 : Generalized Anxiety Score 05/06/2021 09/17/2020  Nervous, Anxious, on Edge 3 3  Control/stop worrying 3 3  Worry too much - different things 3 3  Trouble relaxing 3 3  Restless 3 3  Easily annoyed or irritable 3 3  Afraid - awful might happen 3 3  Total GAD 7 Score 21 21  Anxiety Difficulty Somewhat difficult Very difficult        Relevant past medical, surgical, family, and  social history reviewed and updated as indicated.  Allergies and medications reviewed and updated. Data reviewed: Chart in Epic.   Past Medical History:  Diagnosis Date   Anxiety    Panic attacks     Past Surgical History:  Procedure Laterality Date   ADENOIDECTOMY     TONSILLECTOMY      Social History   Socioeconomic History   Marital status: Single    Spouse name: Not on file   Number of children: Not on file   Years of education: Not on file   Highest education level: Not on file  Occupational History   Not on file  Tobacco Use   Smoking status: Never   Smokeless tobacco: Never  Vaping Use   Vaping Use: Never used  Substance and Sexual Activity   Alcohol use: Not Currently   Drug use: Never   Sexual activity: Not on file  Other Topics Concern   Not on file  Social History Narrative   Not on file   Social Determinants of Health   Financial Resource Strain: Not on file  Food Insecurity: Not on file  Transportation Needs: Not on file  Physical Activity: Not on file  Stress: Not on file  Social Connections: Not on file  Intimate Partner Violence: Not on file  Outpatient Encounter Medications as of 05/06/2021  Medication Sig   cetirizine (ZYRTEC ALLERGY) 10 MG tablet Take 1 tablet (10 mg total) by mouth daily.   Cholecalciferol (VITAMIN D3 PO) Take by mouth.   Cyanocobalamin (VITAMIN B-12 PO) Take by mouth.   hydrocortisone-pramoxine (PROCTOFOAM-HC) rectal foam Place 1 applicator rectally 2 (two) times daily as needed for hemorrhoids or anal itching.   naproxen (NAPROSYN) 375 MG tablet Take 1 tablet (375 mg total) by mouth 2 (two) times daily.   Norethin Ace-Eth Estrad-FE (MINASTRIN 24 FE) 1-20 MG-MCG(24) CHEW Chew 1 tablet by mouth daily. Dispense brand name please   [DISCONTINUED] pseudoephedrine (SUDAFED) 30 MG tablet Take 1 tablet (30 mg total) by mouth every 8 (eight) hours as needed for congestion.   No facility-administered encounter medications on  file as of 05/06/2021.    Allergies  Allergen Reactions   Oyster Shell     Positive skin allergy test 02/06/20.     Review of Systems  Constitutional:  Negative for activity change, appetite change, chills, diaphoresis, fatigue, fever and unexpected weight change.  Gastrointestinal:  Negative for abdominal pain.  Genitourinary:  Positive for dysuria, frequency and urgency. Negative for decreased urine volume, difficulty urinating, dyspareunia, enuresis, flank pain, genital sores, hematuria, menstrual problem, pelvic pain, vaginal bleeding, vaginal discharge and vaginal pain.  Musculoskeletal:  Positive for back pain.  Psychiatric/Behavioral:  Positive for dysphoric mood. Negative for self-injury and suicidal ideas. The patient is nervous/anxious.   All other systems reviewed and are negative.      Objective:  BP (!) 106/59    Pulse 83    Temp 98.1 F (36.7 C) (Temporal)    Ht 5' (1.524 m)    Wt 68.6 kg    BMI 29.54 kg/m    Wt Readings from Last 3 Encounters:  05/06/21 68.6 kg  09/17/20 68 kg  05/15/20 68.8 kg    Physical Exam Constitutional:      Appearance: Normal appearance.  HENT:     Head: Normocephalic and atraumatic.  Eyes:     Pupils: Pupils are equal, round, and reactive to light.  Cardiovascular:     Rate and Rhythm: Normal rate and regular rhythm.     Pulses: Normal pulses.     Heart sounds: Normal heart sounds.  Pulmonary:     Effort: Pulmonary effort is normal.     Breath sounds: Normal breath sounds.  Abdominal:     General: There is no distension.     Palpations: There is no mass.     Tenderness: There is no abdominal tenderness. There is no right CVA tenderness, left CVA tenderness, guarding or rebound.     Hernia: No hernia is present.  Musculoskeletal:        General: Normal range of motion.  Skin:    General: Skin is warm and dry.     Capillary Refill: Capillary refill takes less than 2 seconds.  Neurological:     General: No focal deficit  present.     Mental Status: She is alert and oriented to person, place, and time.  Psychiatric:        Attention and Perception: Attention normal.        Mood and Affect: Mood is anxious and depressed.        Behavior: Behavior normal. Behavior is not agitated or hyperactive. Behavior is cooperative.        Thought Content: Thought content normal.        Judgment: Judgment normal.  Results for orders placed or performed during the hospital encounter of 01/08/21  Covid-19, Flu A+B ([facility])   Specimen: Nasopharyngeal   Naso  Result Value Ref Range   SARS-CoV-2, NAA Not Detected Not Detected   Influenza A, NAA Not Detected Not Detected   Influenza B, NAA Not Detected Not Detected   Test Information: Comment   Urine Culture   Specimen: Urine, Clean Catch  Result Value Ref Range   Specimen Description      URINE, CLEAN CATCH Performed at [facility], [address], [place], [place] [address]    Special Requests      NONE Performed at [facility], [address] [place], [place] [address]    Culture MULTIPLE SPECIES PRESENT, SUGGEST RECOLLECTION (A)    Report Status 01/09/2021 FINAL   POCT urinalysis dipstick  Result Value Ref Range   Color, UA yellow yellow   Clarity, UA clear clear   Glucose, UA negative negative mg/dL   Bilirubin, UA negative negative   Ketones, POC UA negative negative mg/dL   Spec Grav, UA 1.025 1.010 - 1.025   Blood, UA negative negative   pH, UA 6.5 5.0 - 8.0   Protein Ur, POC negative negative mg/dL   Urobilinogen, UA 1.0 0.2 or 1.0 E.U./dL   Nitrite, UA Negative Negative   Leukocytes, UA Negative Negative       Pertinent labs & imaging results that were available during my care of the patient were reviewed by me and considered in my medical decision making.  Assessment & Plan:  Breindy was seen today for dysuria.  Diagnoses and all orders for this visit: Cystitis Dysuria -     Urinalysis, Routine w reflex microscopic. UA reveals  ketones, blood, leuks. Will send Macrobid x7 days. Patient instructed regarding antibiotic therapy. Culture pending, will change therapy if warranted.   Anxiety Depression, recurrent ([place]) -GAD 21, PHQ 14. Referral to psychology placed. Discussed starting SSRI for management of symptoms. Patient denies initiation of SSRI therapy.    Continue all other maintenance medications.  Follow up plan: Return if symptoms worsen or fail to improve.   Continue healthy lifestyle choices, including diet (rich in fruits, vegetables, and lean proteins, and low in salt and simple carbohydrates) and exercise (at least 30 minutes of moderate physical activity daily).  Educational handout given for depression  The above assessment and management plan was discussed with the patient. The patient verbalized understanding of and has agreed to the management plan. Patient is aware to call the clinic if they develop any new symptoms or if symptoms persist or worsen. Patient is aware when to return to the clinic for a follow-up visit. Patient educated on when it is appropriate to go to the emergency department.   Addison Pearla Mckinny, NP-S  I personally was present during the history, physical exam, and medical decision-making activities of this visit and have verified that the services and findings are accurately documented in the nurse practitioner student's note.  Monia Pouch, FNP-C [facility] [address] [place], [place] [address] [phone]

## 2021-05-10 LAB — URINE CULTURE

## 2021-06-22 ENCOUNTER — Encounter: Payer: Self-pay | Admitting: Family Medicine

## 2021-06-22 ENCOUNTER — Ambulatory Visit: Payer: BC Managed Care – PPO | Admitting: Family Medicine

## 2021-06-22 VITALS — BP 121/74 | HR 80 | Temp 98.1°F | Ht 60.0 in | Wt 150.2 lb

## 2021-06-22 DIAGNOSIS — M546 Pain in thoracic spine: Secondary | ICD-10-CM

## 2021-06-22 DIAGNOSIS — F419 Anxiety disorder, unspecified: Secondary | ICD-10-CM

## 2021-06-22 DIAGNOSIS — M5431 Sciatica, right side: Secondary | ICD-10-CM | POA: Diagnosis not present

## 2021-06-22 DIAGNOSIS — G8929 Other chronic pain: Secondary | ICD-10-CM

## 2021-06-22 DIAGNOSIS — M62838 Other muscle spasm: Secondary | ICD-10-CM

## 2021-06-22 DIAGNOSIS — F339 Major depressive disorder, recurrent, unspecified: Secondary | ICD-10-CM

## 2021-06-22 MED ORDER — TIZANIDINE HCL 4 MG PO TABS: 4.0000 mg | ORAL_TABLET | Freq: Four times a day (QID) | ORAL | 0 refills | Status: DC | PRN

## 2021-06-22 MED ORDER — NAPROXEN 375 MG PO TABS: 375.0000 mg | ORAL_TABLET | Freq: Two times a day (BID) | ORAL | 5 refills | Status: DC

## 2021-06-22 NOTE — Progress Notes (Signed)
? ?Acute Office Visit ? ?Subjective:  ? ? Patient ID: Katrina Lin, female    DOB: 1995/11/22, 26 y.o.   MRN: [id number] ? ?Chief Complaint  ?Patient presents with  ? Back Pain  ? ? ?HPI ?Patient is in today for back pain. This is a chronic problem. 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 The pain is located thoracic. She also reports spasms for last 2 days.  She has been having pain that radiates down her right leg as well. She is currently out of naproxen, but this has been helpful in the past. PT has also been helpful in the past and would like to do this again. She denies injury, numbness, tingling, saddle anesthesia, or fever.  ? ?She has been referred for psychology for anxiety and depression. The referral has been authorized. She plans to contact the office soon to schedule an appt.  ? ? ?  06/22/2021  ?  4:30 PM 05/06/2021  ?  3:31 PM 09/17/2020  ?  3:27 PM  ?Depression screen PHQ 2/9  ?Decreased Interest 0 1 0  ?Down, Depressed, Hopeless 1 2 1   ?PHQ - 2 Score 1 3 1   ?Altered sleeping 2 2 2   ?Tired, decreased energy 2 3 2   ?Change in appetite 1 2 2   ?Feeling bad or failure about yourself  2 0 0  ?Trouble concentrating 3 3 0  ?Moving slowly or fidgety/restless 0 1 0  ?Suicidal thoughts 0 0 0  ?PHQ-9 Score 11 14 7   ?Difficult doing work/chores Somewhat difficult Somewhat difficult Somewhat difficult  ? ? ?  06/22/2021  ?  4:32 PM 05/06/2021  ?  3:32 PM 09/17/2020  ?  3:28 PM  ?GAD 7 : Generalized Anxiety Score  ?Nervous, Anxious, on Edge 3 3 3   ?Control/stop worrying 3 3 3   ?Worry too much - different things 3 3 3   ?Trouble relaxing 3 3 3   ?Restless 3 3 3   ?Easily annoyed or irritable 3 3 3   ?Afraid - awful might happen 3 3 3   ?Total GAD 7 Score 21 21 21   ?Anxiety Difficulty Somewhat difficult Somewhat difficult Very difficult  ? ? ? ? ?Past Medical History:  ?Diagnosis Date  ? Anxiety   ? Panic attacks   ? ? ?Past Surgical History:   ?Procedure Laterality Date  ? ADENOIDECTOMY    ? TONSILLECTOMY    ? ? ?Family History  ?Problem Relation Age of Onset  ? Hypertension Mother   ? Hyperlipidemia Mother   ? Diabetes Maternal Uncle   ? Hypertension Paternal Grandfather   ? Hyperlipidemia Paternal Grandfather   ? ? ?Social History  ? ?Socioeconomic History  ? Marital status: Single  ?  Spouse name: Not on file  ? Number of children: Not on file  ? Years of education: Not on file  ? Highest education level: Not on file  ?Occupational History  ? Not on file  ?Tobacco Use  ? Smoking status: Never  ? Smokeless tobacco: Never  ?Vaping Use  ? Vaping Use: Never used  ?Substance and Sexual Activity  ? Alcohol use: Not Currently  ? Drug use: Never  ? Sexual activity: Not on file  ?Other Topics Concern  ? Not on file  ?Social History Narrative  ? Not on file  ? ?Social Determinants of Health  ? ?Financial Resource Strain: Not on file  ?Food Insecurity: Not on file  ?Transportation Needs:  Not on file  ?Physical Activity: Not on file  ?Stress: Not on file  ?Social Connections: Not on file  ?Intimate Partner Violence: Not on file  ? ? ?Outpatient Medications Prior to Visit  ?Medication Sig Dispense Refill  ? Cholecalciferol (VITAMIN D3 PO) Take by mouth.    ? Cyanocobalamin (VITAMIN B-12 PO) Take by mouth.    ? hydrocortisone-pramoxine (PROCTOFOAM-HC) rectal foam Place 1 applicator rectally 2 (two) times daily as needed for hemorrhoids or anal itching. 10 g 1  ? Norethin Ace-Eth Estrad-FE (MINASTRIN 24 FE) 1-20 MG-MCG(24) CHEW Chew 1 tablet by mouth daily. Dispense brand name please 28 tablet 12  ? naproxen (NAPROSYN) 375 MG tablet Take 1 tablet (375 mg total) by mouth 2 (two) times daily. (Patient not taking: Reported on 06/22/2021) 20 tablet 5  ? cetirizine (ZYRTEC ALLERGY) 10 MG tablet Take 1 tablet (10 mg total) by mouth daily. 30 tablet 0  ? ?No facility-administered medications prior to visit.  ? ? ?Allergies  ?Allergen Reactions  ? Oyster Shell   ?  Positive  skin allergy test 02/06/20.   ? Shrimp Extract Allergy Skin Test   ? ? ?Review of Systems ?As per HPI.  ?   ?Objective:  ?  ?Physical Exam ?Vitals and nursing note reviewed.  ?Constitutional:   ?   General: She is not in acute distress. ?   Appearance: She is not ill-appearing, toxic-appearing or diaphoretic.  ?Pulmonary:  ?   Effort: Pulmonary effort is normal. No respiratory distress.  ?Musculoskeletal:  ?   Thoracic back: Normal.  ?   Lumbar back: Normal.  ?   Right lower leg: No edema.  ?   Left lower leg: No edema.  ?Skin: ?   General: Skin is warm.  ?Neurological:  ?   Mental Status: She is alert and oriented to person, place, and time.  ?   Motor: No weakness.  ?   Gait: Gait normal.  ?Psychiatric:     ?   Mood and Affect: Mood normal.     ?   Behavior: Behavior normal.     ?   Thought Content: Thought content normal.  ? ? ?BP 121/74   Pulse 80   Temp 98.1 ?F (36.7 ?C) (Temporal)   Ht 5' (1.524 m)   Wt 150 lb 4 oz (68.2 kg)   BMI 29.34 kg/m?  ?Wt Readings from Last 3 Encounters:  ?06/22/21 150 lb 4 oz (68.2 kg)  ?05/06/21 151 lb 4 oz (68.6 kg)  ?09/17/20 150 lb (68 kg)  ? ? ?Health Maintenance Due  ?Topic Date Due  ? COVID-19 Vaccine (1) Never done  ? HPV VACCINES (1 - 2-dose series) Never done  ? HIV Screening  Never done  ? Hepatitis C Screening  Never done  ? ? ?   ?Topic Date Due  ? HPV VACCINES (1 - 2-dose series) Never done  ? ? ? ?Lab Results  ?Component Value Date  ? TSH 1.200 09/17/2020  ? ?Lab Results  ?Component Value Date  ? WBC 8.6 09/17/2020  ? HGB 13.5 09/17/2020  ? HCT 40.8 09/17/2020  ? MCV 90 09/17/2020  ? PLT 344 09/17/2020  ? ?Lab Results  ?Component Value Date  ? NA 139 09/17/2020  ? K 4.0 09/17/2020  ? CO2 21 09/17/2020  ? GLUCOSE 96 09/17/2020  ? BUN 8 09/17/2020  ? CREATININE 0.75 09/17/2020  ? BILITOT 0.2 09/17/2020  ? ALKPHOS 57 09/17/2020  ? AST 12 09/17/2020  ?  ALT 12 09/17/2020  ? PROT 7.8 09/17/2020  ? ALBUMIN 4.6 09/17/2020  ? CALCIUM 9.7 09/17/2020  ? EGFR 113 09/17/2020   ? ?Lab Results  ?Component Value Date  ? CHOL 235 (H) 09/17/2020  ? ?Lab Results  ?Component Value Date  ? HDL 59 09/17/2020  ? ?Lab Results  ?Component Value Date  ? LDLCALC 145 (H) 09/17/2020  ? ?Lab Results  ?Component Value Date  ? TRIG 175 (H) 09/17/2020  ? ?Lab Results  ?Component Value Date  ? CHOLHDL 4.0 09/17/2020  ? ?No results found for: HGBA1C ? ?   ?Assessment & Plan:  ? ?Amiaya was seen today for back pain. ? ?Diagnoses and all orders for this visit: ? ?Chronic bilateral thoracic back pain ?Right sided sciatica ?Muscle spasm ?Chronic. Naproxen refilled today. Zanaflex prn for spasms. PT referral placed.  ?-     naproxen (NAPROSYN) 375 MG tablet; Take 1 tablet (375 mg total) by mouth 2 (two) times daily. ?-     tiZANidine (ZANAFLEX) 4 MG tablet; Take 1 tablet (4 mg total) by mouth every 6 (six) hours as needed for muscle spasms. ?-     Ambulatory referral to Physical Therapy ? ?Anxiety ?Depression, recurrent ([place]) ?Referral has been authorized. Patient is aware of contact information for office to schedule appt. Denies SI.  ? ?Return in about 3 months (around 09/17/2021) for CPE. ? ?The patient indicates understanding of these issues and agrees with the plan. ? ?Gwenlyn Perking, FNP ? ?

## 2021-06-22 NOTE — Patient Instructions (Signed)

## 2021-06-27 ENCOUNTER — Other Ambulatory Visit: Payer: Self-pay | Admitting: Family Medicine

## 2021-06-27 DIAGNOSIS — M62838 Other muscle spasm: Secondary | ICD-10-CM

## 2021-06-27 DIAGNOSIS — G8929 Other chronic pain: Secondary | ICD-10-CM

## 2021-06-30 ENCOUNTER — Encounter: Payer: Self-pay | Admitting: Physical Therapy

## 2021-06-30 ENCOUNTER — Ambulatory Visit: Payer: BC Managed Care – PPO | Attending: Family Medicine | Admitting: Physical Therapy

## 2021-06-30 DIAGNOSIS — M6281 Muscle weakness (generalized): Secondary | ICD-10-CM | POA: Diagnosis present

## 2021-06-30 DIAGNOSIS — M546 Pain in thoracic spine: Secondary | ICD-10-CM | POA: Diagnosis present

## 2021-06-30 DIAGNOSIS — M5431 Sciatica, right side: Secondary | ICD-10-CM | POA: Insufficient documentation

## 2021-06-30 DIAGNOSIS — G8929 Other chronic pain: Secondary | ICD-10-CM | POA: Insufficient documentation

## 2021-06-30 NOTE — Therapy (Signed)
[place] ?[facility] ?[address] ?[place], [place], [address] ?Phone: [phone]   Fax:  [phone] ? ?Physical Therapy Evaluation ? ?Patient Details  ?Name: Katrina Lin ?MRN: [id number] ?Date of Birth: 1995/07/02 ?Referring Provider (PT): Marjorie Smolder ? ? ?Encounter Date: 06/30/2021 ? ? PT End of Session - 06/30/21 1510   ? ? Visit Number 1   ? Number of Visits 8   ? Date for PT Re-Evaluation 07/28/21   ? PT Start Time 0230   ? PT Stop Time 0308   ? PT Time Calculation (min) 38 min   ? Activity Tolerance Patient tolerated treatment well   ? Behavior During Therapy [facility] for tasks assessed/performed   ? ?  ?  ? ?  ? ? ?Past Medical History:  ?Diagnosis Date  ? Anxiety   ? Panic attacks   ? ? ?Past Surgical History:  ?Procedure Laterality Date  ? ADENOIDECTOMY    ? TONSILLECTOMY    ? ? ?There were no vitals filed for this visit. ? ? ? Subjective Assessment - 06/30/21 1548   ? ? 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  She rates her pain at a 9/10 today which is at the end of her workday.  Lying or sititng down decreases her pain.  Bedning forward and lifting increase her pain.   ? Pertinent History anxiety   ? Limitations Lifting;House hold activities   ? Patient Stated Goals return to PLOF   ? Currently in Pain? Yes   ? Pain Score 9    ? Pain Location Back   ? Pain Orientation Mid   ? Pain Descriptors / Indicators Spasm;Tingling   ? Pain Type Chronic pain   ? Pain Onset 1 to 4 weeks ago   ? Pain Frequency Constant   ? Aggravating Factors  See above.   ? Pain Relieving Factors See above.   ? ?  ?  ? ?  ? ? ? ? ? [facility] PT Assessment - 06/30/21 0001   ? ?  ? Assessment  ? Medical Diagnosis Chronic bilateral thoracic pain.   ? Referring Provider (PT) Marjorie Smolder   ? Onset Date/Surgical Date --   Recent  exacerbation about two weeks ago.  ?  ? Precautions  ? Precaution Comments Latex allergy.   ?  ? Restrictions  ? Weight Bearing Restrictions No   ?  ? Balance Screen  ? Has the patient fallen in the past 6 months No   ? Has the patient had a decrease in activity level because of a fear of falling?  No   ? Is the patient reluctant to leave their home because of a fear of falling?  No   ?  ? Home Environment  ? Living Environment Private residence   ?  ? Prior Function  ? Level of Independence Independent   ? 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  ?  ? Posture/Postural Control  ? Posture/Postural Control Postural limitations   ? Postural Limitations Forward head;Rounded Shoulders   ? Posture Comments Bilateral hyper elbow extension.   ?  ? ROM / Strength  ? AROM / PROM / Strength AROM;Strength   ?  ? AROM  ? Overall AROM Comments Full active trunk flexion and extension.  Full bilateral UE AROM.   ?  ? Strength  ? Overall Strength Comments Right shoulder flexion 4/5, left is 4 to 4+/5.  Scapular strength bilaterally is 4- to 4/5.   ?  ? Palpation  ? Palpation comment Patient c/o tenderness diffusely and bilaterally over her mid-thoracic region lessening into her lower thoracic region.   ?  ? Ambulation/Gait  ? Gait Comments WNL.   ? ?  ?  ? ?  ? ? ? ? ? ? ? ? ? ? ? ? ? ?Objective measurements completed on examination: See above findings.  ? ? ? ? ? [place] Adult PT Treatment/Exercise - 06/30/21 0001   ? ?  ? Modalities  ? Modalities Electrical Stimulation;Moist Heat   ?  ? Moist Heat Therapy  ? Number Minutes Moist Heat 15 Minutes   ? Moist Heat Location --   Mid-thoracic.  ?  ? Electrical Stimulation  ? Electrical Stimulation Location Mid-thoracic   ? Electrical Stimulation Action IFC at 80-150 Hz.   ? Electrical Stimulation Parameters 40% scan x 15 minutes.   ? Electrical Stimulation Goals Pain;Tone   ? ?  ?  ? ?  ? ? ? ? ? ? ? ? ? ? ? ? ? ? ? PT Long Term  Goals - 06/30/21 1642   ? ?  ? PT LONG TERM GOAL #1  ? Title Patient will be independent with HEP and its progression   ? Time 4   ? Period Weeks   ? Status New   ?  ? PT LONG TERM GOAL #2  ? Title Patient will demonstrate 4+/5 or greater UE strength bilaterally to improve stability during functional tasks and improve posture.   ? Time 4   ? Period Weeks   ? Status New   ?  ? PT LONG TERM GOAL #3  ? Title Patient perform AD's with pain not > 3-4/10.   ? Time 4   ? Period Weeks   ? Status New   ? ?  ?  ? ?  ? ? ? ? ? ? ? ? ? Plan - 06/30/21 1558   ? ? Clinical Impression Statement The patient presenst to OPPT with chronic mid-back with a recent exacerbation about two weeks ago related to lifting on her job.  She had diffuse bilateral mid-thoracic pain lessening into her lower thoracic region.  She has some scapular weakness.  Her bilateral UE DTR's are intact.  Patient will benefit from skilled physical therapy intervention to address pain and deficits.   ? Personal Factors and Comorbidities Time since onset of injury/illness/exacerbation;Other   ? Examination-Activity Limitations Reach Overhead;Carry;Lift   ? Examination-Participation Restrictions Occupation   ? Stability/Clinical Decision Making Stable/Uncomplicated   ? Rehab Potential Good   ? PT Frequency 2x / week   ? PT Duration 4 weeks   ? PT Treatment/Interventions Cryotherapy;Electrical Stimulation;ADLs/Self Care Home Management;Functional mobility training;Therapeutic activities;Therapeutic exercise;Spinal Manipulations;Passive range of motion;Patient/family education;Neuromuscular re-education;Manual techniques;Ultrasound   ? PT Next Visit Plan Shoulder and scapular strengthening, modalities and STW/M as needed.   ? Consulted and Agree with Plan of Care Patient   ? ?  ?  ? ?  ? ? ?Patient will benefit from skilled therapeutic intervention in order to improve the following deficits and impairments:  Decreased activity tolerance, Decreased strength, Pain,  Postural dysfunction ? ?Visit Diagnosis: ?Pain in thoracic spine - Plan: PT plan of care cert/re-cert ? ?Muscle weakness (generalized) - Plan: PT plan of care  cert/re-cert ? ? ? ? ?Problem List ?Patient Active Problem List  ? Diagnosis Date Noted  ? Chronic midline low back pain with left-sided sciatica 01/12/2018  ? BMI 29.0-29.9,adult 01/12/2018  ? Generalized anxiety disorder 11/04/2016  ? ? ?Nayara Taplin, [place], PT ?06/30/2021, 4:48 PM ? ?[place] ?[facility] ?[address] ?[place], [place], [address] ?Phone: [phone]   Fax:  [phone] ? ?Name: Nikea Settle ?MRN: [id number] ?Date of Birth: [birth year] ? ? ?

## 2021-07-07 ENCOUNTER — Ambulatory Visit: Payer: BC Managed Care – PPO | Attending: Family Medicine | Admitting: *Deleted

## 2021-07-07 DIAGNOSIS — M546 Pain in thoracic spine: Secondary | ICD-10-CM | POA: Insufficient documentation

## 2021-07-07 DIAGNOSIS — M6281 Muscle weakness (generalized): Secondary | ICD-10-CM | POA: Insufficient documentation

## 2021-07-07 DIAGNOSIS — G8929 Other chronic pain: Secondary | ICD-10-CM | POA: Diagnosis present

## 2021-07-07 DIAGNOSIS — R293 Abnormal posture: Secondary | ICD-10-CM | POA: Insufficient documentation

## 2021-07-07 DIAGNOSIS — M545 Low back pain, unspecified: Secondary | ICD-10-CM | POA: Insufficient documentation

## 2021-07-07 NOTE — Therapy (Signed)
[place] ?[facility] ?[address] ?[place], [place], [address] ?Phone: [phone]   Fax:  [phone] ? ?Physical Therapy Treatment ? ?Patient Details  ?Name: Katrina Lin ?MRN: [id number] ?Date of Birth: Nov 28, 1995 ?Referring Provider (PT): Marjorie Smolder ? ? ?Encounter Date: 07/07/2021 ? ? PT End of Session - 07/07/21 1532   ? ? Visit Number 2   ? Number of Visits 8   ? Date for PT Re-Evaluation 07/28/21   ? PT Start Time 1430   ? PT Stop Time 1520   ? PT Time Calculation (min) 50 min   ? ?  ?  ? ?  ? ? ?Past Medical History:  ?Diagnosis Date  ? Anxiety   ? Panic attacks   ? ? ?Past Surgical History:  ?Procedure Laterality Date  ? ADENOIDECTOMY    ? TONSILLECTOMY    ? ? ?There were no vitals filed for this visit. ? ? Subjective Assessment - 07/07/21 1429   ? ? Subjective 7/10 pain today  midback   ? Pertinent History anxiety   ? Limitations Lifting;House hold activities   ? Patient Stated Goals return to PLOF   ? Currently in Pain? Yes   ? Pain Score 7    ? Pain Location Back   ? Pain Orientation Mid   ? Pain Descriptors / Indicators Spasm   ? Pain Type Chronic pain   ? Pain Onset 1 to 4 weeks ago   ? ?  ?  ? ?  ? ? ? ? ? ? ? ? ? ? ? ? ? ? ? ? ? ? ? ? [place] Adult PT Treatment/Exercise - 07/07/21 0001   ? ?  ? Self-Care  ? Self-Care ADL's;Lifting;Posture   ? ADL's Reviewed and demonstrated hinge bending and body mechanics   ?  ? Exercises  ? Exercises Lumbar;Knee/Hip   ?  ? Lumbar Exercises: Standing  ? Other Standing Lumbar Exercises Red Tband H-ABD 2x10-15 with focus on keeping shldr blades depressed   ? Other Standing Lumbar Exercises Handout given for HEP   ?  ? Lumbar Exercises: Supine  ? Ab Set 10 reps;5 seconds   ? Dead Bug 10 reps;5 seconds   ? Other Supine Lumbar Exercises Drawin x10 hold 5 secs. Practiced diaphragmatic breathing   ?  ? Modalities  ? Modalities Electrical Stimulation;Moist Heat   ?  ? Moist Heat Therapy  ? Number Minutes Moist Heat 15 Minutes   ? Moist  Heat Location --   Mid-thoracic.  ?  ? Electrical Stimulation  ? Electrical Stimulation Location Mid-thoracic   ? Electrical Stimulation Goals Pain;Tone   ? ?  ?  ? ?  ? ? ? ? ? ? ? ? ? ? ? ? ? ? ? PT Long Term Goals - 06/30/21 1642   ? ?  ? PT LONG TERM GOAL #1  ? Title Patient will be independent with HEP and its progression   ? Time 4   ? Period Weeks   ? Status New   ?  ? PT LONG TERM GOAL #2  ? Title Patient will demonstrate 4+/5 or greater UE strength bilaterally to improve stability during functional tasks and improve posture.   ? Time 4   ? Period Weeks   ? Status New   ?  ? PT LONG TERM GOAL #3  ? Title Patient perform AD's with pain not > 3-4/10.   ? Time 4   ? Period Weeks   ?  Status New   ? ?  ?  ? ?  ? ? ? ? ? ? ? ? Plan - 07/07/21 1430   ? ? Clinical Impression Statement Pt arrived today doing faily well with mid-back. Rx focused on core activation and stability as well as body mechanics to prevent pain triggers. Pt was issued red tband for mid-back exs and a handout for core activation exs. IFC end of session. Pt did well with Rx and reported decreased pain end of session   ? Personal Factors and Comorbidities Time since onset of injury/illness/exacerbation;Other   ? Examination-Activity Limitations Reach Overhead;Carry;Lift   ? Examination-Participation Restrictions Occupation   ? Stability/Clinical Decision Making Stable/Uncomplicated   ? Rehab Potential Good   ? PT Frequency 2x / week   ? PT Duration 4 weeks   ? PT Treatment/Interventions Cryotherapy;Electrical Stimulation;ADLs/Self Care Home Management;Functional mobility training;Therapeutic activities;Therapeutic exercise;Spinal Manipulations;Passive range of motion;Patient/family education;Neuromuscular re-education;Manual techniques;Ultrasound   ? PT Next Visit Plan Shoulder and scapular strengthening, modalities and STW/M as needed.   ? Consulted and Agree with Plan of Care Patient   ? ?  ?  ? ?  ? ? ?Patient will benefit from skilled  therapeutic intervention in order to improve the following deficits and impairments:  Decreased activity tolerance, Decreased strength, Pain, Postural dysfunction ? ?Visit Diagnosis: ?Pain in thoracic spine ? ?Muscle weakness (generalized) ? ?Chronic midline low back pain, unspecified whether sciatica present ? ?Abnormal posture ? ? ? ? ?Problem List ?Patient Active Problem List  ? Diagnosis Date Noted  ? Chronic midline low back pain with left-sided sciatica 01/12/2018  ? BMI 29.0-29.9,adult 01/12/2018  ? Generalized anxiety disorder 11/04/2016  ? ? ?Savannah Erbe,CHRIS, PTA ?07/07/2021, 3:40 PM ? ?[place] ?[facility] ?[address] ?[place], [place], [address] ?Phone: [phone]   Fax:  [phone] ? ?Name: Katrina Lin ?MRN: [id number] ?Date of Birth: [birth year] ? ? ? ?

## 2021-07-09 ENCOUNTER — Ambulatory Visit: Payer: BC Managed Care – PPO | Admitting: *Deleted

## 2021-07-09 DIAGNOSIS — G8929 Other chronic pain: Secondary | ICD-10-CM

## 2021-07-09 DIAGNOSIS — M6281 Muscle weakness (generalized): Secondary | ICD-10-CM

## 2021-07-09 DIAGNOSIS — R293 Abnormal posture: Secondary | ICD-10-CM

## 2021-07-09 DIAGNOSIS — M546 Pain in thoracic spine: Secondary | ICD-10-CM

## 2021-07-09 NOTE — Therapy (Signed)
[place] ?[facility] ?[address] ?[place], [place], [address] ?Phone: [phone]   Fax:  [phone] ? ?Physical Therapy Treatment ? ?Patient Details  ?Name: Katrina Lin ?MRN: [id number] ?Date of Birth: 11/23/1995 ?Referring Provider (PT): Marjorie Smolder ? ? ?Encounter Date: 07/09/2021 ? ? PT End of Session - 07/09/21 1532   ? ? Visit Number 3   ? Number of Visits 8   ? Date for PT Re-Evaluation 07/28/21   ? PT Start Time 1430   ? PT Stop Time 1520   ? PT Time Calculation (min) 50 min   ? ?  ?  ? ?  ? ? ?Past Medical History:  ?Diagnosis Date  ? Anxiety   ? Panic attacks   ? ? ?Past Surgical History:  ?Procedure Laterality Date  ? ADENOIDECTOMY    ? TONSILLECTOMY    ? ? ?There were no vitals filed for this visit. ? ? Subjective Assessment - 07/09/21 1434   ? ? Subjective 6-7/10 today pain midback with some mm spasms at times   ? Pertinent History anxiety   ? Limitations Lifting;House hold activities   ? Patient Stated Goals return to PLOF   ? Currently in Pain? Yes   ? Pain Score 7    ? Pain Location Back   ? Pain Orientation Mid   ? Pain Descriptors / Indicators Spasm   ? Pain Type Chronic pain   ? Pain Onset 1 to 4 weeks ago   ? ?  ?  ? ?  ? ? ? ? ? ? ? ? ? ? ? ? ? ? ? ? ? ? ? ? [place] Adult PT Treatment/Exercise - 07/09/21 0001   ? ?  ? Exercises  ? Exercises Lumbar;Knee/Hip   ?  ? Lumbar Exercises: Standing  ? Row Strengthening;20 reps   XTS orange 2x10 hold 5 secs  ? Shoulder Extension Strengthening;Both;20 reps   XTS orange 2x10 hold 5 secs  ? Other Standing Lumbar Exercises Green Tband H-ABD 2x10 hold 5 secs with focus on keeping shldr blades depressed   ?  ? Lumbar Exercises: Supine  ? Other Supine Lumbar Exercises Drawin x10 hold 5 secs. Practiced diaphragmatic breathing   ?  ? Lumbar Exercises: Quadruped  ? [place]/Old Horse 10 reps   ? Single Arm Raise 10 reps;5 seconds   ? Straight Leg Raise 10 reps;5 seconds   ?  ? Modalities  ? Modalities Electrical  Stimulation;Moist Heat   ?  ? Moist Heat Therapy  ? Number Minutes Moist Heat 15 Minutes   ? Moist Heat Location --   Mid-thoracic.  ?  ? Electrical Stimulation  ? Electrical Stimulation Location Mid-thoracic   ? Electrical Stimulation Action IFC x 15 mins   ? Electrical Stimulation Parameters 80-'150hz'$    ? Electrical Stimulation Goals Pain;Tone   ? ?  ?  ? ?  ? ? ? ? ? ? ? ? ? ? ? ? ? ? ? PT Long Term Goals - 06/30/21 1642   ? ?  ? PT LONG TERM GOAL #1  ? Title Patient will be independent with HEP and its progression   ? Time 4   ? Period Weeks   ? Status New   ?  ? PT LONG TERM GOAL #2  ? Title Patient will demonstrate 4+/5 or greater UE strength bilaterally to improve stability during functional tasks and improve posture.   ? Time 4   ? Period Weeks   ? Status  New   ?  ? PT LONG TERM GOAL #3  ? Title Patient perform AD's with pain not > 3-4/10.   ? Time 4   ? Period Weeks   ? Status New   ? ?  ?  ? ?  ? ? ? ? ? ? ? ? Plan - 07/09/21 1724   ? ? Clinical Impression Statement Pt arrived today doing fairly well, but with intermittent back spasms after lifting. Rx focused on core strengthening as well as scapular/posture exs.Modified bird-dog with just arm raise and leg raise performed today. No mm spasms during Rx   ? Personal Factors and Comorbidities Time since onset of injury/illness/exacerbation;Other   ? Examination-Activity Limitations Reach Overhead;Carry;Lift   ? Examination-Participation Restrictions Occupation   ? Stability/Clinical Decision Making Stable/Uncomplicated   ? Rehab Potential Good   ? PT Frequency 2x / week   ? PT Duration 4 weeks   ? PT Treatment/Interventions Cryotherapy;Electrical Stimulation;ADLs/Self Care Home Management;Functional mobility training;Therapeutic activities;Therapeutic exercise;Spinal Manipulations;Passive range of motion;Patient/family education;Neuromuscular re-education;Manual techniques;Ultrasound   ? PT Next Visit Plan Shoulder and scapular strengthening, modalities and  STW/M as needed.   ? Consulted and Agree with Plan of Care Patient   ? ?  ?  ? ?  ? ? ?Patient will benefit from skilled therapeutic intervention in order to improve the following deficits and impairments:  Decreased activity tolerance, Decreased strength, Pain, Postural dysfunction ? ?Visit Diagnosis: ?Pain in thoracic spine ? ?Muscle weakness (generalized) ? ?Chronic midline low back pain, unspecified whether sciatica present ? ?Abnormal posture ? ? ? ? ?Problem List ?Patient Active Problem List  ? Diagnosis Date Noted  ? Chronic midline low back pain with left-sided sciatica 01/12/2018  ? BMI 29.0-29.9,adult 01/12/2018  ? Generalized anxiety disorder 11/04/2016  ? ? ?Jhordan Kinter,CHRIS, PTA ?07/09/2021, 5:28 PM ? ?[place] ?[facility] ?[address] ?[place], [place], [address] ?Phone: [phone]   Fax:  [phone] ? ?Name: Katrina Lin ?MRN: [id number] ?Date of Birth: 04-08-1995 ? ? ? ?

## 2021-07-21 ENCOUNTER — Encounter: Payer: Self-pay | Admitting: Physical Therapy

## 2021-07-21 ENCOUNTER — Ambulatory Visit: Payer: BC Managed Care – PPO | Admitting: Physical Therapy

## 2021-07-21 DIAGNOSIS — M546 Pain in thoracic spine: Secondary | ICD-10-CM

## 2021-07-21 NOTE — Therapy (Signed)
Eugenio Saenz ?[facility] ?[place] ?[place], [place], [address] ?Phone: [phone]   Fax:  [phone] ? ?Physical Therapy Treatment ? ?Patient Details  ?Name: Katrina Lin ?MRN: [id number] ?Date of Birth: 09-25-1995 ?Referring Provider (PT): Marjorie Smolder ? ? ?Encounter Date: 07/21/2021 ? ? PT End of Session - 07/21/21 1541   ? ? Visit Number 4   ? Number of Visits 8   ? Date for PT Re-Evaluation 07/28/21   ? PT Start Time [phone]   ? PT Stop Time 0320   ? PT Time Calculation (min) 48 min   ? Activity Tolerance Patient tolerated treatment well   ? Behavior During Therapy [facility] for tasks assessed/performed   ? ?  ?  ? ?  ? ? ?Past Medical History:  ?Diagnosis Date  ? Anxiety   ? Panic attacks   ? ? ?Past Surgical History:  ?Procedure Laterality Date  ? ADENOIDECTOMY    ? TONSILLECTOMY    ? ? ?There were no vitals filed for this visit. ? ? Subjective Assessment - 07/21/21 1536   ? ? Subjective Pain about a 6 today.   ? Pertinent History anxiety   ? Limitations Lifting;House hold activities   ? Patient Stated Goals return to PLOF   ? Currently in Pain? Yes   ? Pain Location Back   ? Pain Orientation Mid   ? Pain Descriptors / Indicators Spasm   ? Pain Type Chronic pain   ? Pain Onset More than a month ago   ? ?  ?  ? ?  ? ? ? ? ? ? ? ? ? ? ? ? ? ? ? ? ? ? ? ? [facility] PT Treatment/Exercise - 07/21/21 0001   ? ?  ? Exercises  ? Exercises Knee/Hip   ?  ? Lumbar Exercises: Aerobic  ? Nustep Level 4 x 10 minutes.   ? Other Aerobic Exercise Standing UBE at 90 RPM's x 6 minutes (3 minutes forward and 3 minutes backward).   ?  ? Shoulder Exercises: Standing  ? Other Standing Exercises Corner stretch x 1 minutes, Blue tube XTS x 2 minutes into scapular retraction, 2 minutes into bilateral shoulder extension with elbows extended and 2 minutes into forward punches.   ?  ? Modalities  ? Modalities Electrical Stimulation;Moist Heat   ?  ? Moist Heat Therapy  ? Number Minutes Moist Heat 20  Minutes   ? Moist Heat Location --   Thoracic.  ?  ? Electrical Stimulation  ? Electrical Stimulation Location Mid-Thoracic   ? Electrical Stimulation Action IFC at 80-150 Hz.   ? Electrical Stimulation Parameters 40% scan x 20 minutes.   ? Electrical Stimulation Goals Pain;Tone   ? ?  ?  ? ?  ? ? ? ? ? ? ? ? ? ? ? ? ? ? ? PT Long Term Goals - 06/30/21 1642   ? ?  ? PT LONG TERM GOAL #1  ? Title Patient will be independent with HEP and its progression   ? Time 4   ? Period Weeks   ? Status New   ?  ? PT LONG TERM GOAL #2  ? Title Patient will demonstrate 4+/5 or greater UE strength bilaterally to improve stability during functional tasks and improve posture.   ? Time 4   ? Period Weeks   ? Status New   ?  ? PT LONG TERM GOAL #3  ? Title Patient perform AD's with pain  not > 3-4/10.   ? Time 4   ? Period Weeks   ? Status New   ? ?  ?  ? ?  ? ? ? ? ? ? ? ? Plan - 07/21/21 1540   ? ? Clinical Impression Statement Patient arriving with back pain now some.  She did an excellent job with ther ex today without complaint.  She finds electrical stimulation helpful and enjoyable.   ? Personal Factors and Comorbidities Time since onset of injury/illness/exacerbation;Other   ? Examination-Activity Limitations Reach Overhead;Carry;Lift   ? Examination-Participation Restrictions Occupation   ? Stability/Clinical Decision Making Stable/Uncomplicated   ? Rehab Potential Good   ? PT Frequency 2x / week   ? PT Duration 4 weeks   ? PT Treatment/Interventions Cryotherapy;Electrical Stimulation;ADLs/Self Care Home Management;Functional mobility training;Therapeutic activities;Therapeutic exercise;Spinal Manipulations;Passive range of motion;Patient/family education;Neuromuscular re-education;Manual techniques;Ultrasound   ? PT Next Visit Plan Shoulder and scapular strengthening, modalities and STW/M as needed.   ? Consulted and Agree with Plan of Care Patient   ? ?  ?  ? ?  ? ? ?Patient will benefit from skilled therapeutic intervention  in order to improve the following deficits and impairments:  Decreased activity tolerance, Decreased strength, Pain, Postural dysfunction ? ?Visit Diagnosis: ?Pain in thoracic spine ? ? ? ? ?Problem List ?Patient Active Problem List  ? Diagnosis Date Noted  ? Chronic midline low back pain with left-sided sciatica 01/12/2018  ? BMI 29.0-29.9,adult 01/12/2018  ? Generalized anxiety disorder 11/04/2016  ? ? ?Tatum Corl, [place], PT ?07/21/2021, 3:42 PM ? ?[place] ?[facility] ?[place] ?[place], [place], [address] ?Phone: [phone]   Fax:  [phone] ? ?Name: Jazzmen Restivo ?MRN: [id number] ?Date of Birth: 11-16-1995 ? ? ? ?

## 2021-07-23 ENCOUNTER — Encounter: Payer: Self-pay | Admitting: Physical Therapy

## 2021-07-23 ENCOUNTER — Ambulatory Visit: Payer: BC Managed Care – PPO | Admitting: Physical Therapy

## 2021-07-23 DIAGNOSIS — M546 Pain in thoracic spine: Secondary | ICD-10-CM

## 2021-07-23 DIAGNOSIS — M6281 Muscle weakness (generalized): Secondary | ICD-10-CM

## 2021-07-23 NOTE — Therapy (Addendum)
[facility] [place], [place], [address] Phone: [phone]   Fax:  [phone]  Physical Therapy Treatment  Patient Details  Name: Katrina Lin MRN: [id number] Date of Birth: [birth year] Referring Provider (PT): Marjorie Smolder   Encounter Date: 07/23/2021   PT End of Session - 07/23/21 1506     Visit Number 5    Number of Visits 8    Date for PT Re-Evaluation 07/28/21    PT Start Time 0932    PT Stop Time 1604    PT Time Calculation (min) 50 min    Activity Tolerance Patient tolerated treatment well    Behavior During Therapy [facility] for tasks assessed/performed             Past Medical History:  Diagnosis Date   Anxiety    Panic attacks     Past Surgical History:  Procedure Laterality Date   ADENOIDECTOMY     TONSILLECTOMY      There were no vitals filed for this visit.   Subjective Assessment - 07/23/21 1505     Subjective Hurt  some today while lifting at work.    Pertinent History anxiety    Limitations Lifting;House hold activities    Patient Stated Goals return to PLOF    Currently in Pain? Other (Comment)   No pain assessment provided               [facility] PT Assessment - 07/23/21 0001       Assessment   Medical Diagnosis Chronic bilateral thoracic pain.    Referring Provider (PT) Marjorie Smolder      Precautions   Precaution Comments Latex allergy.      Restrictions   Weight Bearing Restrictions No                           [facility] Adult PT Treatment/Exercise - 07/23/21 0001       Exercises   Exercises Shoulder;Lumbar      Lumbar Exercises: Aerobic   Nustep Level 4 x 10 minutes.    Other Aerobic Exercise Standing UBE at 90 RPM's x 6 minutes (3 minutes forward and 3 minutes backward).      Lumbar Exercises: Prone   Single Arm Raise Right;Left;10 reps    Single Arm Raises Limitations mod QP      Shoulder Exercises: Standing   Horizontal ABduction  Strengthening;Both;20 reps;Theraband    Theraband Level (Shoulder Horizontal ABduction) Level 2 (Red)    Flexion Strengthening;Both;20 reps;Theraband    Flexion Limitations with horizontal abduction    Extension Strengthening;Both;20 reps    Extension Limitations Blue XTS    Row Strengthening;Both;20 reps    Row Limitations Blue XTS    Other Standing Exercises Lat puldown 30# 2x10 rpes    Other Standing Exercises Wall walks red theraband x2 RT      Shoulder Exercises: ROM/Strengthening   Wall Pushups 20 reps;Limitations    Wall Pushups Limitations with red theraband      Modalities   Modalities Electrical Stimulation      Electrical Stimulation   Electrical Stimulation Location Mid-Thoracic    Electrical Stimulation Action IFC    Electrical Stimulation Parameters 80-150 hz x10 min    Electrical Stimulation Goals Pain                          PT Long Term Goals - 07/23/21 1507  PT LONG TERM GOAL #1   Title Patient will be independent with HEP and its progression    Time 4    Period Weeks    Status On-going      PT LONG TERM GOAL #2   Title Patient will demonstrate 4+/5 or greater UE strength bilaterally to improve stability during functional tasks and improve posture.    Time 4    Period Weeks    Status On-going      PT LONG TERM GOAL #3   Title Patient perform AD's with pain not > 3-4/10.    Time 4    Period Weeks    Status On-going                   Plan - 07/23/21 1639     Clinical Impression Statement Patient presented in clinic with reports of mild thoracic pain. Patient progressed through postural strengthening without complaint of pain. Proper technique and posture VC'd during treatment. No complaints of any increased thoracic pain during therex. Patient indicates that most pain is still with work activities such as lifting. Normal stimulation response noted following removal of the modality.    Personal Factors and Comorbidities  Time since onset of injury/illness/exacerbation;Other    Examination-Activity Limitations Reach Overhead;Carry;Lift    Examination-Participation Restrictions Occupation    Stability/Clinical Decision Making Stable/Uncomplicated    Rehab Potential Good    PT Frequency 2x / week    PT Duration 4 weeks    PT Treatment/Interventions Cryotherapy;Electrical Stimulation;ADLs/Self Care Home Management;Functional mobility training;Therapeutic activities;Therapeutic exercise;Spinal Manipulations;Passive range of motion;Patient/family education;Neuromuscular re-education;Manual techniques;Ultrasound    PT Next Visit Plan Shoulder and scapular strengthening, modalities and STW/M as needed.    Consulted and Agree with Plan of Care Patient             Patient will benefit from skilled therapeutic intervention in order to improve the following deficits and impairments:  Decreased activity tolerance, Decreased strength, Pain, Postural dysfunction  Visit Diagnosis: Pain in thoracic spine  Muscle weakness (generalized)     Problem List Patient Active Problem List   Diagnosis Date Noted   Chronic midline low back pain with left-sided sciatica 01/12/2018   BMI 29.0-29.9,adult 01/12/2018   Generalized anxiety disorder 11/04/2016    Standley Brooking, PTA 07/23/2021, 5:53 PM  [facility] [address] [place], [place], [address] Phone: [phone]   Fax:  [phone]  Name: Doriana Mazurkiewicz MRN: [id number] Date of Birth: 1995/09/15  PHYSICAL THERAPY DISCHARGE SUMMARY  Visits from Start of Care: 5.  Current functional level related to goals / functional outcomes: See above.   Remaining deficits: See below.   Education / Equipment: HEP.   Patient agrees to discharge. Patient goals were not met. Patient is being discharged due to not returning since the last visit.    Mali Applegate MPT

## 2021-07-28 ENCOUNTER — Ambulatory Visit: Payer: BC Managed Care – PPO | Admitting: *Deleted

## 2021-07-30 ENCOUNTER — Encounter: Payer: BC Managed Care – PPO | Admitting: Physical Therapy

## 2021-08-26 ENCOUNTER — Encounter: Payer: Self-pay | Admitting: Family Medicine

## 2021-08-27 ENCOUNTER — Ambulatory Visit: Payer: BC Managed Care – PPO | Admitting: Family Medicine

## 2021-08-27 ENCOUNTER — Encounter: Payer: Self-pay | Admitting: Family Medicine

## 2021-08-27 VITALS — BP 120/68 | HR 76 | Temp 98.3°F | Ht 60.0 in | Wt 151.4 lb

## 2021-08-27 DIAGNOSIS — M546 Pain in thoracic spine: Secondary | ICD-10-CM | POA: Diagnosis not present

## 2021-08-27 DIAGNOSIS — G8929 Other chronic pain: Secondary | ICD-10-CM

## 2021-08-27 DIAGNOSIS — F411 Generalized anxiety disorder: Secondary | ICD-10-CM

## 2021-08-27 DIAGNOSIS — N3020 Other chronic cystitis without hematuria: Secondary | ICD-10-CM

## 2021-08-27 DIAGNOSIS — M62838 Other muscle spasm: Secondary | ICD-10-CM | POA: Diagnosis not present

## 2021-08-27 DIAGNOSIS — F339 Major depressive disorder, recurrent, unspecified: Secondary | ICD-10-CM

## 2021-08-27 DIAGNOSIS — Q6432 Congenital stricture of urethra: Secondary | ICD-10-CM

## 2021-08-27 NOTE — Progress Notes (Signed)
Established Patient Office Visit  Subjective   Patient ID: Katrina Lin, female    DOB: Mar 12, 1995  Age: 26 y.o. MRN: [id number]  Chief Complaint  Patient presents with   Anxiety   Back Pain   HPI Katrina Lin is here to discuss her anxiety, back pain, and chronic cystitis. 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 She has concerns about her ability to drive this 3 hour route due to her mental health, muscle spasms in her back, and chronic cystitis. She reports urinary frequency and urgency due to her bladder issues. She does have a urologist- [facility]. She is due for a follow up with them. She reports that her back spasms have been worse recently but them she did a home test for her urine and started on her home regimen for Macrobid and this has significantly improved her back spasm. She has had a normal MRI and xray of her back in the past. She has completed physical therapy and this has been helpful. She takes tizanidine about once a week with improvement. Denies changes in gait, numbness, tingling, or fever. She has an appt in a few weeks with [facility] for anxiety and depression.      08/27/2021    1:30 PM 06/22/2021    4:30 PM 05/06/2021    3:31 PM  Depression screen PHQ 2/9  Decreased Interest 0 0 1  Down, Depressed, Hopeless '2 1 2  '$ PHQ - 2 Score '2 1 3  '$ Altered sleeping '2 2 2  '$ Tired, decreased energy '1 2 3  '$ Change in appetite 0 1 2  Feeling bad or failure about yourself  1 2 0  Trouble concentrating 0 3 3  Moving slowly or fidgety/restless 0 0 1  Suicidal thoughts 0 0 0  PHQ-9 Score '6 11 14  '$ Difficult doing work/chores Very difficult Somewhat difficult Somewhat difficult      08/27/2021    1:31 PM 06/22/2021    4:32 PM 05/06/2021    3:32 PM 09/17/2020    3:28 PM  GAD 7 : Generalized Anxiety Score  Nervous, Anxious, on Edge '3 3 3 3  '$ Control/stop worrying '3 3 3 3  '$ Worry too much - different things '3 3  3 3  '$ Trouble relaxing '3 3 3 3  '$ Restless '3 3 3 3  '$ Easily annoyed or irritable '3 3 3 3  '$ Afraid - awful might happen '3 3 3 3  '$ Total GAD 7 Score '21 21 21 21  '$ Anxiety Difficulty Very difficult Somewhat difficult Somewhat difficult Very difficult     Past Medical History:  Diagnosis Date   Anxiety    Panic attacks       Review of Systems  Constitutional:  Negative for chills, fever and malaise/fatigue.  HENT:  Negative for ear pain, hearing loss, nosebleeds and sinus pain.   Eyes:  Negative for blurred vision, double vision and pain.  Respiratory:  Negative for cough and shortness of breath.   Cardiovascular:  Negative for chest pain, claudication and leg swelling.  Gastrointestinal:  Negative for abdominal pain, heartburn, nausea and vomiting.  Genitourinary:  Negative for dysuria, frequency and urgency.  Musculoskeletal:  Positive for back pain. Negative for falls, joint pain and neck pain.  Skin:  Negative for itching and rash.  Neurological:  Negative for dizziness, tingling, sensory change, speech change and focal weakness.  Endo/Heme/Allergies:  Negative for polydipsia. Does not  bruise/bleed easily.  Psychiatric/Behavioral:  Positive for depression (has an appointment with Psych July 12th). The patient is nervous/anxious (has an appointment with Psych July 12).       Objective:     BP 120/68   Pulse 76   Temp 98.3 F (36.8 C) (Temporal)   Ht 5' (1.524 m)   Wt 68.7 kg   SpO2 97%   BMI 29.56 kg/m    Physical Exam Constitutional:      Appearance: Normal appearance.  HENT:     Head: Normocephalic and atraumatic.     Right Ear: Tympanic membrane, ear canal and external ear normal.     Left Ear: Tympanic membrane, ear canal and external ear normal.     Nose: Nose normal.     Mouth/Throat:     Mouth: Mucous membranes are moist.  Eyes:     Extraocular Movements: Extraocular movements intact.  Cardiovascular:     Rate and Rhythm: Normal rate and regular rhythm.      Pulses: Normal pulses.     Heart sounds: Normal heart sounds.  Pulmonary:     Effort: Pulmonary effort is normal.     Breath sounds: Normal breath sounds.  Abdominal:     General: Bowel sounds are normal.     Palpations: Abdomen is soft.  Musculoskeletal:        General: Normal range of motion.     Cervical back: Normal range of motion and neck supple.  Skin:    General: Skin is warm and dry.     Capillary Refill: Capillary refill takes less than 2 seconds.  Neurological:     Mental Status: She is alert and oriented to person, place, and time.  Psychiatric:        Mood and Affect: Mood normal.        Behavior: Behavior normal.        Thought Content: Thought content normal.        Judgment: Judgment normal.      No results found for any visits on 08/27/21.    The ASCVD Risk score (Arnett DK, et al., 2019) failed to calculate for the following reasons:   The 2019 ASCVD risk score is only valid for ages 39 to 44    Assessment & Plan:   Katrina Lin was seen today for anxiety and back pain.  Diagnoses and all orders for this visit:  Depression, recurrent ([place]) Generalized anxiety disorder Uncontrolled. Denies SI. Has upcoming appt with [facility]. 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   Chronic bilateral thoracic back pain Muscle spasm Reports well controlled with prn tizanidine and PT. Declined referral today.   Chronic cystitis Congenital stricture of urethra Managed by urology. She will follow up with them regarding her concerns with driving a long route for work.   Return in about 6 months (around 02/26/2022) for CPE.  The patient indicates understanding of these issues and agrees with the plan.  Kathlen Mody, RN, FNP student  I personally was present during the history, physical exam, and medical decision-making activities of this service and have verified that the service and findings are accurately documented in the nurse practitioner  student's note.  Marjorie Smolder, FNP

## 2021-08-28 ENCOUNTER — Ambulatory Visit: Payer: BC Managed Care – PPO | Admitting: Family Medicine

## 2021-09-16 ENCOUNTER — Encounter (HOSPITAL_COMMUNITY): Payer: Self-pay | Admitting: Psychiatry

## 2021-09-16 ENCOUNTER — Ambulatory Visit (INDEPENDENT_AMBULATORY_CARE_PROVIDER_SITE_OTHER): Payer: BC Managed Care – PPO | Admitting: Psychiatry

## 2021-09-16 DIAGNOSIS — F411 Generalized anxiety disorder: Secondary | ICD-10-CM
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
## 2021-09-30 ENCOUNTER — Ambulatory Visit (INDEPENDENT_AMBULATORY_CARE_PROVIDER_SITE_OTHER): Payer: BC Managed Care – PPO | Admitting: Psychiatry

## 2021-09-30 ENCOUNTER — Encounter (HOSPITAL_COMMUNITY): Payer: Self-pay

## 2021-09-30 DIAGNOSIS — F411 Generalized anxiety disorder: Secondary | ICD-10-CM | POA: Diagnosis not present

## 2021-09-30 NOTE — Plan of Care (Signed)
Pt participated in development of plan  Problem: Anxiety Disorder CCP excessive worry/ruminating thoughts, muscle tension, shaking sweating,  Goal: " I want to learn better coping skills to manage my anxiety" Outcome: Not Progressing Goal: LTG: Patient will score less than 5 on the Generalized Anxiety Disorder 7 Scale (GAD-7) Outcome: Not Progressing Goal: STG: Report a decrease in anxiety symptoms as evidenced by an overall reduction in anxiety score by a minimum of 25% on the Generalized Anxiety Disorder Scale Outcome: Not Progressing

## 2021-09-30 NOTE — Progress Notes (Signed)
IN- PERSON   THERAPIST PROGRESS NOTE  Session Time:  Wednesday 09/30/2021 3:05 PM -3:55 PM   Participation Level: Active  Behavioral Response: CasualAlertAnxious  Type of Therapy: Individual Therapy  Treatment Goals addressed: Patient will score less than 5 on the Generalized Anxiety Disorder 7 Scale (GAD-7)  Report a decrease in anxiety symptoms as evidenced by an overall reduction in anxiety score by a minimum of 25% on the Generalized Anxiety Disorder Scale  ProgressTowards Goals:  iniital plan - Not Progressing  Interventions: CBT and Supportive  Summary: Katrina Lin is a 26 y.o. female who is referred for services by PCP Marjorie Smolder due to pt experiencing symptoms of anxiety. She denies any psychiatric hospitalizations.She participated in counseling in [place] and last was seen about 2 years ago. She initially experienced anxiety around age 67, She states thinking this was triggered by traumatic experience with a doctor when blood was being drawn.  Per patient's report, anxiety has increased anxiety.  Stressors include concerns about current job and possibility of changing jobs.  Current symptoms include sleep difficulty, poor concentration, and ruminating thoughts.  Patient last was seen 2 weeks ago for the assessment appointment.  She reports continued symptoms of anxiety but decreased intensity and frequency.  She reports returning to work for the past 2 weeks and says this has helped.  She has been busy and has had less time to worry.  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  She is scheduled to take a test for the position but has catastrophizing thoughts regarding this.  She also expresses fear and anxiety about upcoming surgery to have wisdom tooth removed.  Suicidal/Homicidal: Nowithout intent/plan  Therapist Response: Reviewed symptoms, administered GAD-7, developed treatment plan, obtained patient's permission  to electronically signed plan for patient, discussed stressors, facilitated expression of thoughts and feelings, validated feelings, began to orient patient to CBT, used worry exploration questions to examine patient's thoughts about stressors, provided psychoeducation on anxiety and the stress response, discussed rationale for and assisted patient practice deep breathing to trigger relaxation response, develop plan with patient to practice deep breathing 5 to 10 minutes 2 times per day.  Plan: Return again in 2 weeks.  Diagnosis: Generalized anxiety disorder  Collaboration of Care: Other none needed at this time  Patient/Guardian was advised Release of Information must be obtained prior to any record release in order to collaborate their care with an outside provider. Patient/Guardian was advised if they have not already done so to contact the registration department to sign all necessary forms in order for [place] to release information regarding their care.   Consent: Patient/Guardian gives verbal consent for treatment and assignment of benefits for services provided during this visit. Patient/Guardian expressed understanding and agreed to proceed.   Alonza Smoker, LCSW 09/30/2021
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
## 2021-10-14 ENCOUNTER — Ambulatory Visit (INDEPENDENT_AMBULATORY_CARE_PROVIDER_SITE_OTHER): Payer: BC Managed Care – PPO | Admitting: Psychiatry

## 2021-10-14 DIAGNOSIS — F411 Generalized anxiety disorder: Secondary | ICD-10-CM

## 2021-10-14 NOTE — Progress Notes (Signed)
IN- PERSON   THERAPIST PROGRESS NOTE  Session Time:  Wednesday 10/14/2021 2:05 PM  - 2:55 PM               Participation Level: Active  Behavioral Response: CasualAlertAnxious  Type of Therapy: Individual Therapy  Treatment Goals addressed: Patient will score less than 5 on the Generalized Anxiety Disorder 7 Scale (GAD-7)  Report a decrease in anxiety symptoms as evidenced by an overall reduction in anxiety score by a minimum of 25% on the Generalized Anxiety Disorder Scale  ProgressTowards Goals:  Progressing   Interventions: CBT and Supportive  Summary: Katrina Lin is a 26 y.o. female who is referred for services by PCP Marjorie Smolder due to pt experiencing symptoms of anxiety. She denies any psychiatric hospitalizations.She participated in counseling in [place] and last was seen about 2 years ago. She initially experienced anxiety around age 78, She states thinking this was triggered by traumatic experience with a doctor when blood was being drawn.  Per patient's report, anxiety has increased anxiety.  Stressors include concerns about current job and possibility of changing jobs.  Current symptoms include sleep difficulty, poor concentration, and ruminating thoughts.  Patient last was seen 2 weeks ag She reports continued symptoms of anxiety but continued decreased intensity and frequency.  She has been practicing deep breathing and reports this has been helpful. Per her report, she more quickly recognizes early signs of anxiety and has been able to intervene more quickly to relax.  She reports using deep breathing, self-talk, and informing dental staff  about being nervous as coping tools when she had oral surgery. This went well per pt's report. 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 She expresses some anxiety regarding this. She reports additional stress and anxiety regarding relationship with boyfriend as they have communication issues.    Suicidal/Homicidal: Nowithout intent/plan  Therapist Response: Reviewed symptoms, praised and reinforced patient's efforts to practice deep breathing, discussed effects, congratulated pt on passing test, normalized some anxiety about starting new job, assisted patient identify ways to improve assertiveness skills in requesting help or asking questions regarding training for her new position, facilitated patient expressing thoughts and feelings regarding relationship with boyfriend, began to assist patient identify her pattern of interaction with boyfriend,  developed plan with patient to practice deep breathing 5 to 10 minutes 2 times per day.  Plan: Return again in 2 weeks.  Diagnosis: Generalized anxiety disorder  Collaboration of Care: Other none needed at this time  Patient/Guardian was advised Release of Information must be obtained prior to any record release in order to collaborate their care with an outside provider. Patient/Guardian was advised if they have not already done so to contact the registration department to sign all necessary forms in order for [place] to release information regarding their care.   Consent: Patient/Guardian gives verbal consent for treatment and assignment of benefits for services provided during this visit. Patient/Guardian expressed understanding and agreed to proceed.   Alonza Smoker, LCSW 10/14/2021

## 2021-10-28 ENCOUNTER — Ambulatory Visit (HOSPITAL_COMMUNITY): Payer: BC Managed Care – PPO | Admitting: Psychiatry

## 2021-11-06 ENCOUNTER — Ambulatory Visit: Payer: BC Managed Care – PPO | Admitting: Family Medicine

## 2021-11-06 ENCOUNTER — Encounter: Payer: Self-pay | Admitting: Family Medicine

## 2021-11-06 VITALS — BP 109/67 | HR 87 | Temp 98.1°F | Ht 60.0 in | Wt 149.2 lb

## 2021-11-06 DIAGNOSIS — J029 Acute pharyngitis, unspecified: Secondary | ICD-10-CM

## 2021-11-06 NOTE — Progress Notes (Signed)
Acute Office Visit  Subjective:     Patient ID: Katrina Lin, female    DOB: 04-18-1995, 26 y.o.   MRN: [id number]  Chief Complaint  Patient presents with   Sore Throat    Sore Throat  This is a new problem. Episode onset: 3 days ago. The problem has been gradually improving. There has been no fever. Associated symptoms include congestion, coughing and a plugged ear sensation. Pertinent negatives include no abdominal pain, diarrhea, drooling, ear discharge, ear pain, headaches, hoarse voice, neck pain, shortness of breath, stridor, swollen glands, trouble swallowing or vomiting. She has tried NSAIDs (cough syrup) for the symptoms. The treatment provided moderate relief.  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  Review of Systems  Constitutional:  Negative for chills and fever.  HENT:  Positive for congestion and sore throat. Negative for drooling, ear discharge, ear pain, hoarse voice and trouble swallowing.   Respiratory:  Positive for cough. Negative for shortness of breath and stridor.   Cardiovascular:  Negative for chest pain.  Gastrointestinal:  Positive for nausea. Negative for abdominal pain, diarrhea and vomiting.  Musculoskeletal:  Positive for myalgias. Negative for neck pain.  Neurological:  Negative for headaches.        Objective:    BP 109/67   Pulse 87   Temp 98.1 F (36.7 C) (Temporal)   Ht 5' (1.524 m)   Wt 149 lb 4 oz (67.7 kg)   SpO2 99%   BMI 29.15 kg/m    Physical Exam Vitals and nursing note reviewed.  Constitutional:      General: She is not in acute distress.    Appearance: She is not ill-appearing, toxic-appearing or diaphoretic.  HENT:     Head: Normocephalic and atraumatic.     Right Ear: Tympanic membrane and ear canal normal. No drainage.     Left Ear: Tympanic membrane and ear canal normal. No drainage.     Mouth/Throat:     Mouth: Mucous membranes are moist.     Pharynx: Posterior  oropharyngeal erythema present. No oropharyngeal exudate or uvula swelling.     Tonsils: No tonsillar exudate. 1+ on the right. 1+ on the left.  Eyes:     Extraocular Movements:     Right eye: Normal extraocular motion.     Left eye: Normal extraocular motion.     Conjunctiva/sclera: Conjunctivae normal.  Neck:     Thyroid: No thyromegaly.  Cardiovascular:     Rate and Rhythm: Normal rate and regular rhythm.     Heart sounds: Normal heart sounds. No murmur heard. Pulmonary:     Effort: Pulmonary effort is normal.     Breath sounds: Normal breath sounds.  Abdominal:     General: Bowel sounds are normal.     Palpations: Abdomen is soft.  Musculoskeletal:     Cervical back: Neck supple.  Lymphadenopathy:     Cervical: No cervical adenopathy.  Skin:    General: Skin is warm and dry.  Neurological:     General: No focal deficit present.     Mental Status: She is alert and oriented to person, place, and time.  Psychiatric:        Mood and Affect: Mood normal.        Behavior: Behavior normal.     No results found for any visits on 11/06/21.      Assessment & Plan:   Katrina Lin was seen today for sore throat.  Diagnoses and all orders for this visit:  Sore throat Testing pending as below. Discussed symptomatic care and return precautions. Discussed quarantine recommendations.  -     COVID-19, Flu A+B and RSV  Return to office for new or worsening symptoms, or if symptoms persist.   The patient indicates understanding of these issues and agrees with the plan.  Gwenlyn Perking, FNP

## 2021-11-07 LAB — COVID-19, FLU A+B AND RSV
Influenza A, NAA: NOT DETECTED
Influenza B, NAA: NOT DETECTED
RSV, NAA: NOT DETECTED
SARS-CoV-2, NAA: NOT DETECTED

## 2021-11-18 ENCOUNTER — Ambulatory Visit (INDEPENDENT_AMBULATORY_CARE_PROVIDER_SITE_OTHER): Payer: BC Managed Care – PPO | Admitting: Psychiatry

## 2021-11-18 DIAGNOSIS — F411 Generalized anxiety disorder: Secondary | ICD-10-CM | POA: Diagnosis not present

## 2021-11-18 NOTE — Progress Notes (Signed)
IN- PERSON   THERAPIST PROGRESS NOTE  Session Time:  Wednesday 11/18/2021 4:10 PM - 5:03 PM                Participation Level: Active  Behavioral Response: CasualAlertAnxious  Type of Therapy: Individual Therapy  Treatment Goals addressed: Patient will score less than 5 on the Generalized Anxiety Disorder 7 Scale (GAD-7)  Report a decrease in anxiety symptoms as evidenced by an overall reduction in anxiety score by a minimum of 25% on the Generalized Anxiety Disorder Scale  ProgressTowards Goals:  Progressing   Interventions: CBT and Supportive  Summary: Taima Rada is a 26 y.o. female who is referred for services by PCP Marjorie Smolder due to pt experiencing symptoms of anxiety. She denies any psychiatric hospitalizations.She participated in counseling in [place] and last was seen about 2 years ago. She initially experienced anxiety around age 79, She states thinking this was triggered by traumatic experience with a doctor when blood was being drawn.  Per patient's report, anxiety has increased anxiety.  Stressors include concerns about current job and possibility of changing jobs.  Current symptoms include sleep difficulty, poor concentration, and ruminating thoughts.  Patient last was seen 4 weeks ago.  She reports increased stress and continued symptoms of anxiety but continued decreased intensity and frequency.  She has been using deep breathing as an intervention.  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  Patient also is having to assist in other areas/positions at the school.  She also reports continued stress regarding relationship with her boyfriend.  She is contemplating discontinuing residing with her boyfriend when her lease expires  next April.    Suicidal/Homicidal: Nowithout intent/plan  Therapist Response: Reviewed symptoms, praised and reinforced patient's efforts to practice deep breathing, discussed effects, discussed rationale for practicing deep breathing regularly, assisted patient problem solving possible ways to have a break during her workday, assisted patient identify ways to improve assertiveness skills and asking for help in order to have a break, facilitated patient expressing thoughts and feelings regarding relationship with boyfriend, discussed rationale for and assisted patient practice aggressive muscle relaxation to relieve anxiety and stress, developed plan with patient to practice a relaxation technique daily, provided patient with interactive activity and access code to assist her in her efforts  Plan: Return again in 2 weeks.  Diagnosis: Generalized anxiety disorder  Collaboration of Care: Other none needed at this time  Patient/Guardian was advised Release of Information must be obtained prior to any record release in order to collaborate their care with an outside provider. Patient/Guardian was advised if they have not already done so to contact the registration department to sign all necessary forms in order for [place] to release information regarding their care.   Consent: Patient/Guardian gives verbal consent for treatment and assignment of benefits for services provided during this visit. Patient/Guardian expressed understanding and agreed to proceed.   Alonza Smoker, LCSW 11/18/2021

## 2021-12-02 ENCOUNTER — Ambulatory Visit (HOSPITAL_COMMUNITY): Payer: BC Managed Care – PPO | Admitting: Psychiatry

## 2021-12-24 ENCOUNTER — Ambulatory Visit (INDEPENDENT_AMBULATORY_CARE_PROVIDER_SITE_OTHER): Payer: BC Managed Care – PPO | Admitting: Psychiatry

## 2021-12-24 DIAGNOSIS — F411 Generalized anxiety disorder: Secondary | ICD-10-CM

## 2021-12-24 NOTE — Progress Notes (Signed)
Virtual Visit via Telephone Note  I connected with Katrina Lin on 12/24/21 at 4:08 PM EDT  by telephone and verified that I am speaking with the correct person using two identifiers.  Location: Patient: Home Provider:  [place] office    I discussed the limitations, risks, security and privacy concerns of performing an evaluation and management service by telephone and the availability of in person appointments. I also discussed with the patient that there may be a patient responsible charge related to this service. The patient expressed understanding and agreed to proceed.    I provided 47 minutes of non-face-to-face time during this encounter.   Alonza Smoker, LCSW  IN- PERSON   THERAPIST PROGRESS NOTE  Session Time:  Thursday 12/24/2021 4:08 PM - 4:55 PM              Participation Level: Active  Behavioral Response: CasualAlertAnxious  Type of Therapy: Individual Therapy  Treatment Goals addressed: Patient will score less than 5 on the Generalized Anxiety Disorder 7 Scale (GAD-7)  Report a decrease in anxiety symptoms as evidenced by an overall reduction in anxiety score by a minimum of 25% on the Generalized Anxiety Disorder Scale  ProgressTowards Goals:  Progressing   Interventions: CBT and Supportive  Summary: Katrina Lin is a 26 y.o. female who is referred for services by PCP Marjorie Smolder due to pt experiencing symptoms of anxiety. She denies any psychiatric hospitalizations.She participated in counseling in [place] and last was seen about 2 years ago. She initially experienced anxiety around age 44, She states thinking this was triggered by traumatic experience with a doctor when blood was being drawn.  Per patient's report, anxiety has increased anxiety.  Stressors include concerns about current job and possibility of changing jobs.  Current symptoms include sleep difficulty, poor concentration, and ruminating thoughts.  Patient last was  seen 5 weeks ago via virtual visit.  She reports decreased stress and anxiety regarding her job as she is feeling more comfortable in her position.  She also has had successful efforts in a break for self during her workday.  She reports increased stress regarding the relationship with her boyfriend.  Per patient's report, she and her daughter have been staying with patient's parents for the past week rather than residing in the apartment with patient's boyfriend.  Patient continues to contemplate ending the relationship as boyfriend and girlfriend but wants to remain friends.  She expresses anxiety about how to go about this as she states wanting to do the right thing and not wanting to leave the responsibility of the apartment lease to her boyfriend.  Patient reports she has been practicing deep breathing and says this has been helpful in coping with the stress and anxiety.  She reports practicing PMR few times.  Suicidal/Homicidal: Nowithout intent/plan  Therapist Response: Reviewed symptoms, praised and reinforced patient's efforts to schedule breaks for self,, discussed effects, encouraged patient to remain consistent with her efforts to schedule breaks, discussed stressors, facilitated expression of thoughts and feelings, validated feelings, assisted patient identify realistic expectations of self regarding relationship with boyfriend, discussed rationale for and assisted patient practice a beach visualization as another relaxation technique, developed plan with patient to practice visualization between sessions, checked out interactive audio activity to patient and provided with access code  Plan: Return again in 2 weeks.  Diagnosis: Generalized anxiety disorder  Collaboration of Care: Other none needed at this time  Patient/Guardian was advised Release of Information must be obtained prior  to any record release in order to collaborate their care with an outside provider. Patient/Guardian was  advised if they have not already done so to contact the registration department to sign all necessary forms in order for [place] to release information regarding their care.   Consent: Patient/Guardian gives verbal consent for treatment and assignment of benefits for services provided during this visit. Patient/Guardian expressed understanding and agreed to proceed.   Alonza Smoker, LCSW 12/24/2021

## 2022-01-07 ENCOUNTER — Ambulatory Visit (HOSPITAL_COMMUNITY): Payer: BC Managed Care – PPO | Admitting: Psychiatry

## 2022-01-07 DIAGNOSIS — F411 Generalized anxiety disorder: Secondary | ICD-10-CM
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
## 2022-01-21 ENCOUNTER — Ambulatory Visit (HOSPITAL_COMMUNITY): Payer: BC Managed Care – PPO | Admitting: Psychiatry

## 2022-01-21 DIAGNOSIS — F411 Generalized anxiety disorder: Secondary | ICD-10-CM | POA: Diagnosis not present

## 2022-01-21 NOTE — Progress Notes (Signed)
IN- PERSON   THERAPIST PROGRESS NOTE  Session Time:  Thursday 01/21/2022 4:02 PM - 4:55 PM    Participation Level: Active  Behavioral Response: CasualAlertAnxious/fidgety  Type of Therapy: Individual Therapy  Treatment Goals addressed: Patient will score less than 5 on the Generalized Anxiety Disorder 7 Scale (GAD-7)  Report a decrease in anxiety symptoms as evidenced by an overall reduction in anxiety score by a minimum of 25% on the Generalized Anxiety Disorder Scale  ProgressTowards Goals:  Progressing   Interventions: CBT and Supportive  Summary: Georgine Wiltse is a 26 y.o. female who is referred for services by PCP Marjorie Smolder due to pt experiencing symptoms of anxiety. She denies any psychiatric hospitalizations.She participated in counseling in [place] and last was seen about 2 years ago. She initially experienced anxiety around age 26, She states thinking this was triggered by traumatic experience with a doctor when blood was being drawn.  Per patient's report, anxiety has increased anxiety.  Stressors include concerns about current job and possibility of changing jobs.  Current symptoms include sleep difficulty, poor concentration, and ruminating thoughts.  Patient last was seen 2-3 weeks ago via virtual visit.  She continues to experience symptoms of anxiety as reflected in the GAD-7.  She continues to experience muscle tension and nervousness.  Per her report, she has experienced at least 3 panic attacks since last session.  She reports panic attacks normally occur at night as she sometimes wakes up in a panic.  However, she reports having a really bad panic attack out of the blue after eating out with her sister.  Initially she has difficulty identifying possible triggers but eventually discusses stress about daughter having visitations with her father as a possible trigger.  Patient's report, she frequently worries about daughter's welfare when with father as father does  not provide the same type of supervision and care patient does.  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  She also fears her job status may change depending upon how the conflict between teacher and parent is resolved.  Patient reports less stress about relationship with boyfriend she states she knows she has options.  She expresses continued frustration with him but is planning to move back in to their apartment this weekend.   Patient reports she has been using deep breathing and self talk to try to cope.  Patient reports considering medication in the past and expresses desire for medication evaluation to help address anxiety.  Suicidal/Homicidal: Nowithout intent/plan  Therapist Response: Reviewed symptoms, minister GAD-7, discussed results, praised and reinforced patient's efforts to use helpful coping strategies to manage stress and anxiety/panic attacks, assisted patient try to identify triggers of panic attacks, continued to provide psychoeducation, discussed rationale for and assisted patient practice progressive muscle relaxation, developed plan with patient to practice PMR daily, checked out interactive audio activity to patient and provided with access code, discussed referral to psychiatrist Dr. Nehemiah Settle for medication evaluation               Plan: Return again in 2 weeks.  Diagnosis: Generalized anxiety disorder  Collaboration of Care: Psychiatrist AEB therapist will see in referral to psychiatrist Dr. Nehemiah Settle for medication evaluation.  Patient/Guardian was advised Release of Information must be obtained prior to any record release in order to collaborate their care with an outside provider. Patient/Guardian was advised if they have not  already done so to contact the registration department to sign all necessary forms in order for [place] to release  information regarding their care.   Consent: Patient/Guardian gives verbal consent for treatment and assignment of benefits for services provided during this visit. Patient/Guardian expressed understanding and agreed to proceed.   Alonza Smoker, LCSW 01/21/2022

## 2022-02-25 ENCOUNTER — Ambulatory Visit (HOSPITAL_COMMUNITY): Payer: BC Managed Care – PPO | Admitting: Psychiatry

## 2022-02-25 DIAGNOSIS — F411 Generalized anxiety disorder: Secondary | ICD-10-CM
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
## 2022-04-01 ENCOUNTER — Ambulatory Visit (INDEPENDENT_AMBULATORY_CARE_PROVIDER_SITE_OTHER): Payer: BC Managed Care – PPO | Admitting: Psychiatry

## 2022-04-01 ENCOUNTER — Encounter (HOSPITAL_COMMUNITY): Payer: Self-pay | Admitting: Psychiatry

## 2022-04-01 DIAGNOSIS — F410 Panic disorder [episodic paroxysmal anxiety] without agoraphobia: Secondary | ICD-10-CM | POA: Diagnosis not present

## 2022-04-01 DIAGNOSIS — F411 Generalized anxiety disorder: Secondary | ICD-10-CM

## 2022-04-01 NOTE — Patient Instructions (Signed)
We did not add any medications to her regimen today but if you would like to pursue one in the future feel free to reach out to our office we will be happy to do so.
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
## 2022-04-15 ENCOUNTER — Ambulatory Visit (HOSPITAL_COMMUNITY): Payer: BC Managed Care – PPO | Admitting: Psychiatry

## 2022-04-15 ENCOUNTER — Encounter: Payer: Self-pay | Admitting: Family Medicine

## 2022-04-15 ENCOUNTER — Ambulatory Visit: Payer: BC Managed Care – PPO | Admitting: Family Medicine

## 2022-04-15 VITALS — BP 121/84 | HR 79 | Ht 60.0 in | Wt 148.0 lb

## 2022-04-15 DIAGNOSIS — J029 Acute pharyngitis, unspecified: Secondary | ICD-10-CM | POA: Diagnosis not present

## 2022-04-15 DIAGNOSIS — R051 Acute cough: Secondary | ICD-10-CM | POA: Diagnosis not present

## 2022-04-15 LAB — CULTURE, GROUP A STREP

## 2022-04-15 LAB — RAPID STREP SCREEN (MED CTR MEBANE ONLY): Strep Gp A Ag, IA W/Reflex: NEGATIVE

## 2022-04-15 MED ORDER — AMOXICILLIN 500 MG PO CAPS: 500.0000 mg | ORAL_CAPSULE | Freq: Two times a day (BID) | ORAL | 0 refills | Status: DC

## 2022-04-15 NOTE — Progress Notes (Signed)
BP 121/84   Pulse 79   Ht 5' (1.524 m)   Wt 148 lb (67.1 kg)   SpO2 100%   BMI 28.90 kg/m    Subjective:   Patient ID: Katrina Lin, female    DOB: 14-Jun-1995, 27 y.o.   MRN: [id number]  HPI: Katrina Lin is a 27 y.o. female presenting on 04/15/2022 for No chief complaint on file.   HPI Patient comes in today complaining of sore throat and congestion and a mild cough.  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  She denies any fevers or chills.  Relevant past medical, surgical, family and social history reviewed and updated as indicated. Interim medical history since our last visit reviewed. Allergies and medications reviewed and updated.  Review of Systems  Constitutional:  Negative for chills and fever.  HENT:  Positive for congestion, postnasal drip, rhinorrhea and sore throat. Negative for ear discharge, ear pain, sinus pressure and sneezing.   Eyes:  Negative for visual disturbance.  Respiratory:  Positive for cough. Negative for chest tightness and shortness of breath.   Cardiovascular:  Negative for chest pain and leg swelling.  Musculoskeletal:  Negative for back pain and gait problem.  Skin:  Negative for rash.  Neurological:  Negative for light-headedness and headaches.  Psychiatric/Behavioral:  Negative for agitation and behavioral problems.   All other systems reviewed and are negative.   Per HPI unless specifically indicated above   Allergies as of 04/15/2022       Reactions   Oyster Shell    Positive skin allergy test 02/06/20.    Shrimp Extract Allergy Skin Test         Medication List        Accurate as of April 15, 2022  4:17 PM. If you have any questions, ask your nurse or doctor.          amoxicillin 500 MG  capsule Commonly known as: AMOXIL Take 1 capsule (500 mg total) by mouth 2 (two) times daily. Started by: Worthy Rancher, MD   folic acid 1 MG tablet Commonly known as: FOLVITE Take 1 mg by mouth daily.   naproxen 375 MG tablet Commonly known as: NAPROSYN Take 1 tablet (375 mg total) by mouth 2 (two) times daily.   nitrofurantoin (macrocrystal-monohydrate) 100 MG capsule Commonly known as: MACROBID Take 100 mg by mouth 2 (two) times daily.   norethindrone-ethinyl estradiol 1-20 MG-MCG tablet Commonly known as: LOESTRIN Take 1 tablet by mouth daily.   VITAMIN B-12 PO Take by mouth.   VITAMIN D3 PO Take by mouth.         Objective:   BP 121/84   Pulse 79   Ht 5' (1.524 m)   Wt 148 lb (67.1 kg)   SpO2 100%   BMI 28.90 kg/m   Wt Readings from Last 3 Encounters:  04/15/22 148 lb (67.1 kg)  11/06/21 149 lb 4 oz (67.7 kg)  08/27/21 151 lb 6 oz (68.7 kg)    Physical Exam Vitals and nursing note reviewed.  Constitutional:      General: She is not in acute distress.    Appearance: She is well-developed. She is not diaphoretic.  HENT:     Right Ear: Tympanic membrane, ear canal and  external ear normal.     Left Ear: Tympanic membrane, ear canal and external ear normal.     Nose: Mucosal edema and rhinorrhea present.     Right Sinus: No maxillary sinus tenderness or frontal sinus tenderness.     Left Sinus: No maxillary sinus tenderness or frontal sinus tenderness.     Mouth/Throat:     Pharynx: Uvula midline. Posterior oropharyngeal erythema present. No oropharyngeal exudate.     Tonsils: No tonsillar abscesses.  Eyes:     Conjunctiva/sclera: Conjunctivae normal.  Cardiovascular:     Rate and Rhythm: Normal rate and regular rhythm.     Heart sounds: Normal heart sounds. No murmur heard. Pulmonary:     Effort: Pulmonary effort is normal. No respiratory distress.     Breath sounds: Normal breath sounds. No wheezing or rhonchi.  Musculoskeletal:         General: No tenderness. Normal range of motion.  Skin:    General: Skin is warm and dry.     Findings: No rash.  Neurological:     Mental Status: She is alert and oriented to person, place, and time.     Coordination: Coordination normal.  Psychiatric:        Behavior: Behavior normal.       Assessment & Plan:   Problem List Items Addressed This Visit   None Visit Diagnoses     Sore throat    -  Primary   Relevant Orders   Rapid Strep Screen ([facility] ONLY)   Acute cough       Relevant Orders   Rapid Strep Screen ([facility] ONLY)     Rapid strep negative  Recommended the Mucinex and Flonase and then if anything worsens she can take the amoxicillin.  Follow up plan: Return if symptoms worsen or fail to improve.  Counseling provided for all of the vaccine components Orders Placed This Encounter  Procedures   Rapid Strep Screen ([facility] ONLY)    Caryl Pina, MD [facility] 04/15/2022, 4:17 PM

## 2022-04-27 ENCOUNTER — Encounter: Payer: Self-pay | Admitting: Nurse Practitioner

## 2022-04-27 ENCOUNTER — Ambulatory Visit: Payer: BC Managed Care – PPO | Admitting: Nurse Practitioner

## 2022-04-27 VITALS — BP 123/79 | HR 66 | Temp 98.3°F | Resp 20 | Ht 60.0 in | Wt 147.0 lb

## 2022-04-27 DIAGNOSIS — R300 Dysuria: Secondary | ICD-10-CM

## 2022-04-27 DIAGNOSIS — M545 Low back pain, unspecified: Secondary | ICD-10-CM | POA: Diagnosis not present

## 2022-04-27 LAB — MICROSCOPIC EXAMINATION
RBC, Urine: NONE SEEN /hpf (ref 0–2)
Renal Epithel, UA: NONE SEEN /hpf

## 2022-04-27 LAB — URINALYSIS, COMPLETE
Bilirubin, UA: NEGATIVE
Glucose, UA: NEGATIVE
Nitrite, UA: NEGATIVE
Protein,UA: NEGATIVE
Specific Gravity, UA: 1.025 (ref 1.005–1.030)
Urobilinogen, Ur: 1 mg/dL (ref 0.2–1.0)
pH, UA: 6.5 (ref 5.0–7.5)

## 2022-04-27 NOTE — Patient Instructions (Signed)
Acute Back Pain, Adult Acute back pain is sudden and usually short-lived. It is often caused by an injury to the muscles and tissues in the back. The injury may result from: A muscle, tendon, or ligament getting overstretched or torn. Ligaments are tissues that connect bones to each other. Lifting something improperly can cause a back strain. Wear and tear (degeneration) of the spinal disks. Spinal disks are circular tissue that provide cushioning between the bones of the spine (vertebrae). Twisting motions, such as while playing sports or doing yard work. A hit to the back. Arthritis. You may have a physical exam, lab tests, and imaging tests to find the cause of your pain. Acute back pain usually goes away with rest and home care. Follow these instructions at home: Managing pain, stiffness, and swelling Take over-the-counter and prescription medicines only as told by your health care provider. Treatment may include medicines for pain and inflammation that are taken by mouth or applied to the skin, or muscle relaxants. Your health care provider may recommend applying ice during the first 24-48 hours after your pain starts. To do this: Put ice in a plastic bag. Place a towel between your skin and the bag. Leave the ice on for 20 minutes, 2-3 times a day. Remove the ice if your skin turns bright red. This is very important. If you cannot feel pain, heat, or cold, you have a greater risk of damage to the area. If directed, apply heat to the affected area as often as told by your health care provider. Use the heat source that your health care provider recommends, such as a moist heat pack or a heating pad. Place a towel between your skin and the heat source. Leave the heat on for 20-30 minutes. Remove the heat if your skin turns bright red. This is especially important if you are unable to feel pain, heat, or cold. You have a greater risk of getting burned. Activity  Do not stay in bed. Staying in  bed for more than 1-2 days can delay your recovery. Sit up and stand up straight. Avoid leaning forward when you sit or hunching over when you stand. If you work at a desk, sit close to it so you do not need to lean over. Keep your chin tucked in. Keep your neck drawn back, and keep your elbows bent at a 90-degree angle (right angle). Sit high and close to the steering wheel when you drive. Add lower back (lumbar) support to your car seat, if needed. Take short walks on even surfaces as soon as you are able. Try to increase the length of time you walk each day. Do not sit, drive, or stand in one place for more than 30 minutes at a time. Sitting or standing for long periods of time can put stress on your back. Do not drive or use heavy machinery while taking prescription pain medicine. Use proper lifting techniques. When you bend and lift, use positions that put less stress on your back: Bend your knees. Keep the load close to your body. Avoid twisting. Exercise regularly as told by your health care provider. Exercising helps your back heal faster and helps prevent back injuries by keeping muscles strong and flexible. Work with a physical therapist to make a safe exercise program, as recommended by your health care provider. Do any exercises as told by your physical therapist. Lifestyle Maintain a healthy weight. Extra weight puts stress on your back and makes it difficult to have good   posture. Avoid activities or situations that make you feel anxious or stressed. Stress and anxiety increase muscle tension and can make back pain worse. Learn ways to manage anxiety and stress, such as through exercise. General instructions Sleep on a firm mattress in a comfortable position. Try lying on your side with your knees slightly bent. If you lie on your back, put a pillow under your knees. Keep your head and neck in a straight line with your spine (neutral position) when using electronic equipment like  smartphones or pads. To do this: Raise your smartphone or pad to look at it instead of bending your head or neck to look down. Put the smartphone or pad at the level of your face while looking at the screen. Follow your treatment plan as told by your health care provider. This may include: Cognitive or behavioral therapy. Acupuncture or massage therapy. Meditation or yoga. Contact a health care provider if: You have pain that is not relieved with rest or medicine. You have increasing pain going down into your legs or buttocks. Your pain does not improve after 2 weeks. You have pain at night. You lose weight without trying. You have a fever or chills. You develop nausea or vomiting. You develop abdominal pain. Get help right away if: You develop new bowel or bladder control problems. You have unusual weakness or numbness in your arms or legs. You feel faint. These symptoms may represent a serious problem that is an emergency. Do not wait to see if the symptoms will go away. Get medical help right away. Call your local emergency services (911 in the [place] Do not drive yourself to the hospital. Summary Acute back pain is sudden and usually short-lived. Use proper lifting techniques. When you bend and lift, use positions that put less stress on your back. Take over-the-counter and prescription medicines only as told by your health care provider, and apply heat or ice as told. This information is not intended to replace advice given to you by your health care provider. Make sure you discuss any questions you have with your health care provider. Document Revised: 05/16/2020 Document Reviewed: 05/16/2020 Elsevier Patient Education  2023 [employer]  

## 2022-04-27 NOTE — Progress Notes (Signed)
   Subjective:    Patient ID: Katrina Lin, female    DOB: 28-May-1995, 28 y.o.   MRN: [id number]   Chief Complaint: Back Pain and Urinary Frequency   Urinary Frequency  This is a new problem. The problem occurs every urination. The problem has been waxing and waning. The quality of the pain is described as aching and burning. The pain is at a severity of 10/10. The pain is moderate. There has been no fever. She is Sexually active. There is No history of pyelonephritis. Associated symptoms include frequency, hesitancy and urgency. Pertinent negatives include no chills. She has tried nothing for the symptoms. The treatment provided mild relief.    Patient Active Problem List   Diagnosis Date Noted   Panic disorder 04/01/2022   Congenital stricture of urethra 08/27/2021   Chronic cystitis 08/27/2021   Muscle spasm 08/27/2021   Chronic midline low back pain with left-sided sciatica 01/12/2018   BMI 29.0-29.9,adult 01/12/2018   Generalized anxiety disorder 11/04/2016       Review of Systems  Constitutional:  Negative for chills.  Genitourinary:  Positive for frequency, hesitancy and urgency.  Musculoskeletal:  Positive for back pain.       Objective:   Physical Exam Constitutional:      Appearance: Normal appearance.  Cardiovascular:     Rate and Rhythm: Normal rate and regular rhythm.     Heart sounds: Normal heart sounds.  Pulmonary:     Effort: Pulmonary effort is normal.     Breath sounds: Normal breath sounds.  Skin:    General: Skin is warm.  Neurological:     General: No focal deficit present.     Mental Status: She is alert and oriented to person, place, and time.  Psychiatric:        Mood and Affect: Mood normal.        Behavior: Behavior normal.    BP 123/79   Pulse 66   Temp 98.3 F (36.8 C) (Temporal)   Resp 20   Ht 5' (1.524 m)   Wt 147 lb (66.7 kg)   SpO2 97%   BMI 28.71 kg/m   Urine clear       Assessment & Plan:   Katrina Lin  in today with chief complaint of Back Pain and Urinary Frequency   1. Dysuria Force fluids - Urinalysis, Complete - Urine Culture  2. Acute midline low back pain without sciatica Moist heat Rest Naprosyn as prescribed RTO prn    The above assessment and management plan was discussed with the patient. The patient verbalized understanding of and has agreed to the management plan. Patient is aware to call the clinic if symptoms persist or worsen. Patient is aware when to return to the clinic for a follow-up visit. Patient educated on when it is appropriate to go to the emergency department.   Mary-Margaret Hassell Done, FNP

## 2022-04-29 ENCOUNTER — Ambulatory Visit (HOSPITAL_COMMUNITY): Payer: BC Managed Care – PPO | Admitting: Psychiatry

## 2022-04-29 DIAGNOSIS — F411 Generalized anxiety disorder: Secondary | ICD-10-CM

## 2022-04-29 LAB — URINE CULTURE

## 2022-04-29 NOTE — Addendum Note (Signed)
Addended by: Maurice Small E on: 04/29/2022 05:09 PM   Modules accepted: Level of Service
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
## 2022-05-13 ENCOUNTER — Ambulatory Visit (HOSPITAL_COMMUNITY): Payer: BC Managed Care – PPO | Admitting: Psychiatry

## 2022-05-28 ENCOUNTER — Encounter: Payer: BC Managed Care – PPO | Admitting: Family Medicine

## 2022-05-28 DIAGNOSIS — Z91199 Patient's noncompliance with other medical treatment and regimen due to unspecified reason: Secondary | ICD-10-CM

## 2022-05-28 NOTE — Progress Notes (Signed)
No show

## 2022-07-28 ENCOUNTER — Ambulatory Visit: Payer: BC Managed Care – PPO | Admitting: Family Medicine

## 2022-08-18 ENCOUNTER — Encounter: Payer: Self-pay | Admitting: Family Medicine

## 2022-08-18 ENCOUNTER — Ambulatory Visit: Payer: BC Managed Care – PPO | Admitting: Family Medicine

## 2022-08-18 ENCOUNTER — Other Ambulatory Visit (INDEPENDENT_AMBULATORY_CARE_PROVIDER_SITE_OTHER): Payer: BC Managed Care – PPO

## 2022-08-18 VITALS — BP 128/81 | HR 88 | Temp 97.8°F | Ht 61.0 in | Wt 141.0 lb

## 2022-08-18 DIAGNOSIS — R0683 Snoring: Secondary | ICD-10-CM

## 2022-08-18 DIAGNOSIS — R002 Palpitations: Secondary | ICD-10-CM | POA: Diagnosis not present

## 2022-08-18 DIAGNOSIS — G4720 Circadian rhythm sleep disorder, unspecified type: Secondary | ICD-10-CM

## 2022-08-18 DIAGNOSIS — F410 Panic disorder [episodic paroxysmal anxiety] without agoraphobia: Secondary | ICD-10-CM

## 2022-08-18 NOTE — Progress Notes (Signed)
Acute Office Visit  Subjective:     Patient ID: Katrina Lin, female    DOB: [birth year], 27 y.o.   MRN: [id number]  Chief Complaint  Patient presents with   Follow-up    Referral for sleep study    HPI Patient is in today for a referral for a sleep study. She reports that she has woken up during the night suddenly with a racing heart, shortness of breath, and anxiety. This has been happening since she was a teenager and has become more frequent to wear it happens nightly for the last few months. She does snore as well. Her mother has OSA and recommended that she request a referral for a sleep study. She generally feels well rested when she wakes up. She denies daytime somnolence.   She has a history of anxiety and panic attacks. She reports that her panic attacks only occur at night. She is follow up with [facility]. They have discussed medication but she is still trying to decide if she is interested in medication.      08/18/2022   11:36 AM 04/27/2022    4:01 PM 04/15/2022    4:04 PM  Depression screen PHQ 2/9  Decreased Interest 0 0 1  Down, Depressed, Hopeless 2 1 1   PHQ - 2 Score 2 1 2   Altered sleeping 3 2 3   Tired, decreased energy 0 1 1  Change in appetite 0 0 2  Feeling bad or failure about yourself  0 0 0  Trouble concentrating 1 1 1   Moving slowly or fidgety/restless  0 0  Suicidal thoughts 0 0 0  PHQ-9 Score 6 5 9   Difficult doing work/chores Somewhat difficult Somewhat difficult Not difficult at all      08/18/2022   11:37 AM 04/29/2022    4:14 PM 04/27/2022    4:02 PM 04/15/2022    4:05 PM  GAD 7 : Generalized Anxiety Score  Nervous, Anxious, on Edge 3 2 3 2   Control/stop worrying 3 1 3 2   Worry too much - different things 3 1 3 2   Trouble relaxing 3 2 3 1   Restless 3 0 3 0  Easily annoyed or irritable 3 1 3 1   Afraid - awful might happen 3 0 3 0  Total GAD 7 Score 21 7 21 8   Anxiety Difficulty Somewhat difficult  Not difficult at all Not difficult at all      ROS As per HPI.      Objective:    BP 128/81   Pulse 88   Temp 97.8 F (36.6 C) (Oral)   Ht 5\' 1"  (1.549 m)   Wt 141 lb (64 kg)   LMP 04/26/2022 (Approximate)   SpO2 97%   BMI 26.64 kg/m    Physical Exam Vitals and nursing note reviewed.  Constitutional:      General: She is not in acute distress.    Appearance: Normal appearance. She is not ill-appearing.  Cardiovascular:     Rate and Rhythm: Normal rate and regular rhythm.     Pulses: Normal pulses.     Heart sounds: Normal heart sounds. No murmur heard. Pulmonary:     Effort: Pulmonary effort is normal. No respiratory distress.     Breath sounds: Normal breath sounds.  Abdominal:     General: Bowel sounds are normal. There is no distension.     Palpations: Abdomen is soft. There is no mass.     Tenderness: There is no abdominal tenderness. There  is no guarding or rebound.  Musculoskeletal:     Cervical back: Neck supple. No tenderness.     Right lower leg: No edema.     Left lower leg: No edema.  Lymphadenopathy:     Cervical: No cervical adenopathy.  Skin:    General: Skin is warm and dry.  Neurological:     General: No focal deficit present.     Mental Status: She is alert and oriented to person, place, and time.  Psychiatric:        Mood and Affect: Mood normal.        Behavior: Behavior normal.     No results found for any visits on 08/18/22.      Assessment & Plan:   Kanyon was seen today for follow-up.  Diagnoses and all orders for this visit:  Disturbed sleep rhythm Snoring Referral placed as below. Discussed symptoms are most consist with panic attacks.  -     Ambulatory referral to Sleep Studies  Palpitations Discussed likely due to panic attacks but will order zio to assess for any potential arrhthymias. Will notify her of results when available and plan of care pending results.  -     LONG TERM MONITOR (3-14 DAYS); Future  Panic attack Uncontrolled. She is unsure about  medications at this time. Recommended follow up with [facility].  Return in about 6 months (around 02/17/2023) for CPE. Sooner for new or worsening symptoms.   The patient indicates understanding of these issues and agrees with the plan.  Gabriel Earing, FNP

## 2022-12-06 ENCOUNTER — Encounter: Payer: Self-pay | Admitting: Family Medicine

## 2022-12-06 ENCOUNTER — Ambulatory Visit: Payer: BC Managed Care – PPO | Admitting: Family Medicine

## 2022-12-06 ENCOUNTER — Other Ambulatory Visit: Payer: Self-pay

## 2022-12-06 VITALS — BP 130/60 | HR 93 | Temp 98.7°F | Resp 16 | Wt 137.1 lb

## 2022-12-06 DIAGNOSIS — T7800XD Anaphylactic reaction due to unspecified food, subsequent encounter: Secondary | ICD-10-CM

## 2022-12-06 DIAGNOSIS — T7800XA Anaphylactic reaction due to unspecified food, initial encounter: Secondary | ICD-10-CM | POA: Insufficient documentation

## 2022-12-06 DIAGNOSIS — J310 Chronic rhinitis: Secondary | ICD-10-CM | POA: Insufficient documentation

## 2022-12-06 MED ORDER — EPINEPHRINE 0.3 MG/0.3ML IJ SOAJ: 0.3000 mg | INTRAMUSCULAR | 1 refills | Status: DC | PRN

## 2022-12-06 NOTE — Patient Instructions (Addendum)
Chronic rhinitis Moderately well controlled Continue an antihistamine once a day as needed for runny nose or itch. Some examples of over the counter antihistamines include Zyrtec (cetirizine), Xyzal (levocetirizine), Allegra (fexofenadine), and Claritin (loratidine).  Consider saline nasal rinses as needed for nasal symptoms. Use this before any medicated nasal sprays for best result Consider updating your environmental allergies  Food allergy Stable with avoidance Continue to avoid fish, shellfish, and mollusks. In case of an allergic reaction, take Benadryl 50 mg every 4 hours, and if life-threatening symptoms occur, inject with AuviQ 0.3 mg. Consider updating your food allergy testing. Remember to stop antihistamines for 3 days before the testing appointment  Call the clinic if this treatment plan is not working well for you.  Follow up in 1 year or sooner if needed.

## 2022-12-06 NOTE — Progress Notes (Signed)
[address] [place] [address] Dept: [phone]  FOLLOW UP NOTE  Patient ID: Katrina Lin, female    DOB: 1996/01/11  Age: 27 y.o. MRN: [id number] Date of Office Visit: 12/06/2022  Assessment  Chief Complaint: Follow-up and Medication Refill  HPI Katrina Lin is a 27 year old female who presents to the clinic for a follow-up visit.  She was last seen in this clinic on 05/15/2020 for a successful in office oral crab challenge with Dr. Delorse Lek.  Prior to that visit she was seen in this clinic on 02/06/2020 as a new patient by Dr. Delorse Lek for evaluation of allergic rhinitis and food allergy to fish, shellfish, and mollusks.    At today's visit, she reports her chronic rhinitis has been moderately well controlled with nasal congestion and clear rhinorrhea as the main symptoms. She reports that she recently has gotten a new dog and her symptoms have slightly increased. She continues Claritin as needed and is not currently using a nasal saline rinse or steroid nasal spray. She has not had any environmental allergy testing.   She continues to avoid all shellfish, all finned fish, and all mollusks. Her last skin prick testing was on 02/06/2020 and was slightly positive to oyster and negative to other selected foods including fish and shellfish.  Lab work on 02/06/2020 indicated shrimp IgE 1.19 and scallop  IgE 0.18. She passed an in office food challenge to crab, however, reports that her anxiety has prohibited her from consuming fish, shellfish, or mollusks. She denies any accidental ingestion of seafood or epinephrine since her last visit to this clinic. Epinephrine auto-injectors are out of date and will be reordered at today's visit.   Her current medications are listed in the chart.    Drug Allergies:  Allergies  Allergen Reactions   Oyster Shell     Positive skin allergy test 02/06/20.    Shrimp Extract     Physical Exam: BP 130/60   Pulse 93   Temp 98.7 F (37.1 C)  (Temporal)   Resp 16   Wt 137 lb 1.6 oz (62.2 kg)   SpO2 99%   BMI 25.90 kg/m    Physical Exam Vitals reviewed.  Constitutional:      Appearance: Normal appearance.  HENT:     Head: Normocephalic and atraumatic.     Right Ear: Tympanic membrane normal.     Left Ear: Tympanic membrane normal.     Nose:     Comments: Bilateral nares normal. Pharynx normal. Ears normal. Eyes normal.    Mouth/Throat:     Pharynx: Oropharynx is clear.  Eyes:     Conjunctiva/sclera: Conjunctivae normal.  Cardiovascular:     Rate and Rhythm: Normal rate and regular rhythm.     Heart sounds: Normal heart sounds. No murmur heard. Pulmonary:     Effort: Pulmonary effort is normal.     Breath sounds: Normal breath sounds.     Comments: Lungs clear to auscultation Musculoskeletal:        General: Normal range of motion.     Cervical back: Normal range of motion and neck supple.  Skin:    General: Skin is warm and dry.  Neurological:     Mental Status: She is alert and oriented to person, place, and time.  Psychiatric:        Mood and Affect: Mood normal.        Behavior: Behavior normal.        Thought Content: Thought content normal.  Judgment: Judgment normal.     Assessment and Plan: 1. Chronic rhinitis   2. Allergy with anaphylaxis due to food     Meds ordered this encounter  Medications   EPINEPHrine (AUVI-Q) 0.3 mg/0.3 mL IJ SOAJ injection    Sig: Inject 0.3 mg into the muscle as needed for anaphylaxis.    Dispense:  1 each    Refill:  1    Patient Instructions  Chronic rhinitis Moderately well controlled Continue an antihistamine once a day as needed for runny nose or itch. Some examples of over the counter antihistamines include Zyrtec (cetirizine), Xyzal (levocetirizine), Allegra (fexofenadine), and Claritin (loratidine).  Consider saline nasal rinses as needed for nasal symptoms. Use this before any medicated nasal sprays for best result Consider updating your  environmental allergies  Food allergy Stable with avoidance Continue to avoid fish, shellfish, and mollusks. In case of an allergic reaction, take Benadryl 50 mg every 4 hours, and if life-threatening symptoms occur, inject with AuviQ 0.3 mg. Consider updating your food allergy testing. Remember to stop antihistamines for 3 days before the testing appointment  Call the clinic if this treatment plan is not working well for you.  Follow up in 1 year or sooner if needed.   Return in about 1 year (around 12/06/2023), or if symptoms worsen or fail to improve.    Thank you for the opportunity to care for this patient.  Please do not hesitate to contact me with questions.  Thermon Leyland, FNP [facility]

## 2023-02-07 ENCOUNTER — Encounter: Payer: Self-pay | Admitting: Family Medicine

## 2023-02-07 NOTE — Telephone Encounter (Unsigned)
Copied from CRM [id number]. Topic: Clinical - Medical Advice >> Feb 07, 2023  8:23 AM Katrina Lin wrote: Reason for CRM: Patient states she has a UTI and would like a prescription asap.  She could not schedule an appointment as the next available is Dec 16th and she feels she needs to be seen today.

## 2023-02-17 ENCOUNTER — Ambulatory Visit: Payer: BC Managed Care – PPO | Admitting: Family Medicine

## 2023-02-17 ENCOUNTER — Encounter: Payer: Self-pay | Admitting: Family Medicine

## 2023-02-17 VITALS — BP 121/80 | HR 85 | Temp 98.2°F | Ht 61.0 in | Wt 132.2 lb

## 2023-02-17 DIAGNOSIS — Z0001 Encounter for general adult medical examination with abnormal findings: Secondary | ICD-10-CM | POA: Diagnosis not present

## 2023-02-17 DIAGNOSIS — G8929 Other chronic pain: Secondary | ICD-10-CM | POA: Insufficient documentation

## 2023-02-17 DIAGNOSIS — Z1329 Encounter for screening for other suspected endocrine disorder: Secondary | ICD-10-CM | POA: Diagnosis not present

## 2023-02-17 DIAGNOSIS — Z130 Encounter for screening for diseases of the blood and blood-forming organs and certain disorders involving the immune mechanism: Secondary | ICD-10-CM

## 2023-02-17 DIAGNOSIS — N760 Acute vaginitis: Secondary | ICD-10-CM | POA: Diagnosis not present

## 2023-02-17 DIAGNOSIS — R300 Dysuria: Secondary | ICD-10-CM

## 2023-02-17 DIAGNOSIS — M546 Pain in thoracic spine: Secondary | ICD-10-CM | POA: Diagnosis not present

## 2023-02-17 DIAGNOSIS — F339 Major depressive disorder, recurrent, unspecified: Secondary | ICD-10-CM

## 2023-02-17 DIAGNOSIS — F411 Generalized anxiety disorder: Secondary | ICD-10-CM

## 2023-02-17 DIAGNOSIS — Z13220 Encounter for screening for lipoid disorders: Secondary | ICD-10-CM | POA: Diagnosis not present

## 2023-02-17 DIAGNOSIS — Z13228 Encounter for screening for other metabolic disorders: Secondary | ICD-10-CM

## 2023-02-17 DIAGNOSIS — Z0000 Encounter for general adult medical examination without abnormal findings: Secondary | ICD-10-CM

## 2023-02-17 LAB — URINALYSIS, ROUTINE W REFLEX MICROSCOPIC
Bilirubin, UA: NEGATIVE
Glucose, UA: NEGATIVE
Ketones, UA: NEGATIVE
Leukocytes,UA: NEGATIVE
Nitrite, UA: NEGATIVE
Protein,UA: NEGATIVE
Specific Gravity, UA: >1.03 — ABNORMAL HIGH (ref 1.005–1.030)
Urobilinogen, Ur: 0.2 mg/dL (ref 0.2–1.0)
pH, UA: 5.5 (ref 5.0–7.5)

## 2023-02-17 LAB — MICROSCOPIC EXAMINATION
Renal Epithel, UA: NONE SEEN /[HPF]
Yeast, UA: NONE SEEN

## 2023-02-17 LAB — WET PREP FOR TRICH, YEAST, CLUE
Clue Cell Exam: NEGATIVE
Trichomonas Exam: NEGATIVE
Yeast Exam: NEGATIVE

## 2023-02-17 MED ORDER — NAPROXEN 375 MG PO TABS: 375.0000 mg | ORAL_TABLET | Freq: Two times a day (BID) | ORAL | 5 refills | Status: DC

## 2023-02-17 NOTE — Progress Notes (Signed)
Complete physical exam  Patient: Katrina Lin   DOB: 12/17/1995   27 y.o. Female  MRN: [id number]  Subjective:    Chief Complaint  Patient presents with   Annual Exam    Katrina Lin is a 27 y.o. female who presents today for a complete physical exam. She reports consuming a  well balanced  diet. Home exercise routine includes walking, strength training. She generally feels well. She reports sleeping fairly well. She does have additional problems to discuss today.   Just completed macrobid from UC for UTI. Symptoms improved but she now has burning of vulva after urination. White, thick vaginal discharge. Strong odor to urine.   She would like a refill of naproxen that she takes no back pain. Symptoms have been intermittent, stable. No red flags.   She has established with psychiatry. She was prescribed hydroxyzine but has not yet started this.   Most recent fall risk assessment:    02/17/2023    2:46 PM  Fall Risk   Falls in the past year? 0     Most recent depression screenings:    02/17/2023    2:47 PM 08/18/2022   11:36 AM 04/27/2022    4:01 PM  Depression screen PHQ 2/9  Decreased Interest 1 0 0  Down, Depressed, Hopeless 1 2 1   PHQ - 2 Score 2 2 1   Altered sleeping 3 3 2   Tired, decreased energy 1 0 1  Change in appetite 1 0 0  Feeling bad or failure about yourself  0 0 0  Trouble concentrating 0 1 1  Moving slowly or fidgety/restless 0  0  Suicidal thoughts 0 0 0  PHQ-9 Score 7 6 5   Difficult doing work/chores Somewhat difficult Somewhat difficult Somewhat difficult      02/17/2023    2:46 PM 08/18/2022   11:37 AM 04/29/2022    4:14 PM 04/27/2022    4:02 PM  GAD 7 : Generalized Anxiety Score  Nervous, Anxious, on Edge 3 3  3   Control/stop worrying 3 3  3   Worry too much - different things 3 3  3   Trouble relaxing 3 3  3   Restless 3 3  3   Easily annoyed or irritable 3 3  3   Afraid - awful might happen 3 3  3   Total GAD 7 Score 21 21  21    Anxiety Difficulty Very difficult Somewhat difficult  Not difficult at all     Information is confidential and restricted. Go to Review Flowsheets to unlock data.          Patient Care Team: Gabriel Earing, FNP as PCP - General (Family Medicine)   Outpatient Medications Prior to Visit  Medication Sig   Cholecalciferol (VITAMIN D3 PO) Take by mouth.   Cyanocobalamin (VITAMIN B-12 PO) Take by mouth.   EPINEPHrine (AUVI-Q) 0.3 mg/0.3 mL IJ SOAJ injection Inject 0.3 mg into the muscle as needed for anaphylaxis.   folic acid (FOLVITE) 1 MG tablet Take 1 mg by mouth daily.   hydrOXYzine (ATARAX) 25 MG tablet Take 12.5-25 mg by mouth daily as needed.   norethindrone-ethinyl estradiol (LOESTRIN) 1-20 MG-MCG tablet Take 1 tablet by mouth daily.   naproxen (NAPROSYN) 375 MG tablet Take 1 tablet (375 mg total) by mouth 2 (two) times daily. (Patient not taking: Reported on 02/17/2023)   No facility-administered medications prior to visit.    ROS Negative unless specially indicated above in HPI.     Objective:  BP 121/80   Pulse 85   Temp 98.2 F (36.8 C) (Temporal)   Ht 5\' 1"  (1.549 m)   Wt 132 lb 4 oz (60 kg)   SpO2 100%   BMI 24.99 kg/m    Physical Exam Vitals and nursing note reviewed.  Constitutional:      General: She is not in acute distress.    Appearance: Normal appearance. She is not ill-appearing.  HENT:     Head: Normocephalic.     Right Ear: Tympanic membrane, ear canal and external ear normal.     Left Ear: Tympanic membrane, ear canal and external ear normal.     Nose: Nose normal.     Mouth/Throat:     Mouth: Mucous membranes are dry.     Pharynx: Oropharynx is clear.  Eyes:     Extraocular Movements: Extraocular movements intact.     Conjunctiva/sclera: Conjunctivae normal.     Pupils: Pupils are equal, round, and reactive to light.  Cardiovascular:     Rate and Rhythm: Normal rate and regular rhythm.     Pulses: Normal pulses.     Heart  sounds: Normal heart sounds. No murmur heard.    No friction rub. No gallop.  Pulmonary:     Effort: Pulmonary effort is normal.     Breath sounds: Normal breath sounds.  Abdominal:     General: Bowel sounds are normal. There is no distension.     Palpations: Abdomen is soft. There is no mass.     Tenderness: There is no abdominal tenderness. There is no guarding.  Musculoskeletal:        General: No swelling or tenderness. Normal range of motion.     Cervical back: Normal range of motion and neck supple. No tenderness.     Right lower leg: No edema.     Left lower leg: No edema.  Skin:    General: Skin is warm and dry.     Capillary Refill: Capillary refill takes less than 2 seconds.     Findings: No lesion or rash.  Neurological:     General: No focal deficit present.     Mental Status: She is alert and oriented to person, place, and time.     Cranial Nerves: No cranial nerve deficit.     Motor: No weakness.     Gait: Gait normal.  Psychiatric:        Mood and Affect: Mood normal.        Behavior: Behavior normal.        Thought Content: Thought content normal.        Judgment: Judgment normal.      Urine dipstick shows positive for RBC's.  Micro exam: 0-5 WBC's per HPF, 0-2 RBC's per HPF, and few+ bacteria.  Microscopic wet-mount exam shows negative for pathogens, normal epithelial cells.   No results found for any visits on 02/17/23.     Assessment & Plan:    Routine Health Maintenance and Physical Exam  Katrina Lin was seen today for annual exam.  Diagnoses and all orders for this visit:  Routine general medical examination at a health care facility  Screening for endocrine, metabolic and immunity disorder -     CBC with Differential/Platelet; Future -     CMP14+EGFR; Future -     TSH; Future  Encounter for screening for lipid disorder She will return for fasting labs.  -     Lipid panel; Future  Acute vaginitis Dysuria Negative wet  prep. Urine is  concentrated, increase hydration. UA not compelling for UTI. Culture pending.  -     Urinalysis, Routine w reflex microscopic -     WET PREP FOR TRICH, YEAST, CLUE -     Urine Culture  Chronic bilateral thoracic back pain Well controlled on current regimen.  -     naproxen (NAPROSYN) 375 MG tablet; Take 1 tablet (375 mg total) by mouth 2 (two) times daily.  Depression, recurrent (HCC) Generalized anxiety disorder Now established with [facility]. Has not yet started medication. Uncontrolled but denies SI. Recommend starting hydroxyzine as prescribed.    Immunization History  Administered Date(s) Administered   Tdap 03/08/2013    Health Maintenance  Topic Date Due   Cervical Cancer Screening (Pap smear)  03/14/2022   INFLUENZA VACCINE  06/06/2023 (Originally 10/07/2022)   Hepatitis C Screening  02/17/2024 (Originally 12/30/2012)   HIV Screening  02/17/2024 (Originally 12/30/2009)   COVID-19 Vaccine (1) 03/04/2024 (Originally 12/31/1999)   DTaP/Tdap/Td (2 - Td or Tdap) 03/09/2023   HPV VACCINES  Aged Out    Discussed health benefits of physical activity, and encouraged her to engage in regular exercise appropriate for her age and condition.  Problem List Items Addressed This Visit   None Visit Diagnoses       Acute vaginitis    -  Primary   Relevant Orders   Urinalysis, Routine w reflex microscopic   WET PREP FOR TRICH, YEAST, CLUE     Dysuria       Relevant Orders   Urinalysis, Routine w reflex microscopic   WET PREP FOR TRICH, YEAST, CLUE      Return in 1 year (on 02/17/2024).   The patient indicates understanding of these issues and agrees with the plan.  Gabriel Earing, FNP

## 2023-02-17 NOTE — Patient Instructions (Signed)

## 2023-02-18 LAB — URINE CULTURE

## 2023-03-04 ENCOUNTER — Other Ambulatory Visit: Payer: BC Managed Care – PPO

## 2023-03-07 ENCOUNTER — Other Ambulatory Visit: Payer: BC Managed Care – PPO

## 2023-03-07 DIAGNOSIS — Z130 Encounter for screening for diseases of the blood and blood-forming organs and certain disorders involving the immune mechanism: Secondary | ICD-10-CM

## 2023-03-07 DIAGNOSIS — Z13220 Encounter for screening for lipoid disorders: Secondary | ICD-10-CM

## 2023-03-07 LAB — LIPID PANEL

## 2023-03-08 LAB — CMP14+EGFR
ALT: 15 IU/L (ref 0–32)
AST: 14 IU/L (ref 0–40)
Albumin: 4.4 g/dL (ref 4.0–5.0)
Alkaline Phosphatase: 69 IU/L (ref 44–121)
BUN/Creatinine Ratio: 14 (ref 9–23)
BUN: 12 mg/dL (ref 6–20)
Bilirubin Total: 0.5 mg/dL (ref 0.0–1.2)
CO2: 18 mmol/L — ABNORMAL LOW (ref 20–29)
Calcium: 9.7 mg/dL (ref 8.7–10.2)
Chloride: 105 mmol/L (ref 96–106)
Creatinine, Ser: 0.86 mg/dL (ref 0.57–1.00)
Globulin, Total: 3.1 g/dL (ref 1.5–4.5)
Glucose: 84 mg/dL (ref 70–99)
Potassium: 3.7 mmol/L (ref 3.5–5.2)
Sodium: 140 mmol/L (ref 134–144)
Total Protein: 7.5 g/dL (ref 6.0–8.5)
eGFR: 94 mL/min/{1.73_m2} (ref >59)

## 2023-03-08 LAB — CBC WITH DIFFERENTIAL/PLATELET
Basophils Absolute: 0 10*3/uL (ref 0.0–0.2)
Basos: 0 %
EOS (ABSOLUTE): 0.1 10*3/uL (ref 0.0–0.4)
Eos: 2 %
Hematocrit: 41.2 % (ref 34.0–46.6)
Hemoglobin: 13.5 g/dL (ref 11.1–15.9)
Immature Grans (Abs): 0 10*3/uL (ref 0.0–0.1)
Immature Granulocytes: 0 %
Lymphocytes Absolute: 2.6 10*3/uL (ref 0.7–3.1)
Lymphs: 37 %
MCH: 30.6 pg (ref 26.6–33.0)
MCHC: 32.8 g/dL (ref 31.5–35.7)
MCV: 93 fL (ref 79–97)
Monocytes Absolute: 0.6 10*3/uL (ref 0.1–0.9)
Monocytes: 8 %
Neutrophils Absolute: 3.6 10*3/uL (ref 1.4–7.0)
Neutrophils: 53 %
Platelets: 315 10*3/uL (ref 150–450)
RBC: 4.41 x10E6/uL (ref 3.77–5.28)
RDW: 12.5 % (ref 11.7–15.4)
WBC: 6.9 10*3/uL (ref 3.4–10.8)

## 2023-03-08 LAB — LIPID PANEL
Cholesterol, Total: 243 mg/dL — ABNORMAL HIGH (ref 100–199)
HDL: 62 mg/dL (ref >39)
LDL CALC COMMENT:: 3.9 ratio (ref 0.0–4.4)
LDL Chol Calc (NIH): 154 mg/dL — ABNORMAL HIGH (ref 0–99)
Triglycerides: 152 mg/dL — ABNORMAL HIGH (ref 0–149)
VLDL Cholesterol Cal: 27 mg/dL (ref 5–40)

## 2023-03-08 LAB — TSH: TSH: 1.47 u[IU]/mL (ref 0.450–4.500)

## 2023-04-08 ENCOUNTER — Ambulatory Visit: Payer: 59 | Admitting: Family Medicine

## 2023-04-08 ENCOUNTER — Encounter: Payer: Self-pay | Admitting: Family Medicine

## 2023-04-08 VITALS — BP 117/76 | HR 75 | Temp 98.3°F | Ht 61.0 in | Wt 131.0 lb

## 2023-04-08 DIAGNOSIS — M546 Pain in thoracic spine: Secondary | ICD-10-CM | POA: Diagnosis not present

## 2023-04-08 DIAGNOSIS — G8929 Other chronic pain: Secondary | ICD-10-CM | POA: Diagnosis not present

## 2023-04-08 DIAGNOSIS — Z87440 Personal history of urinary (tract) infections: Secondary | ICD-10-CM | POA: Diagnosis not present

## 2023-04-08 LAB — MICROSCOPIC EXAMINATION
Bacteria, UA: NONE SEEN
Renal Epithel, UA: NONE SEEN /[HPF]
WBC, UA: NONE SEEN /[HPF] (ref 0–5)
Yeast, UA: NONE SEEN

## 2023-04-08 LAB — URINALYSIS, ROUTINE W REFLEX MICROSCOPIC
Bilirubin, UA: NEGATIVE
Glucose, UA: NEGATIVE
Leukocytes,UA: NEGATIVE
Nitrite, UA: NEGATIVE
Protein,UA: NEGATIVE
RBC, UA: NEGATIVE
Specific Gravity, UA: >1.03 — ABNORMAL HIGH (ref 1.005–1.030)
Urobilinogen, Ur: 0.2 mg/dL (ref 0.2–1.0)
pH, UA: 5.5 (ref 5.0–7.5)

## 2023-04-08 MED ORDER — METHOCARBAMOL 500 MG PO TABS: 500.0000 mg | ORAL_TABLET | Freq: Four times a day (QID) | ORAL | 3 refills | Status: DC

## 2023-04-08 NOTE — Progress Notes (Signed)
   Acute Office Visit  Subjective:     Patient ID: Katrina Lin, female    DOB: [birth year], 28 y.o.   MRN: [id number]  Chief Complaint  Patient presents with  . Flank Pain  . Back Pain    Back Pain  Patient is in today for chronic thoracic back pain. This has been ongoing of years. Increase symptoms of the last few weeks. Pain has been daily. Bilateral. Feels tight like a spasm. Worse with activity. Denies numbness, tingling, changes in bowel or bladder control, saddle anesthesia. She has completed PT in the past which good relief and is interested in a referral for this ago. She has tried tylenol, advil, stretching without improvement.   She would like to have her urine checked today. Hx of recurrent UTIs. She has had similar back pain in the past with a UTI. No urinary symptoms.   Review of Systems  Musculoskeletal:  Positive for back pain.   As per HPI.      Objective:    BP 117/76   Pulse 75   Temp 98.3 F (36.8 C) (Temporal)   Ht 5\' 1"  (1.549 m)   Wt 131 lb (59.4 kg)   SpO2 99%   BMI 24.75 kg/m    Physical Exam Vitals and nursing note reviewed.  Constitutional:      General: She is not in acute distress.    Appearance: She is not ill-appearing, toxic-appearing or diaphoretic.  Pulmonary:     Effort: Pulmonary effort is normal. No respiratory distress.  Abdominal:     Tenderness: There is no right CVA tenderness or left CVA tenderness.  Musculoskeletal:     Thoracic back: Normal.     Lumbar back: Normal.     Right lower leg: No edema.     Left lower leg: No edema.  Skin:    General: Skin is warm.  Neurological:     Mental Status: She is alert and oriented to person, place, and time.     Motor: No weakness.     Gait: Gait normal.  Psychiatric:        Mood and Affect: Mood normal.        Behavior: Behavior normal.        Thought Content: Thought content normal.    No results found for any visits on 04/08/23.      Assessment & Plan:   Katrina Lin  was seen today for back pain.  Diagnoses and all orders for this visit:  Chronic bilateral thoracic back pain Not well controlled. No red flags. Robaxin prn. Referral to PT.  -     methocarbamol (ROBAXIN) 500 MG tablet; Take 1 tablet (500 mg total) by mouth 4 (four) times daily. -     Ambulatory referral to Physical Therapy  History of recurrent UTIs Negative UA today. Culture pending.  -     Urinalysis, Routine w reflex microscopic -     Urine Culture  Return to office for new or worsening symptoms, or if symptoms persist.   The patient indicates understanding of these issues and agrees with the plan.  Gabriel Earing, FNP

## 2023-04-09 LAB — URINE CULTURE

## 2023-04-11 ENCOUNTER — Encounter: Payer: Self-pay | Admitting: Family Medicine

## 2023-04-18 ENCOUNTER — Encounter: Payer: Self-pay | Admitting: Family Medicine

## 2023-04-18 ENCOUNTER — Ambulatory Visit: Payer: 59 | Admitting: Family Medicine

## 2023-04-18 VITALS — BP 113/75 | HR 86 | Temp 98.2°F | Ht 61.0 in | Wt 131.8 lb

## 2023-04-18 DIAGNOSIS — H6693 Otitis media, unspecified, bilateral: Secondary | ICD-10-CM | POA: Diagnosis not present

## 2023-04-18 MED ORDER — AMOXICILLIN-POT CLAVULANATE 875-125 MG PO TABS: 1.0000 | ORAL_TABLET | Freq: Two times a day (BID) | ORAL | 0 refills | Status: AC

## 2023-04-18 NOTE — Progress Notes (Signed)
   Acute Office Visit  Subjective:     Patient ID: Katrina Lin, female    DOB: 12-Feb-1996, 28 y.o.   MRN: [id number]  Chief Complaint  Patient presents with   Otalgia   Nasal Congestion    Otalgia  There is pain in both ears. This is a new problem. Episode onset: 1 weeks. The problem has been unchanged. There has been no fever. Pertinent negatives include no abdominal pain, coughing, diarrhea, ear discharge, headaches, sore throat or vomiting. Treatments tried: antihistamine. The treatment provided no relief.     Review of Systems  HENT:  Positive for ear pain. Negative for ear discharge and sore throat.   Respiratory:  Negative for cough.   Gastrointestinal:  Negative for abdominal pain, diarrhea and vomiting.  Neurological:  Negative for headaches.        Objective:    BP 113/75   Pulse 86   Temp 98.2 F (36.8 C)   Ht 5\' 1"  (1.549 m)   Wt 131 lb 12.8 oz (59.8 kg)   SpO2 98%   BMI 24.90 kg/m    Physical Exam Vitals and nursing note reviewed.  Constitutional:      General: She is not in acute distress.    Appearance: She is not ill-appearing, toxic-appearing or diaphoretic.  HENT:     Right Ear: Ear canal and external ear normal. A middle ear effusion is present. Tympanic membrane is erythematous. Tympanic membrane is not bulging.     Left Ear: Ear canal and external ear normal. A middle ear effusion is present. Tympanic membrane is erythematous. Tympanic membrane is not bulging.     Nose: Congestion present.     Mouth/Throat:     Mouth: Mucous membranes are moist.     Pharynx: Oropharynx is clear. No oropharyngeal exudate or posterior oropharyngeal erythema.  Eyes:     General:        Right eye: No discharge.        Left eye: No discharge.     Conjunctiva/sclera: Conjunctivae normal.  Cardiovascular:     Rate and Rhythm: Normal rate and regular rhythm.     Heart sounds: Normal heart sounds. No murmur heard. Pulmonary:     Effort: Pulmonary effort is  normal.     Breath sounds: Normal breath sounds.  Musculoskeletal:     Cervical back: Neck supple. No rigidity.  Skin:    General: Skin is warm and dry.  Neurological:     General: No focal deficit present.     Mental Status: She is alert and oriented to person, place, and time.  Psychiatric:        Mood and Affect: Mood normal.        Behavior: Behavior normal.     No results found for any visits on 04/18/23.      Assessment & Plan:   Sandria was seen today for otalgia and nasal congestion.  Diagnoses and all orders for this visit:  Bilateral acute otitis media Augmentin  as below. Discussed symptomatic care and return precautions.  -     amoxicillin -clavulanate (AUGMENTIN ) 875-125 MG tablet; Take 1 tablet by mouth 2 (two) times daily for 7 days.  The patient indicates understanding of these issues and agrees with the plan.  Albertha Huger, FNP

## 2023-04-19 ENCOUNTER — Encounter: Payer: Self-pay | Admitting: Family Medicine

## 2023-04-20 NOTE — Telephone Encounter (Signed)
Copied from CRM [id number]. Topic: Clinical - Medical Advice >> Apr 20, 2023  4:17 PM Gery Pray wrote: Reason for CRM: Patient was seen on Monday 02/10. Patient would like to know if there ear drop or something without Penicillin in it to take for her ears. Patient was prescribed Amoxicillin. Patient that has been prescribed this before but had never taken, she had always denied it due to her allergies

## 2023-04-28 ENCOUNTER — Encounter: Payer: Self-pay | Admitting: Family Medicine

## 2023-04-29 ENCOUNTER — Ambulatory Visit (INDEPENDENT_AMBULATORY_CARE_PROVIDER_SITE_OTHER): Payer: 59 | Admitting: Family Medicine

## 2023-04-29 ENCOUNTER — Encounter: Payer: Self-pay | Admitting: Family Medicine

## 2023-04-29 VITALS — BP 111/70 | HR 75 | Temp 98.3°F | Ht 61.0 in | Wt 133.6 lb

## 2023-04-29 DIAGNOSIS — N760 Acute vaginitis: Secondary | ICD-10-CM

## 2023-04-29 LAB — URINALYSIS, ROUTINE W REFLEX MICROSCOPIC
Bilirubin, UA: NEGATIVE
Glucose, UA: NEGATIVE
Leukocytes,UA: NEGATIVE
Nitrite, UA: NEGATIVE
RBC, UA: NEGATIVE
Specific Gravity, UA: 1.025 (ref 1.005–1.030)
Urobilinogen, Ur: 1 mg/dL (ref 0.2–1.0)
pH, UA: 7.5 (ref 5.0–7.5)

## 2023-04-29 LAB — MICROSCOPIC EXAMINATION
RBC, Urine: NONE SEEN /[HPF] (ref 0–2)
Renal Epithel, UA: NONE SEEN /[HPF]

## 2023-04-29 LAB — WET PREP FOR TRICH, YEAST, CLUE
Clue Cell Exam: NEGATIVE
Trichomonas Exam: NEGATIVE
Yeast Exam: NEGATIVE

## 2023-04-29 MED ORDER — FLUCONAZOLE 150 MG PO TABS: 150.0000 mg | ORAL_TABLET | Freq: Once | ORAL | 0 refills | Status: AC

## 2023-04-29 NOTE — Progress Notes (Signed)
   Acute Office Visit  Subjective:     Patient ID: Katrina Lin, female    DOB: 07-22-1995, 28 y.o.   MRN: [id number]  Chief Complaint  Patient presents with   Vaginitis   Flank Pain    Vaginal Discharge The patient's primary symptoms include genital itching and vaginal discharge. The patient's pertinent negatives include no genital lesions, genital rash, pelvic pain or vaginal bleeding. Primary symptoms comment: irritation. This is a new problem. Episode onset: 2 days. The problem has been gradually worsening. The pain is mild. The problem affects both sides. Associated symptoms include back pain (chronic). Pertinent negatives include no anorexia, chills, discolored urine, dysuria, fever, frequency, hematuria, nausea or vomiting. The vaginal discharge was white (clumpy). There has been no bleeding. She has tried nothing for the symptoms.   Recently on abx.   Review of Systems  Constitutional:  Negative for chills and fever.  Gastrointestinal:  Negative for anorexia, nausea and vomiting.  Genitourinary:  Positive for vaginal discharge. Negative for dysuria, frequency, hematuria and pelvic pain.  Musculoskeletal:  Positive for back pain (chronic).        Objective:    BP 111/70   Pulse 75   Temp 98.3 F (36.8 C) (Temporal)   Ht 5\' 1"  (1.549 m)   Wt 133 lb 9.6 oz (60.6 kg)   SpO2 98%   BMI 25.24 kg/m    Physical Exam Vitals and nursing note reviewed.  Constitutional:      General: She is not in acute distress.    Appearance: Normal appearance. She is not ill-appearing, toxic-appearing or diaphoretic.  Musculoskeletal:     Right lower leg: No edema.     Left lower leg: No edema.  Skin:    General: Skin is warm and dry.  Neurological:     General: No focal deficit present.     Mental Status: She is alert and oriented to person, place, and time.  Psychiatric:        Mood and Affect: Mood normal.        Behavior: Behavior normal.     Urine dipstick shows  positive for protein, positive for urobilinogen, and positive for ketones.  Micro exam: 0-5 WBC's per HPF and few + bacteria.   Microscopic wet-mount exam shows negative for pathogens, normal epithelial cells.     Assessment & Plan:   Katrina Lin was seen today for vaginitis and flank pain.  Diagnoses and all orders for this visit:  Acute vaginitis Negative wet prep. Will treat empirically with diflucan given symptoms and recent abx use. UA not compelling for UTI. Culture pending. Return to office for new or worsening symptoms, or if symptoms persist.  -     WET PREP FOR TRICH, YEAST, CLUE -     Urinalysis, Routine w reflex microscopic -     fluconazole (DIFLUCAN) 150 MG tablet; Take 1 tablet (150 mg total) by mouth once for 1 dose. Repeat in 3 days if symptoms persist. -     Urine Culture  The patient indicates understanding of these issues and agrees with the plan.  Gabriel Earing, FNP

## 2023-04-30 LAB — URINE CULTURE

## 2023-05-02 ENCOUNTER — Encounter: Payer: Self-pay | Admitting: Family Medicine

## 2023-05-03 ENCOUNTER — Ambulatory Visit: Payer: 59 | Attending: Family Medicine | Admitting: Physical Therapy

## 2023-05-03 ENCOUNTER — Encounter: Payer: Self-pay | Admitting: Physical Therapy

## 2023-05-03 ENCOUNTER — Other Ambulatory Visit: Payer: Self-pay

## 2023-05-03 DIAGNOSIS — G8929 Other chronic pain: Secondary | ICD-10-CM | POA: Diagnosis not present

## 2023-05-03 DIAGNOSIS — M6281 Muscle weakness (generalized): Secondary | ICD-10-CM | POA: Insufficient documentation

## 2023-05-03 DIAGNOSIS — R293 Abnormal posture: Secondary | ICD-10-CM | POA: Insufficient documentation

## 2023-05-03 DIAGNOSIS — M546 Pain in thoracic spine: Secondary | ICD-10-CM | POA: Insufficient documentation

## 2023-05-03 NOTE — Therapy (Signed)
 OUTPATIENT PHYSICAL THERAPY THORACOLUMBAR EVALUATION   Patient Name: Katrina Lin MRN: [id number] DOB:02/27/1996, 28 y.o., female Today's Date: 05/03/2023  END OF SESSION:  PT End of Session - 05/03/23 1541     Visit Number 1    Number of Visits 12    Date for PT Re-Evaluation 06/14/23    PT Start Time 0318    PT Stop Time 0407    PT Time Calculation (min) 49 min    Activity Tolerance Patient tolerated treatment well    Behavior During Therapy [facility] for tasks assessed/performed             Past Medical History:  Diagnosis Date   Anxiety    Chronic back pain 08/18/2015   Panic attacks    Past Surgical History:  Procedure Laterality Date   ADENOIDECTOMY     TONSILLECTOMY     Patient Active Problem List   Diagnosis Date Noted   Chronic bilateral thoracic back pain 02/17/2023   Depression, recurrent (HCC) 02/17/2023   Chronic rhinitis 12/06/2022   Allergy with anaphylaxis due to food 12/06/2022   Panic disorder 04/01/2022   Congenital stricture of urethra 08/27/2021   Chronic cystitis 08/27/2021   Muscle spasm 08/27/2021   Chronic midline low back pain with left-sided sciatica 01/12/2018   BMI 29.0-29.9,adult 01/12/2018   Generalized anxiety disorder 11/04/2016    REFERRING PROVIDER: Harlow Mares  REFERRING DIAG: Chronic bilateral thoracic back pain.  Rationale for Evaluation and Treatment: Rehabilitation  THERAPY DIAG:  Pain in thoracic spine  Muscle weakness (generalized)  Abnormal posture  ONSET DATE: ~7 years.  SUBJECTIVE:                                                                                                                                                                                           SUBJECTIVE STATEMENT: The patient presents to the clinic with c/o chronic mid-back pain. She began to experience pain after the birth of her child.  Lifting and bending increases her pain.  Lying down decreases pain.  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    PERTINENT HISTORY:  See above.  PAIN:  Are you having pain? Yes: NPRS scale: 7-8/10. Pain location: Mid-thoracic. Pain description: Ache, spasms.   Aggravating factors: As above. Relieving factors: As above.  PRECAUTIONS: None  RED FLAGS: None   WEIGHT BEARING RESTRICTIONS: No  FALLS:  Has patient fallen in last 6 months? No  LIVING ENVIRONMENT: Lives in: House/apartment Has following equipment at home:  None.  OCCUPATION: As above.    PLOF: Independent  PATIENT  GOALS: Do more with less pain.     OBJECTIVE:  Note: Objective measures were completed at Evaluation unless otherwise noted.  PATIENT SURVEYS:  Modified Oswestry 12/50.   POSTURE: rounded shoulders and forward head  PALPATION: Patient's CC of pain is bilateral thoracic pain from ~T6 to T8.  UE ROM::    Normal active bilateral UE range of motion.      UPPER EXTREMITY MMT:    Bilateral scapular retraction strength is 4 to 4+/5.  DTR's:  Normal UE DTR's.    TREATMENT DATE: 05/03/23:   HMP and IFC at 80-150 Hz on 40% scan x 20 minutes.   Normal modality response following removal of modality.                                                                                                                               PATIENT EDUCATION:  Education details:  Person educated:  International aid/development worker:  Education comprehension:   HOME EXERCISE PROGRAM:   ASSESSMENT:  CLINICAL IMPRESSION: The patient presents to OPPT with c/o chronic thoracic pain.  Her CC of pain today is bilaterally at T6 to T8.  She exhibits some scapular musculature weakness.  Her UE DTR's are normal.  Her Modified Oswestry score is 12/50.  Patient will benefit from skilled physical therapy intervention to address pain and deficits.  OBJECTIVE IMPAIRMENTS: decreased activity tolerance, decreased strength, increased muscle spasms, postural dysfunction, and  pain.   ACTIVITY LIMITATIONS: carrying, lifting, and bending  PARTICIPATION LIMITATIONS: occupation  PERSONAL FACTORS: Time since onset of injury/illness/exacerbation are also affecting patient's functional outcome.   REHAB POTENTIAL: Good  CLINICAL DECISION MAKING: Stable/uncomplicated  EVALUATION COMPLEXITY: Low   GOALS:  LONG TERM GOALS: Target date: 06/14/23.  Ind with an HEP.  Goal status: INITIAL  2.  Perform ADL's with pain not > 3/10.  Goal status: INITIAL  3.  Improve scapular strength to 5/5. Baseline:  Goal status: INITIAL    PLAN:  PT FREQUENCY: 2x/week  PT DURATION: 6 weeks  PLANNED INTERVENTIONS: 97110-Therapeutic exercises, 97530- Therapeutic activity, [id number]- Neuromuscular re-education, 97535- Self Care, [id number]- Manual therapy, [id number]- Electrical stimulation (unattended), 97035- Ultrasound, Patient/Family education, Cryotherapy, and Moist heat.  PLAN FOR NEXT SESSION: Combo e'stim/US, gentle thoracic mobs, postural exercises, scapular strengthening.     Emmersen Garraway, [place], PT 05/03/2023, 4:25 PM

## 2023-08-22 ENCOUNTER — Ambulatory Visit: Admitting: Nurse Practitioner

## 2023-08-22 ENCOUNTER — Ambulatory Visit: Payer: Self-pay

## 2023-08-22 VITALS — BP 125/84 | HR 71 | Temp 97.7°F | Ht 61.0 in | Wt 128.4 lb

## 2023-08-22 DIAGNOSIS — N898 Other specified noninflammatory disorders of vagina: Secondary | ICD-10-CM | POA: Diagnosis not present

## 2023-08-22 DIAGNOSIS — R300 Dysuria: Secondary | ICD-10-CM | POA: Diagnosis not present

## 2023-08-22 DIAGNOSIS — N3001 Acute cystitis with hematuria: Secondary | ICD-10-CM | POA: Insufficient documentation

## 2023-08-22 DIAGNOSIS — B3731 Acute candidiasis of vulva and vagina: Secondary | ICD-10-CM | POA: Insufficient documentation

## 2023-08-22 LAB — WET PREP FOR TRICH, YEAST, CLUE
Clue Cell Exam: NEGATIVE
Trichomonas Exam: NEGATIVE
Yeast Exam: POSITIVE — AB

## 2023-08-22 LAB — URINALYSIS, ROUTINE W REFLEX MICROSCOPIC
Bilirubin, UA: NEGATIVE
Glucose, UA: NEGATIVE
Nitrite, UA: NEGATIVE
Protein,UA: NEGATIVE
Specific Gravity, UA: 1.025 (ref 1.005–1.030)
Urobilinogen, Ur: 1 mg/dL (ref 0.2–1.0)
pH, UA: 6 (ref 5.0–7.5)

## 2023-08-22 LAB — MICROSCOPIC EXAMINATION
Renal Epithel, UA: NONE SEEN /HPF
Yeast, UA: NONE SEEN

## 2023-08-22 MED ORDER — SULFAMETHOXAZOLE-TRIMETHOPRIM 800-160 MG PO TABS: 1.0000 | ORAL_TABLET | Freq: Two times a day (BID) | ORAL | 0 refills | Status: AC

## 2023-08-22 MED ORDER — FLUCONAZOLE 150 MG PO TABS: 150.0000 mg | ORAL_TABLET | Freq: Once | ORAL | 0 refills | Status: AC

## 2023-08-22 NOTE — Telephone Encounter (Signed)
 Appointment made

## 2023-08-22 NOTE — Telephone Encounter (Signed)
 FYI Only or Action Required?: Action required by provider  Patient was last seen in primary care on 04/29/2023 by Albertha Huger, FNP. Called Nurse Triage reporting Urinary Tract Infection. Symptoms began several days ago. Interventions attempted: Nothing. Symptoms are: gradually worsening.  Triage Disposition: See Physician Within 24 Hours     Patient/caregiver understands and will follow disposition?: YesCopied from CRM #[id number]. Topic: Clinical - Red Word Triage >> Aug 22, 2023 11:09 AM Stanly Early wrote: Red Word that prompted transfer to Nurse Triage: burning/itching history of uti and is in extreme pain. Reason for Disposition  Side (flank) or lower back pain present  Answer Assessment - Initial Assessment Questions 1. SYMPTOM: What's the main symptom you're concerned about? (e.g., frequency, incontinence)     Burning  2. ONSET: When did the    start?     3 days  3. PAIN: Is there any pain? If Yes, ask: How bad is it? (Scale: 1-10; mild, moderate, severe)     9 4. CAUSE: What do you think is causing the symptoms?     Possible herpes outbreak 5. OTHER SYMPTOMS: Do you have any other symptoms? (e.g., blood in urine, fever, flank pain, pain with urination)     Walking is uncomfortable.  Protocols used: Urinary Symptoms-A-AH

## 2023-08-22 NOTE — Progress Notes (Signed)
 Acute Office Visit  Subjective:     Patient ID: Katrina Lin, female    DOB: 1996-02-10, 28 y.o.   MRN: [id number]  Chief Complaint  Patient presents with   Dysuria    Symptoms started 2 days ago   Vaginal Itching   Vaginal Discharge   Katrina Lin is a 28 yrs old female presents 08/22/2023 for UTI complains of urinary frequency, urgency and dysuria x 7 days, without flank pain, fever, chills, or abnormal vaginal discharge or bleeding. Itchiness and vaginal white curly looking discharge Dysuria  Pertinent negatives include no chills, nausea or vomiting.  Vaginal Itching The patient's primary symptoms include vaginal discharge. Associated symptoms include dysuria. Pertinent negatives include no abdominal pain, chills, diarrhea, fever, headaches, nausea, rash, sore throat or vomiting.  Vaginal Discharge The patient's primary symptoms include vaginal discharge. Associated symptoms include dysuria. Pertinent negatives include no abdominal pain, chills, diarrhea, fever, headaches, nausea, rash, sore throat or vomiting.   Reports urgency and frequency LMP 06/28/2023  Active Ambulatory Problems    Diagnosis Date Noted   Chronic midline low back pain with left-sided sciatica 01/12/2018   BMI 29.0-29.9,adult 01/12/2018   Generalized anxiety disorder 11/04/2016   Congenital stricture of urethra 08/27/2021   Chronic cystitis 08/27/2021   Muscle spasm 08/27/2021   Panic disorder 04/01/2022   Chronic rhinitis 12/06/2022   Allergy with anaphylaxis due to food 12/06/2022   Chronic bilateral thoracic back pain 02/17/2023   Depression, recurrent (HCC) 02/17/2023   Acute cystitis with hematuria 08/22/2023   Dysuria 08/22/2023   Vaginal yeast infection 08/22/2023   Resolved Ambulatory Problems    Diagnosis Date Noted   No Resolved Ambulatory Problems   Past Medical History:  Diagnosis Date   Anxiety    Chronic back pain 08/18/2015   Panic attacks    Review of Systems   Constitutional:  Negative for chills and fever.  HENT:  Negative for sore throat.   Respiratory:  Negative for cough and wheezing.   Cardiovascular:  Negative for chest pain and leg swelling.  Gastrointestinal:  Negative for abdominal pain, diarrhea, nausea and vomiting.  Genitourinary:  Positive for dysuria and vaginal discharge.       Vaginal discharge/itchiness  Skin:  Negative for itching and rash.  Neurological:  Negative for dizziness and headaches.   Negative unless indicated in HPI    Objective:    BP 125/84   Pulse 71   Temp 97.7 F (36.5 C) (Temporal)   Ht 5' 1 (1.549 m)   Wt 128 lb 6.4 oz (58.2 kg)   SpO2 98%   BMI 24.26 kg/m  BP Readings from Last 3 Encounters:  08/22/23 125/84  04/29/23 111/70  04/18/23 113/75   Wt Readings from Last 3 Encounters:  08/22/23 128 lb 6.4 oz (58.2 kg)  04/29/23 133 lb 9.6 oz (60.6 kg)  04/18/23 131 lb 12.8 oz (59.8 kg)      Physical Exam Vitals and nursing note reviewed.  Constitutional:      General: She is not in acute distress. HENT:     Head: Normocephalic and atraumatic.     Nose: Nose normal.     Mouth/Throat:     Mouth: Mucous membranes are moist.   Eyes:     Extraocular Movements: Extraocular movements intact.     Conjunctiva/sclera: Conjunctivae normal.     Pupils: Pupils are equal, round, and reactive to light.    Cardiovascular:     Heart sounds: Normal heart sounds.  Pulmonary:  Effort: Pulmonary effort is normal.     Breath sounds: Normal breath sounds.  Abdominal:     General: Abdomen is flat.     Palpations: Abdomen is soft.     Tenderness: There is no guarding.     Hernia: No hernia is present.   Musculoskeletal:        General: Normal range of motion.   Skin:    General: Skin is warm and dry.     Findings: No rash.   Neurological:     Mental Status: She is alert and oriented to person, place, and time.   Psychiatric:        Mood and Affect: Mood normal.        Behavior: Behavior  normal.        Thought Content: Thought content normal.        Judgment: Judgment normal.   Urine dipstick shows positive for blood trace, positive for leukocytes 2+, and positive for ketones trace.  Micro exam: 6-10 WBC's per HPF, 0-2 RBC's per HPF, few+ bacteria, and epithelial cells 0-10.  Microscopic wet-mount exam shows yeast positive, bacteria few, epithelial cells few  No results found for any visits on 08/22/23.      Assessment & Plan:  Dysuria -     Urinalysis, Routine w reflex microscopic -     WET PREP FOR TRICH, YEAST, CLUE -     Sulfamethoxazole-Trimethoprim; Take 1 tablet by mouth 2 (two) times daily for 7 days.  Dispense: 14 tablet; Refill: 0 -     Urine Culture  Vaginal yeast infection -     Fluconazole ; Take 1 tablet (150 mg total) by mouth once for 1 dose. May repeat after 3 days if needed.  Dispense: 2 tablet; Refill: 0  Acute cystitis with hematuria -     Sulfamethoxazole-Trimethoprim; Take 1 tablet by mouth 2 (two) times daily for 7 days.  Dispense: 14 tablet; Refill: 0 -     Urine Culture  Vaginal itching -     Urinalysis, Routine w reflex microscopic -     WET PREP FOR TRICH, YEAST, CLUE -     Fluconazole ; Take 1 tablet (150 mg total) by mouth once for 1 dose. May repeat after 3 days if needed.  Dispense: 2 tablet; Refill: 0   Katrina Lin is a 28 year old Hispanic female seen today for acute cystitis, no acute distress  We will treat with broad-spectrum antibiotic Bactrim for 7 days while waiting for culture result Report positive for yeast we will do Diflucan  She understand based on culture result and no antibiotic may be needed also push fluids, may use Pyridium OTC prn. Call or return to clinic prn if these symptoms worsen or fail to improve as anticipated.  The above assessment and management plan was discussed with the patient. The patient verbalized understanding of and has agreed to the management plan. Patient is aware to call the clinic if they develop  any new symptoms or if symptoms persist or worsen. Patient is aware when to return to the clinic for a follow-up visit. Patient educated on when it is appropriate to go to the emergency department.  Return if symptoms worsen or fail to improve.  Katrina Couts [place] Thompson, DNP [facility] [address] [place], [place] [address] [phone]  Note: This document was prepared by [employer] voice dictation technology and any errors that results from this process are unintentional.

## 2023-08-23 LAB — PREGNANCY, URINE: Preg Test, Ur: NEGATIVE

## 2023-08-24 LAB — URINE CULTURE

## 2023-08-25 ENCOUNTER — Ambulatory Visit: Payer: Self-pay | Admitting: Nurse Practitioner

## 2023-10-26 ENCOUNTER — Ambulatory Visit: Admitting: Family

## 2023-10-30 NOTE — Patient Instructions (Incomplete)
 Chronic rhinitis Continue an antihistamine once a day as needed for runny nose or itch. Some examples of over the counter antihistamines include Zyrtec  (cetirizine ), Xyzal (levocetirizine), Allegra (fexofenadine), and Claritin (loratidine).  Consider saline nasal rinses as needed for nasal symptoms. Use this before any medicated nasal sprays for best result Consider updating your environmental allergies  Food allergy Continue to avoid fish, shellfish, and mollusks. In case of an allergic reaction, take Benadryl 50 mg every 4 hours, and if life-threatening symptoms occur, inject with AuviQ 0.3 mg. Consider updating your food allergy testing. Remember to stop antihistamines for 3 days before the testing appointment  Call the clinic if this treatment plan is not working well for you.  Follow up in 1 year or sooner if needed.

## 2023-10-31 ENCOUNTER — Ambulatory Visit: Admitting: Family

## 2023-10-31 DIAGNOSIS — J309 Allergic rhinitis, unspecified: Secondary | ICD-10-CM

## 2023-11-15 ENCOUNTER — Ambulatory Visit: Admitting: Internal Medicine

## 2023-11-15 ENCOUNTER — Encounter: Payer: Self-pay | Admitting: Internal Medicine

## 2023-11-15 ENCOUNTER — Other Ambulatory Visit: Payer: Self-pay

## 2023-11-15 VITALS — BP 122/74 | HR 87 | Temp 98.8°F | Ht 60.0 in | Wt 134.5 lb

## 2023-11-15 DIAGNOSIS — L500 Allergic urticaria: Secondary | ICD-10-CM | POA: Diagnosis not present

## 2023-11-15 DIAGNOSIS — J3089 Other allergic rhinitis: Secondary | ICD-10-CM

## 2023-11-15 MED ORDER — EPINEPHRINE 0.3 MG/0.3ML IJ SOAJ: 0.3000 mg | INTRAMUSCULAR | 1 refills | Status: AC | PRN

## 2023-11-15 NOTE — Patient Instructions (Addendum)
 Chronic Rhinitis - Use Claritin 10 mg daily as needed for runny nose, sneezing, itchy watery eyes.   Food Allergy - please strictly avoid shellfish and fish.  - for SKIN only reaction, okay to take Zyrtec  10 mg every 12 hours as needed - for SKIN + ANY additional symptoms, OR IF concern for LIFE THREATENING reaction = Epipen  Autoinjector EpiPen  0.3 mg. - If using Epinephrine  autoinjector, call 911 or go to the ER.   Hold all anti-histamines (Xyzal, Allegra, Zyrtec , Claritin, Benadryl, Pepcid) 3 days prior to next visit.  Follow up: 9/17 at 230 for skin testing 1-55, shellfish & fish mix and individuals.

## 2023-11-15 NOTE — Progress Notes (Signed)
   FOLLOW UP Date of Service/Encounter:  11/15/23   Subjective:  Katrina Lin (DOB: 08-Aug-1995) is a 28 y.o. female who [facility] for follow up for chronic rhinitis and food allergy.   History obtained from: chart review and patient. Last seen on 12/06/2022 with Arlean Mutter Chronic rhinitis controlled on anti histamines, consider updated allergy testing. Food allergy- avoiding fish and shellfish, keep Epipen .  Previously used to eat seafood but then had GI symptoms with shrimp and was tested and noted to have positive test to shellfish and therefore she stopped eating it.  Also avoids fish. No accidental exposure.  Has Auvi Q but needs refills. Chart review shows that she developed itchy hives and nausea with shellfish exposure.    Rhinitis is worse in Spring with congestion and drainage for which she uses Claritin.  Has not been allergy tested but interested in it.  No hx of sinus surgeries.   Past Medical History: Past Medical History:  Diagnosis Date   Anxiety    Chronic back pain 08/18/2015   Panic attacks     Objective:  BP 122/74 (BP Location: Right Arm, Patient Position: Sitting, Cuff Size: Normal)   Pulse 87   Temp 98.8 F (37.1 C) (Temporal)   Ht 5' (1.524 m)   Wt 134 lb 8 oz (61 kg)   SpO2 100%   BMI 26.27 kg/m  Body mass index is 26.27 kg/m. Physical Exam: GEN: alert, well developed HEENT: clear conjunctiva, nose with mild inferior turbinate hypertrophy, pink nasal mucosa, + clear rhinorrhea, no cobblestoning HEART: regular rate and rhythm, no murmur LUNGS: clear to auscultation bilaterally, no coughing, unlabored respiration SKIN: no rashes or lesions   Assessment:   1. Other allergic rhinitis   2. Allergic urticaria due to ingested food     Plan/Recommendations:  Chronic Rhinitis - Due to turbinate hypertrophy, seasonal symptoms and unresponsive to over the counter meds, will perform skin testing to  identify aeroallergen triggers at next visit.  - Use Claritin 10 mg daily as needed for runny nose, sneezing, itchy watery eyes.   Food Allergy - please strictly avoid shellfish and fish.  - will obtain blood testing and skin testing for seafood at next visit.  - sIgE 02/2020 positive to shellfish, negative to fish.   - Initial rxn: hives and nausea with shellfish  - for SKIN only reaction, okay to take Zyrtec  10 mg every 12 hours as needed - for SKIN + ANY additional symptoms, OR IF concern for LIFE THREATENING reaction = Epipen  Autoinjector EpiPen  0.3 mg. - If using Epinephrine  autoinjector, call 911 or go to the ER.   Hold all anti-histamines (Xyzal, Allegra, Zyrtec , Claritin, Benadryl, Pepcid) 3 days prior to next visit.   Follow up: 9/17 at 230 for skin testing 1-55, shellfish & fish mix and individuals. Also bloodwork for fish and shellfish.  No IDs  Arleta Blanch, MD [facility]

## 2023-11-23 ENCOUNTER — Ambulatory Visit: Admitting: Internal Medicine

## 2023-11-23 ENCOUNTER — Encounter: Payer: Self-pay | Admitting: Internal Medicine

## 2023-11-23 DIAGNOSIS — J301 Allergic rhinitis due to pollen: Secondary | ICD-10-CM

## 2023-11-23 DIAGNOSIS — L500 Allergic urticaria: Secondary | ICD-10-CM

## 2023-11-23 DIAGNOSIS — J3081 Allergic rhinitis due to animal (cat) (dog) hair and dander: Secondary | ICD-10-CM | POA: Diagnosis not present

## 2023-11-23 MED ORDER — LORATADINE 10 MG PO TABS: 10.0000 mg | ORAL_TABLET | Freq: Every day | ORAL | 5 refills | Status: DC | PRN

## 2023-11-23 MED ORDER — AZELASTINE HCL 0.1 % NA SOLN: 2.0000 | Freq: Two times a day (BID) | NASAL | 5 refills | Status: DC | PRN

## 2023-11-23 NOTE — Patient Instructions (Addendum)
 Allergic Rhinitis: - Positive skin test 11/2023: grasses, trees, cats  - Use nasal saline rinses before nose sprays such as with Neilmed Sinus Rinse.  Use distilled water.   - Use Azelastine  2 sprays each nostril twice daily as needed for runny nose, drainage, sneezing, congestion. Aim upward and outward. - Use Claritin  10 mg daily as needed for runny nose, sneezing, itchy watery eyes.  - Consider allergy  shots as long term control of your symptoms by teaching your immune system to be more tolerant of your allergy  triggers  Food Allergy  - please strictly avoid shellfish and fish.  - 11/2023 SPT negative to shellfish and fish.  Will obtain bloodwork.   - for SKIN only reaction, okay to take Zyrtec  10 mg every 12 hours as needed - for SKIN + ANY additional symptoms, OR IF concern for LIFE THREATENING reaction = Epipen  Autoinjector EpiPen  0.3 mg. - If using Epinephrine  autoinjector, call 911 or go to the ER.

## 2023-11-23 NOTE — Progress Notes (Signed)
 FOLLOW UP Date of Service/Encounter:  11/23/23   Subjective:  Katrina Lin (DOB: 04/11/1995) is a 28 y.o. female who [facility] for follow up for skin testing.   History obtained from: chart review and patient.  Anti histamines held.   Past Medical History: Past Medical History:  Diagnosis Date   Anxiety    Chronic back pain 08/18/2015   Panic attacks     Objective:  There were no vitals taken for this visit. There is no height or weight on file to calculate BMI. Physical Exam: GEN: alert, well developed HEENT: clear conjunctiva, MMM LUNGS: unlabored respiration  Skin Testing:  Skin prick testing was placed, which includes aeroallergens/foods, histamine control, and saline control.  Verbal consent was obtained prior to placing test.  Patient tolerated procedure well.  Allergy  testing results were read and interpreted by myself, documented by clinical staff. Adequate positive and negative control.  Positive results to:  Results discussed with patient/family.  Airborne Adult Perc - 11/23/23 1500     Time Antigen Placed 1450    Allergen Manufacturer Jestine    Location Back    Number of Test 55    1. Control-Buffer 50% Glycerol Negative    2. Control-Histamine 3+    3. [place]ia 3+    4. [place] 3+    5. Johnson 3+    6. [place]  Blue 3+    7. Meadow Fescue Negative    8. Perennial Rye 3+    9. Timothy 3+    10. Ragweed Mix Negative    11. Cocklebur Negative    12. Plantain,  English Negative    13. Baccharis Negative    14. Dog Fennel Negative    15. Russian Thistle Negative    16. Lamb's Quarters Negative    17. Sheep Sorrell Negative    18. Rough Pigweed Negative    19. Marsh Elder, Rough Negative    20. Mugwort, Common Negative    21. Box, Elder Negative    22. Cedar, red 3+    23. Sweet Gum Negative    24. Pecan Pollen 3+    25. Pine Mix Negative    26. Walnut, Black Pollen 3+    27. Red Mulberry Negative     28. Ash Mix Negative    29. Birch Mix Negative    30. Beech American Negative    31. Cottonwood, Guinea-[place]issau Negative    32. Hickory, White 3+    33. Maple Mix Negative    34. Oak, [place] Mix 3+    35. Sycamore Eastern Negative    36. Alternaria Alternata Negative    37. Cladosporium Herbarum Negative    38. Aspergillus Mix Negative    39. Penicillium Mix Negative    40. Bipolaris Sorokiniana (Helminthosporium) Negative    41. Drechslera Spicifera (Curvularia) Negative    42. Mucor Plumbeus Negative    43. Fusarium Moniliforme Negative    44. Aureobasidium Pullulans (pullulara) Negative    45. Rhizopus Oryzae Negative    46. Botrytis Cinera Negative    47. Epicoccum Nigrum Negative    48. Phoma Betae Negative    49. Dust Mite Mix Negative    50. Cat Hair 10,000 BAU/ml 3+    51.  Dog Epithelia Negative    52. Mixed Feathers Negative    53. Horse Epithelia Negative    54. Cockroach, German Negative    55. Tobacco Leaf Negative  Food Adult Perc - 11/23/23 1500     Time Antigen Placed 1450    Allergen Manufacturer Jestine    Location Back    Number of allergen test 12    8. Shellfish Mix Negative    9. Fish Mix Negative    18. Trout Negative    19. Tuna Negative    20. Salmon Negative    21. Flounder Negative    22. Codfish Negative    23. Shrimp Negative    24. Crab Negative    25. Lobster Negative    26. Oyster Negative    27. Scallops Negative           Assessment:   1. Seasonal allergic rhinitis due to pollen   2. Allergic urticaria due to ingested food   3. Allergic rhinitis due to animal hair or dander     Plan/Recommendations:  Allergic Rhinitis: - Due to turbinate hypertrophy, seasonal symptoms and unresponsive to over the counter meds, will perform skin testing to identify aeroallergen triggers.   - Positive skin test 11/2023: grasses, trees, cats  - Avoidance measures discussed. - Use nasal saline rinses before nose sprays such as with  Neilmed Sinus Rinse.  Use distilled water.   - Use Azelastine  2 sprays each nostril twice daily as needed for runny nose, drainage, sneezing, congestion. Aim upward and outward. - Use Claritin  10 mg daily as needed for runny nose, sneezing, itchy watery eyes.  - Consider allergy  shots as long term control of your symptoms by teaching your immune system to be more tolerant of your allergy  triggers  Food Allergy  - please strictly avoid shellfish and fish.  - 11/2023 SPT negative to shellfish and fish.  Will obtain bloodwork.   - sIgE 02/2020 positive to shellfish, negative to fish.   - Initial rxn: hives and nausea with shellfish  - for SKIN only reaction, okay to take Zyrtec  10 mg every 12 hours as needed - for SKIN + ANY additional symptoms, OR IF concern for LIFE THREATENING reaction = Epipen  Autoinjector EpiPen  0.3 mg. - If using Epinephrine  autoinjector, call 911 or go to the ER.     Return in about 3 months (around 02/22/2024).  Arleta Blanch, MD [facility] 

## 2023-11-25 LAB — ALLERGEN PROFILE, FOOD-FISH
Allergen Mackerel IgE: <0.1 kU/L
Allergen Salmon IgE: <0.1 kU/L
Allergen Trout IgE: <0.1 kU/L
Allergen Walley Pike IgE: <0.1 kU/L
Codfish IgE: <0.1 kU/L
Halibut IgE: <0.1 kU/L
Tuna: <0.1 kU/L

## 2023-11-25 LAB — ALLERGEN PROFILE, SHELLFISH
Clam IgE: 0.12 kU/L — AB
F023-IgE Crab: <0.1 kU/L
F080-IgE Lobster: <0.1 kU/L
F290-IgE Oyster: <0.1 kU/L
Scallop IgE: 0.14 kU/L — AB
Shrimp IgE: 1.03 kU/L — AB

## 2023-11-28 ENCOUNTER — Ambulatory Visit: Payer: Self-pay | Admitting: Internal Medicine

## 2024-01-16 ENCOUNTER — Ambulatory Visit: Admitting: Family

## 2024-01-16 ENCOUNTER — Telehealth: Payer: Self-pay | Admitting: Family Medicine

## 2024-01-16 VITALS — BP 146/97 | HR 90 | Temp 97.0°F | Ht 60.0 in | Wt 136.2 lb

## 2024-01-16 DIAGNOSIS — R21 Rash and other nonspecific skin eruption: Secondary | ICD-10-CM | POA: Diagnosis not present

## 2024-01-16 MED ORDER — PREDNISONE 10 MG (21) PO TBPK: ORAL_TABLET | ORAL | 0 refills | Status: DC

## 2024-01-16 NOTE — Telephone Encounter (Signed)
 Letter sent to My Chart

## 2024-01-16 NOTE — Patient Instructions (Signed)
 Rash, Adult A rash is a breakout of spots or blotches on the skin. It can affect the way the skin looks and feels. Many things can cause a rash. Common causes include: Viral infections. These include colds, measles, and hand, foot, and mouth disease. Bacterial infections. These include scarlet fever and impetigo. Fungal infections. These include athlete's foot, ringworm, and yeast rashes. Skin irritation. This may be from heat rash, exposure to moisture or friction for a long time (intertrigo), or exposure to soap or skin care products (eczema). Allergic reactions. These may be caused by foods, medicines, or things like poison ivy. Some rashes may go away after a few days. Others may last for a few weeks. The goal of treatment is to stop the itching and keep the rash from spreading. Follow these instructions at home: Medicine Take or apply over-the-counter and prescription medicines only as told by your health care provider. These may include: Corticosteroids. These can help treat red or swollen skin. They may be given as creams or as medicines to take by mouth (orally). Anti-itch lotions. Allergy medicines. Pain medicine. Antifungal medicine if the rash is from a fungal infection. Antibiotics if you have an infection.  Skin care Apply cool, wet cloths (compresses) to the affected areas. Do not scratch or rub your skin. Avoid covering the rash. Keep it exposed to air as often as you can. Managing itching and discomfort Avoid hot showers and baths. These can make itching worse. A cold shower may help. Try taking a bath with: Epsom salts. You can get these at your local pharmacy or grocery store. Follow the instructions on the package. Baking soda. Pour a small amount into the bath as told by your provider. Colloidal oatmeal. You can get this at your local pharmacy or grocery store. Follow the instructions on the package. Try putting baking soda paste on your skin. Stir water into baking  soda until it becomes like a paste. Try using calamine lotion or cortisone cream to help with itchiness. Keep cool. Stay out of the sun. Sweating and being hot can make itching worse. General instructions  Rest as needed. Drink enough fluid to keep your pee (urine) pale yellow. Wear loose-fitting clothes. Avoid scented soaps, detergents, and perfumes. Use gentle soaps, detergents, perfumes, and cosmetics. Avoid the things that cause your rash (triggers). Keep a journal to help keep track of your triggers. Write down: What you eat. What cosmetics you use. What you drink. What you wear. This includes jewelry. Contact a health care provider if: You sweat at night more than normal. You pee (urinate) more or less than normal, or your pee is a darker color than normal. Your eyes become sensitive to light. Your skin or the white parts of your eyes turn yellow (jaundice). Your skin tingles or is numb. You get painful blisters in your nose or mouth. Your rash does not go away after a few days, or it gets worse. You are more tired or thirsty than normal. You have new or worse symptoms. These may include: Pain in your abdomen. Fever. Diarrhea or vomiting. Weakness or weight loss. Get help right away if: You get confused. You have a severe headache, a stiff neck, or severe joint pain or stiffness. You become very sleepy or not responsive. You have a seizure. This information is not intended to replace advice given to you by your health care provider. Make sure you discuss any questions you have with your health care provider. Document Revised: 12/11/2021 Document Reviewed:  12/11/2021 Elsevier Patient Education  2024 [employer].

## 2024-01-16 NOTE — Telephone Encounter (Signed)
 Copied from CRM #[id number]. Topic: Medical Record Request - Records Request >> Jan 16, 2024 11:33 AM Wyona SQUIBB wrote: Reason for CRM: Pt was seen today 11/10 and needs a note for work to be cleared until Friday. Does not want to have anyone at work get rash.    Pt would like to know if can be sent to my chart.   Pls follow up with pt if can be done. [phone]

## 2024-01-16 NOTE — Progress Notes (Signed)
 Subjective:    Patient ID: Katrina Lin, female    DOB: 25-Apr-1995, 28 y.o.   MRN: [id number]  Chief Complaint  Patient presents with   Rash   PT presents to the office today with rash on face, neck, bilateral arms, back and legs that she notice this on Friday 01/13/24. She went to the Urgent Care on Saturday and was given hydrocortisone  cream.   She has a shellfish allergy , but has not had any known exposure.  Rash This is a new problem. The current episode started in the past 7 days. The problem has been waxing and waning since onset. The affected locations include the head, face, back, left arm, left upper leg, left lower leg and right arm. The rash is characterized by itchiness and redness. Pertinent negatives include no congestion, cough, diarrhea, fatigue, fever or joint pain. Past treatments include anti-itch cream and antihistamine. The treatment provided mild relief.      Review of Systems  Constitutional:  Negative for fatigue and fever.  HENT:  Negative for congestion.   Respiratory:  Negative for cough.   Gastrointestinal:  Negative for diarrhea.  Musculoskeletal:  Negative for joint pain.  Skin:  Positive for rash.  All other systems reviewed and are negative.   Social History   Socioeconomic History   Marital status: Single    Spouse name: Not on file   Number of children: Not on file   Years of education: Not on file   Highest education level: Associate degree: academic program  Occupational History   Not on file  Tobacco Use   Smoking status: Never   Smokeless tobacco: Never  Vaping Use   Vaping status: Never Used  Substance and Sexual Activity   Alcohol use: Yes    Comment: socially   Drug use: Never   Sexual activity: Yes    Birth control/protection: Pill  Other Topics Concern   Not on file  Social History Narrative   Not on file   Social Drivers of Health   Financial Resource Strain: Medium Risk (01/16/2024)   Overall Financial  Resource Strain (CARDIA)    Difficulty of Paying Living Expenses: Somewhat hard  Food Insecurity: Food Insecurity Present (01/16/2024)   Hunger Vital Sign    Worried About Running Out of Food in the Last Year: Never true    Ran Out of Food in the Last Year: Sometimes true  Transportation Needs: No Transportation Needs (01/16/2024)   PRAPARE - Administrator, Civil Service (Medical): No    Lack of Transportation (Non-Medical): No  Physical Activity: Insufficiently Active (01/16/2024)   Exercise Vital Sign    Days of Exercise per Week: 2 days    Minutes of Exercise per Session: 20 min  Stress: Stress Concern Present (01/16/2024)   [employer] of Occupational Health - Occupational Stress Questionnaire    Feeling of Stress: Very much  Social Connections: Unknown (01/16/2024)   Social Connection and Isolation Panel    Frequency of Communication with Friends and Family: Three times a week    Frequency of Social Gatherings with Friends and Family: Once a week    Attends Religious Services: Patient declined    Database Administrator or Organizations: No    Attends Engineer, Structural: Not on file    Marital Status: Patient declined   Family History  Problem Relation Age of Onset   Hypertension Mother    Hyperlipidemia Mother    Diabetes Maternal Uncle  Hypertension Paternal Grandfather    Hyperlipidemia Paternal Grandfather         Objective:   Physical Exam Vitals reviewed.  Constitutional:      General: She is not in acute distress.    Appearance: She is well-developed.  HENT:     Head: Normocephalic and atraumatic.  Eyes:     Pupils: Pupils are equal, round, and reactive to light.  Neck:     Thyroid: No thyromegaly.  Cardiovascular:     Rate and Rhythm: Normal rate and regular rhythm.     Heart sounds: Normal heart sounds. No murmur heard. Pulmonary:     Effort: Pulmonary effort is normal. No respiratory distress.     Breath sounds: Normal  breath sounds. No wheezing.  Abdominal:     General: Bowel sounds are normal. There is no distension.     Palpations: Abdomen is soft.     Tenderness: There is no abdominal tenderness.  Musculoskeletal:        General: No tenderness. Normal range of motion.     Cervical back: Normal range of motion and neck supple.  Skin:    General: Skin is warm and dry.     Findings: Rash present.     Comments: papule rash on forehead, back pain, bilateral arms and legs  Neurological:     Mental Status: She is alert and oriented to person, place, and time.     Cranial Nerves: No cranial nerve deficit.     Deep Tendon Reflexes: Reflexes are normal and symmetric.  Psychiatric:        Behavior: Behavior normal.        Thought Content: Thought content normal.        Judgment: Judgment normal.          BP (!) 146/97   Pulse 90   Temp (!) 97 F (36.1 C)   Ht 5' (1.524 m)   Wt 136 lb 3.2 oz (61.8 kg)   BMI 26.60 kg/m      Assessment & Plan:  [name] comes in today with chief complaint of Rash   Diagnosis and orders addressed:  1. Rash and nonspecific skin eruption (Primary) Unsure if viral vs allergic? Will give prednisone and continue hydrocortisone   Avoid scratching  Referral to dermatologists  Report any worsening symptoms or fevers - predniSONE (STERAPRED UNI-PAK 21 TAB) 10 MG (21) TBPK tablet; Use as directed  Dispense: 21 tablet; Refill: 0 - Ambulatory referral to Dermatology      Bari Learn, FNP

## 2024-01-31 ENCOUNTER — Encounter: Payer: Self-pay | Admitting: Nurse Practitioner

## 2024-01-31 ENCOUNTER — Ambulatory Visit: Payer: Self-pay

## 2024-01-31 ENCOUNTER — Ambulatory Visit (INDEPENDENT_AMBULATORY_CARE_PROVIDER_SITE_OTHER): Admitting: Nurse Practitioner

## 2024-01-31 VITALS — BP 136/89 | HR 89 | Temp 98.1°F | Ht 60.0 in | Wt 137.0 lb

## 2024-01-31 DIAGNOSIS — R21 Rash and other nonspecific skin eruption: Secondary | ICD-10-CM

## 2024-01-31 NOTE — Progress Notes (Signed)
   Subjective:    Patient ID: Katrina Lin, female    DOB: 08/14/1995, 28 y.o.   MRN: [id number]   Chief Complaint: Rash   Rash Pertinent negatives include no eye pain or shortness of breath.    Patient has a a rash on face. Saw [facility] and was referred to dermatology. Dermatologist took bx and dermatology wanted her to have lab work.  Patient Active Problem List   Diagnosis Date Noted   Acute cystitis with hematuria 08/22/2023   Dysuria 08/22/2023   Vaginal yeast infection 08/22/2023   Chronic bilateral thoracic back pain 02/17/2023   Depression, recurrent 02/17/2023   Chronic rhinitis 12/06/2022   Allergy  with anaphylaxis due to food 12/06/2022   Panic disorder 04/01/2022   Congenital stricture of urethra 08/27/2021   Chronic cystitis 08/27/2021   Muscle spasm 08/27/2021   Chronic midline low back pain with left-sided sciatica 01/12/2018   BMI 29.0-29.9,adult 01/12/2018   Generalized anxiety disorder 11/04/2016       Review of Systems  Constitutional:  Negative for diaphoresis.  Eyes:  Negative for pain.  Respiratory:  Negative for shortness of breath.   Cardiovascular:  Negative for chest pain, palpitations and leg swelling.  Gastrointestinal:  Negative for abdominal pain.  Endocrine: Negative for polydipsia.  Skin:  Positive for rash.  Neurological:  Negative for dizziness, weakness and headaches.  Hematological:  Does not bruise/bleed easily.  All other systems reviewed and are negative.      Objective:   Physical Exam  No assessment done      Assessment & Plan:   Just needed labs from dermatology  Mary-Margaret Gladis, FNP

## 2024-01-31 NOTE — Telephone Encounter (Signed)
 FYI Only or Action Required?: FYI only for provider: appointment scheduled on 01/31/24.  Patient was last seen in primary care on 01/16/2024 by Lavell Bari LABOR, FNP.  Called Nurse Triage reporting Rash.  Symptoms began a week ago.  Interventions attempted: Nothing.  Symptoms are: unchanged.  Triage Disposition: See PCP When Office is Open (Within 3 Days)  Patient/caregiver understands and will follow disposition?: Yes   Copied from CRM #[id number]. Topic: Clinical - Red Word Triage >> Jan 31, 2024  9:15 AM [name] wrote: Red Word that prompted transfer to Nurse Triage: patient has Rash all over body, including vaginal area. Dermatologist told her to get blood work to rule out syphilis. Causing irritation and pain >> Jan 31, 2024  9:16 AM [name] wrote: [name] gave her sister [name] verbal permission  Reason for Disposition  Mild widespread rash  (Exception: Heat rash lasting 3 days or less.)  Answer Assessment - Initial Assessment Questions 1. CALLER DIAGNOSIS: What do you think is causing the rash? (e.g., athlete's foot, chickenpox, hives, impetigo)     Rash came back, seen Dermatologist 2 days ago, advised blood work needed to r/o syphilis 2. LOCALIZED OR WIDESPREAD:  Is the rash all over (widespread) or mostly just in one area of the body (localized)?      Rash all over body, red flat and raised Unsure if skin intact 3. NEW MEDICINES: Are you taking any new medicine?     pills 4. APPEARANCE of RASH: What does the rash look like? What color is it?  Note: It is difficult to assess rash color in people with darker-colored skin. When this situation occurs, simply ask the caller to describe what they see.     Red; unsure  5. FEVER: Do you have a fever? If Yes, ask: What is your temperature, how was it measured, and when did it start? Denies HA, dizziness, diff breathing, fever, chills, n/v  Answer Assessment - Initial Assessment Questions No available appts with  pcp. Scheduled appt 01/31/24.  Advised call back or ED if symptoms worsen.  Patient's sister called and reports, patient currently at work.  1. CALLER DIAGNOSIS: What do you think is causing the rash? (e.g., athlete's foot, chickenpox, hives, impetigo)     Rash came back, seen Dermatologist 2 days ago, advised blood work needed to r/o syphilis 2. LOCALIZED OR WIDESPREAD:  Is the rash all over (widespread) or mostly just in one area of the body (localized)?      Rash all over body, red flat and raised Unsure if skin intact 3. NEW MEDICINES: Are you taking any new medicine?     pills 4. APPEARANCE of RASH: What does the rash look like? What color is it?  Note: It is difficult to assess rash color in people with darker-colored skin. When this situation occurs, simply ask the caller to describe what they see.     Red; unsure  5. FEVER: Do you have a fever? If Yes, ask: What is your temperature, how was it measured, and when did it start? Denies HA, dizziness, diff breathing, fever, chills, n/v  Protocols used: Rash - Guideline Selection-A-AH, Rash or Redness - [facility]

## 2024-01-31 NOTE — Telephone Encounter (Signed)
 Appt made.

## 2024-02-01 LAB — SYPHILIS: RPR W/REFLEX TO RPR TITER AND TREPONEMAL ANTIBODIES, TRADITIONAL SCREENING AND DIAGNOSIS ALGORITHM: RPR Ser Ql: NONREACTIVE

## 2024-02-03 ENCOUNTER — Telehealth: Admitting: Family Medicine

## 2024-02-03 DIAGNOSIS — H109 Unspecified conjunctivitis: Secondary | ICD-10-CM

## 2024-02-03 MED ORDER — POLYMYXIN B-TRIMETHOPRIM 10000-0.1 UNIT/ML-% OP SOLN: 1.0000 [drp] | OPHTHALMIC | 0 refills | Status: AC

## 2024-02-03 NOTE — Patient Instructions (Signed)
 Katrina Lin, thank you for joining Roosvelt Mater, PA-C for today's virtual visit.  While this provider is not your primary care provider (PCP), if your PCP is located in our provider database this encounter information will be shared with them immediately following your visit.   [name] account gives you access to today's visit and all your visits, tests, and labs performed at [facility]  click here if you don't have a Katrina Lin MyChart account or go to [email or web address]  Consent: (Patient) Katrina Lin provided verbal consent for this virtual visit at the beginning of the encounter.  Current Medications:  Current Outpatient Medications:    trimethoprim -polymyxin b (POLYTRIM) ophthalmic solution, Place 1 drop into the left eye every 4 (four) hours for 5 days., Disp: 10 mL, Rfl: 0   azelastine  (ASTELIN ) 0.1 % nasal spray, Place 2 sprays into both nostrils 2 (two) times daily as needed for allergies. Use in each nostril as directed (Patient not taking: Reported on 01/16/2024), Disp: 30 mL, Rfl: 5   Cholecalciferol (VITAMIN D3 PO), Take by mouth., Disp: , Rfl:    clonazePAM (KLONOPIN) 0.5 MG tablet, Take 0.25 mg by mouth daily as needed. (Patient not taking: Reported on 01/16/2024), Disp: , Rfl:    Cyanocobalamin (VITAMIN B-12 PO), Take by mouth. (Patient not taking: Reported on 01/16/2024), Disp: , Rfl:    EPINEPHrine  (AUVI-Q ) 0.3 mg/0.3 mL IJ SOAJ injection, Inject 0.3 mg into the muscle as needed for anaphylaxis. (Patient not taking: Reported on 01/16/2024), Disp: 1 each, Rfl: 1   EPINEPHrine  (AUVI-Q ) 0.3 mg/0.3 mL IJ SOAJ injection, Inject 0.3 mg into the muscle as needed for anaphylaxis. (Patient not taking: Reported on 01/16/2024), Disp: 2 each, Rfl: 1   folic acid (FOLVITE) 1 MG tablet, Take 1 mg by mouth daily., Disp: , Rfl:    naproxen  (NAPROSYN ) 375 MG tablet, Take 1 tablet (375 mg total) by mouth 2 (two) times daily., Disp: 20 tablet, Rfl: 5    norethindrone-ethinyl estradiol (LOESTRIN) 1-20 MG-MCG tablet, Take 1 tablet by mouth daily., Disp: , Rfl:    Medications ordered in this encounter:  Meds ordered this encounter  Medications   trimethoprim -polymyxin b (POLYTRIM) ophthalmic solution    Sig: Place 1 drop into the left eye every 4 (four) hours for 5 days.    Dispense:  10 mL    Refill:  0     *If you need refills on other medications prior to your next appointment, please contact your pharmacy*  Follow-Up: Call back or seek an in-person evaluation if the symptoms worsen or if the condition fails to improve as anticipated.  [facility] [phone]  Other Instructions Bacterial Conjunctivitis, Adult Bacterial conjunctivitis is an infection of the clear membrane that covers the white part of the eye and the inner surface of the eyelid (conjunctiva). When the blood vessels in the conjunctiva become inflamed, the eye becomes red or pink. The eye often feels irritated or itchy. Bacterial conjunctivitis spreads easily from person to person (is contagious). It also spreads easily from one eye to the other eye. What are the causes? This condition is caused by bacteria. You may get the infection if you come into close contact with: A person who is infected with the bacteria. Items that are contaminated with the bacteria, such as a face towel, contact lens solution, or eye makeup. What increases the risk? You are more likely to develop this condition if: You are exposed to other people who have  the infection. You wear contact lenses. You have a sinus infection. You have had a recent eye injury or surgery. You have a weak body defense system (immune system). You have a medical condition that causes dry eyes. What are the signs or symptoms? Symptoms of this condition include: Thick, yellowish discharge from the eye. This may turn into a crust on the eyelid overnight and cause your eyelids to stick  together. Tearing or watery eyes. Itchy eyes. Burning feeling in your eyes. Eye redness. Swollen eyelids. Blurred vision. How is this diagnosed? This condition is diagnosed based on your symptoms and medical history. Your health care provider may also take a sample of discharge from your eye to find the cause of your infection. How is this treated? This condition may be treated with: Antibiotic eye drops or ointment to clear the infection more quickly and prevent the spread of infection to others. Antibiotic medicines taken by mouth (orally) to treat infections that do not respond to drops or ointments or that last longer than 10 days. Cool, wet cloths (cool compresses) placed on the eyes. Artificial tears applied 2-6 times a day. Follow these instructions at home: Medicines Take or apply your antibiotic medicine as told by your health care provider. Do not stop using the antibiotic, even if your condition improves, unless directed by your health care provider. Take or apply over-the-counter and prescription medicines only as told by your health care provider. Be very careful to avoid touching the edge of your eyelid with the eye-drop bottle or the ointment tube when you apply medicines to the affected eye. This will keep you from spreading the infection to your other eye or to other people. Managing discomfort Gently wipe away any drainage from your eye with a warm, wet washcloth or a cotton ball. Apply a clean, cool compress to your eye for 10-20 minutes, 3-4 times a day. General instructions Do not wear contact lenses until the inflammation is gone and your health care provider says it is safe to wear them again. Ask your health care provider how to sterilize or replace your contact lenses before you use them again. Wear glasses until you can resume wearing contact lenses. Avoid wearing eye makeup until the inflammation is gone. Throw away any old eye cosmetics that may be  contaminated. Change or wash your pillowcase every day. Do not share towels or washcloths. This may spread the infection. Wash your hands often with soap and water for at least 20 seconds and especially before touching your face or eyes. Use paper towels to dry your hands. Avoid touching or rubbing your eyes. Do not drive or use heavy machinery if your vision is blurred. Contact a health care provider if: You have a fever. Your symptoms do not get better after 10 days. Get help right away if: You have a fever and your symptoms suddenly get worse. You have severe pain when you move your eye. You have facial pain, redness, or swelling. You have a sudden loss of vision. Summary Bacterial conjunctivitis is an infection of the clear membrane that covers the white part of the eye and the inner surface of the eyelid (conjunctiva). Bacterial conjunctivitis spreads easily from eye to eye and from person to person (is contagious). Wash your hands often with soap and water for at least 20 seconds and especially before touching your face or eyes. Use paper towels to dry your hands. Take or apply your antibiotic medicine as told by your health care provider. Do  not stop using the antibiotic even if your condition improves. Contact a health care provider if you have a fever or if your symptoms do not get better after 10 days. Get help right away if you have a sudden loss of vision. This information is not intended to replace advice given to you by your health care provider. Make sure you discuss any questions you have with your health care provider. Document Revised: 06/04/2020 Document Reviewed: 06/04/2020 Elsevier Patient Education  2024 Elsevier Inc.   If you have been instructed to have an in-person evaluation today at a local Urgent Care facility, please use the link below. It will take you to a list of all of our available [facility], including address, phone number and hours of  operation. Please do not delay care.  [facility]  If you or a family member do not have a primary care provider, use the link below to schedule a visit and establish care. When you choose a [facility] primary care physician or advanced practice provider, you gain a long-term partner in health. Find a Primary Care Provider  Learn more about [facility]'s in-office and virtual care options: [facility] - Get Care Now

## 2024-02-03 NOTE — Progress Notes (Signed)
 Virtual Visit Consent   Sharmin Foulk, you are scheduled for a virtual visit with a [facility] provider today. Just as with appointments in the office, your consent must be obtained to participate. Your consent will be active for this visit and any virtual visit you may have with one of our providers in the next 365 days. If you have a MyChart account, a copy of this consent can be sent to you electronically.  As this is a virtual visit, video technology does not allow for your provider to perform a traditional examination. This may limit your provider's ability to fully assess your condition. If your provider identifies any concerns that need to be evaluated in person or the need to arrange testing (such as labs, EKG, etc.), we will make arrangements to do so. Although advances in technology are sophisticated, we cannot ensure that it will always work on either your end or our end. If the connection with a video visit is poor, the visit may have to be switched to a telephone visit. With either a video or telephone visit, we are not always able to ensure that we have a secure connection.  By engaging in this virtual visit, you consent to the provision of healthcare and authorize for your insurance to be billed (if applicable) for the services provided during this visit. Depending on your insurance coverage, you may receive a charge related to this service.  I need to obtain your verbal consent now. Are you willing to proceed with your visit today? Katrina Lin has provided verbal consent on 02/03/2024 for a virtual visit (video or telephone). Roosvelt Mater, [place]  Date: 02/03/2024 11:30 AM   Virtual Visit via Video Note   I, Roosvelt Mater, connected with  Dovey Fatzinger  ([id number], [birth year]) on 02/03/24 at 11:30 AM EST by a video-enabled telemedicine application and verified that I am speaking with the correct person using two identifiers.  Location: Patient: Virtual Visit Location  Patient: Home Provider: Virtual Visit Location Provider: Home Office   I discussed the limitations of evaluation and management by telemedicine and the availability of in person appointments. The patient expressed understanding and agreed to proceed.    History of Present Illness: Katrina Lin is a 28 y.o. who identifies as a female who was assigned female at birth, and is being seen today for c/o waking up with left eye red.  Pt states she woke up with eye shut and crusty and mucus.  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 Pt denies fever but has a stuffy nose like she has allergies. Pt denies itching and does not report vision changes.    HPI: HPI  Problems:  Patient Active Problem List   Diagnosis Date Noted   Acute cystitis with hematuria 08/22/2023   Dysuria 08/22/2023   Vaginal yeast infection 08/22/2023   Chronic bilateral thoracic back pain 02/17/2023   Depression, recurrent 02/17/2023   Chronic rhinitis 12/06/2022   Allergy  with anaphylaxis due to food 12/06/2022   Panic disorder 04/01/2022   Congenital stricture of urethra 08/27/2021   Chronic cystitis 08/27/2021   Muscle spasm 08/27/2021   Chronic midline low back pain with left-sided sciatica 01/12/2018   BMI 29.0-29.9,adult 01/12/2018   Generalized anxiety disorder 11/04/2016    Allergies:  Allergies  Allergen Reactions   Oyster Shell     Positive skin allergy  test 02/06/20.    Shrimp Extract    Medications:  Current Outpatient Medications:  trimethoprim -polymyxin b (POLYTRIM) ophthalmic solution, Place 1 drop into the left eye every 4 (four) hours for 5 days., Disp: 10 mL, Rfl: 0   azelastine  (ASTELIN ) 0.1 % nasal spray, Place 2 sprays into both nostrils 2 (two) times daily as needed for allergies. Use in each nostril as directed (Patient not taking: Reported on 01/16/2024), Disp: 30 mL, Rfl: 5   Cholecalciferol (VITAMIN D3 PO), Take by mouth., Disp: , Rfl:    clonazePAM  (KLONOPIN) 0.5 MG tablet, Take 0.25 mg by mouth daily as needed. (Patient not taking: Reported on 01/16/2024), Disp: , Rfl:    Cyanocobalamin (VITAMIN B-12 PO), Take by mouth. (Patient not taking: Reported on 01/16/2024), Disp: , Rfl:    EPINEPHrine  (AUVI-Q ) 0.3 mg/0.3 mL IJ SOAJ injection, Inject 0.3 mg into the muscle as needed for anaphylaxis. (Patient not taking: Reported on 01/16/2024), Disp: 1 each, Rfl: 1   EPINEPHrine  (AUVI-Q ) 0.3 mg/0.3 mL IJ SOAJ injection, Inject 0.3 mg into the muscle as needed for anaphylaxis. (Patient not taking: Reported on 01/16/2024), Disp: 2 each, Rfl: 1   folic acid (FOLVITE) 1 MG tablet, Take 1 mg by mouth daily., Disp: , Rfl:    naproxen  (NAPROSYN ) 375 MG tablet, Take 1 tablet (375 mg total) by mouth 2 (two) times daily., Disp: 20 tablet, Rfl: 5   norethindrone-ethinyl estradiol (LOESTRIN) 1-20 MG-MCG tablet, Take 1 tablet by mouth daily., Disp: , Rfl:   Observations/Objective: Patient is well-developed, well-nourished in no acute distress.  Resting comfortably at home.  Head is normocephalic, atraumatic.  No labored breathing.  Speech is clear and coherent with logical content.  Patient is alert and oriented at baseline.    Assessment and Plan: 1. Bacterial conjunctivitis of left eye (Primary) - trimethoprim -polymyxin b (POLYTRIM) ophthalmic solution; Place 1 drop into the left eye every 4 (four) hours for 5 days.  Dispense: 10 mL; Refill: 0  -Start eye drops -Pt to F/U with PCP or in person urgent care for worsening symptoms.   Follow Up Instructions: I discussed the assessment and treatment plan with the patient. The patient was provided an opportunity to ask questions and all were answered. The patient agreed with the plan and demonstrated an understanding of the instructions.  A copy of instructions were sent to the patient via MyChart unless otherwise noted below.    The patient was advised to call back or seek an in-person evaluation if the  symptoms worsen or if the condition fails to improve as anticipated.    Roosvelt Mater, PA-C

## 2024-02-06 ENCOUNTER — Ambulatory Visit: Payer: Self-pay | Admitting: Nurse Practitioner

## 2024-02-20 ENCOUNTER — Ambulatory Visit: Payer: BC Managed Care – PPO | Admitting: Family Medicine

## 2024-02-20 ENCOUNTER — Encounter: Payer: Self-pay | Admitting: Family Medicine

## 2024-02-20 VITALS — BP 129/79 | HR 91 | Temp 98.9°F | Ht 60.0 in | Wt 139.6 lb

## 2024-02-20 DIAGNOSIS — Z1329 Encounter for screening for other suspected endocrine disorder: Secondary | ICD-10-CM | POA: Diagnosis not present

## 2024-02-20 DIAGNOSIS — L411 Pityriasis lichenoides chronica: Secondary | ICD-10-CM | POA: Insufficient documentation

## 2024-02-20 DIAGNOSIS — R5383 Other fatigue: Secondary | ICD-10-CM

## 2024-02-20 DIAGNOSIS — Z0001 Encounter for general adult medical examination with abnormal findings: Secondary | ICD-10-CM | POA: Diagnosis not present

## 2024-02-20 DIAGNOSIS — E782 Mixed hyperlipidemia: Secondary | ICD-10-CM | POA: Insufficient documentation

## 2024-02-20 DIAGNOSIS — Z13228 Encounter for screening for other metabolic disorders: Secondary | ICD-10-CM | POA: Diagnosis not present

## 2024-02-20 DIAGNOSIS — Z130 Encounter for screening for diseases of the blood and blood-forming organs and certain disorders involving the immune mechanism: Secondary | ICD-10-CM

## 2024-02-20 NOTE — Progress Notes (Signed)
 Complete physical exam  Patient: Katrina Lin   DOB: 03/09/1995   28 y.o. Female  MRN: [id number]  Subjective:    Chief Complaint  Patient presents with   Annual Exam    Ciela Odonnell is a 28 y.o. female who presents today for a complete physical exam. She reports consuming a well balanced diet. She walks 3 miles twice a week. She generally feels well. She reports sleeping fairly well. She does have additional problems to discuss today.   Saw dermatology for rash. Biopsy showed pityriasis lichenoides chronica. Most of the rash has healed at this point.   Anxiety and associated symptoms - Anxiety improved compared to previous visit - Engaged in therapy and taking different medications - Occasional tiredness and dizziness, attributed to anxiety and sleep issues - would like vitamins checked   Most recent fall risk assessment:    02/20/2024    2:25 PM  Fall Risk   Falls in the past year? 0     Most recent depression screenings:    02/20/2024    2:25 PM 01/16/2024    9:57 AM 04/18/2023    1:32 PM  Depression screen PHQ 2/9  Decreased Interest 0 2 3  Down, Depressed, Hopeless 0 2 3  PHQ - 2 Score 0 4 6  Altered sleeping 0 2 3  Tired, decreased energy 0 2 3  Change in appetite 0 2 3  Feeling bad or failure about yourself  0 1 3  Trouble concentrating 0 1 3  Moving slowly or fidgety/restless 0 0 3  Suicidal thoughts 0 0 3  PHQ-9 Score 0 12 27   Difficult doing work/chores Not difficult at all Somewhat difficult Somewhat difficult     Data saved with a previous flowsheet row definition      02/20/2024    2:25 PM 01/16/2024    9:57 AM 04/18/2023    1:33 PM 04/08/2023    4:13 PM  GAD 7 : Generalized Anxiety Score  Nervous, Anxious, on Edge 3 3 3 3   Control/stop worrying 0 3 3 3   Worry too much - different things 3 3 3 3   Trouble relaxing 0 3 3 3   Restless 0 3 3 3   Easily annoyed or irritable 0 3 3 3   Afraid - awful might happen 0 3 3 3   Total GAD 7 Score  6 21 21 21   Anxiety Difficulty Somewhat difficult Somewhat difficult Somewhat difficult Extremely difficult     Vision:Within last year and Dental: No current dental problems and Receives regular dental care    Patient Care Team: Joesph Annabella HERO, FNP as PCP - General (Family Medicine)   Show/hide medication list[1]  ROS Negative unless specially indicated above in HPI.     Objective:     BP 129/79   Pulse 91   Temp 98.9 F (37.2 C) (Temporal)   Ht 5' (1.524 m)   Wt 139 lb 9.6 oz (63.3 kg)   SpO2 99%   BMI 27.26 kg/m  Wt Readings from Last 3 Encounters:  02/20/24 139 lb 9.6 oz (63.3 kg)  01/31/24 137 lb (62.1 kg)  01/16/24 136 lb 3.2 oz (61.8 kg)      Physical Exam Vitals and nursing note reviewed.  Constitutional:      General: She is not in acute distress.    Appearance: Normal appearance. She is not ill-appearing, toxic-appearing or diaphoretic.  HENT:     Head: Normocephalic.     Right Ear: Tympanic membrane,  ear canal and external ear normal.     Left Ear: Tympanic membrane, ear canal and external ear normal.     Nose: Nose normal.     Mouth/Throat:     Mouth: Mucous membranes are moist.     Pharynx: Oropharynx is clear.  Eyes:     Extraocular Movements: Extraocular movements intact.     Conjunctiva/sclera: Conjunctivae normal.     Pupils: Pupils are equal, round, and reactive to light.  Cardiovascular:     Rate and Rhythm: Normal rate and regular rhythm.     Pulses: Normal pulses.     Heart sounds: Normal heart sounds. No murmur heard.    No friction rub. No gallop.  Pulmonary:     Effort: Pulmonary effort is normal.     Breath sounds: Normal breath sounds.  Abdominal:     General: Bowel sounds are normal. There is no distension.     Palpations: Abdomen is soft. There is no mass.     Tenderness: There is no abdominal tenderness. There is no guarding.  Musculoskeletal:     Cervical back: Normal range of motion and neck supple. No tenderness.      Right lower leg: No edema.     Left lower leg: No edema.  Skin:    General: Skin is warm and dry.     Capillary Refill: Capillary refill takes less than 2 seconds.     Findings: No lesion or rash.  Neurological:     General: No focal deficit present.     Mental Status: She is alert and oriented to person, place, and time.     Cranial Nerves: No cranial nerve deficit.     Motor: No weakness.     Gait: Gait normal.  Psychiatric:        Mood and Affect: Mood normal.        Behavior: Behavior normal.        Thought Content: Thought content normal.        Judgment: Judgment normal.      No results found for any visits on 02/20/24.     Assessment & Plan:    Routine Health Maintenance and Physical Exam  Janae was seen today for annual exam.  Diagnoses and all orders for this visit:  Encounter for general adult medical examination with abnormal findings  Mixed hyperlipidemia Nonfasting panel.  -     Lipid panel  Screening for endocrine, metabolic and immunity disorder -     CMP14+EGFR -     TSH  Pityriasis lichenoides chronica Improving. Managed by dermatology.   Other fatigue Labs pending.  -     Anemia Profile B -     Vitamin D , 25-hydroxy    Immunization History  Administered Date(s) Administered   Tdap 03/08/2013    Health Maintenance  Topic Date Due   Cervical Cancer Screening (Pap smear)  03/14/2022   DTaP/Tdap/Td (2 - Td or Tdap) 04/28/2024 (Originally 03/09/2023)   Influenza Vaccine  06/05/2024 (Originally 10/07/2023)   Hepatitis B Vaccines 19-59 Average Risk (1 of 3 - 19+ 3-dose series) 02/19/2025 (Originally 12/30/2013)   HPV VACCINES (1 - 3-dose SCDM series) 02/19/2025 (Originally 12/30/2021)   Hepatitis C Screening  02/19/2025 (Originally 12/30/2012)   HIV Screening  02/19/2025 (Originally 12/30/2009)   COVID-19 Vaccine (1 - 2025-26 season) 03/07/2025 (Originally 11/07/2023)   Pneumococcal Vaccine  Aged Out   Meningococcal B Vaccine  Aged Out     Discussed health benefits of physical activity,  and encouraged her to engage in regular exercise appropriate for her age and condition.  Problem List Items Addressed This Visit       Musculoskeletal and Integument   Pityriasis lichenoides chronica     Other   Mixed hyperlipidemia   Relevant Orders   Lipid panel   Other Visit Diagnoses       Encounter for general adult medical examination with abnormal findings    -  Primary     Screening for endocrine, metabolic and immunity disorder       Relevant Orders   CMP14+EGFR   TSH     Other fatigue       Relevant Orders   Anemia Profile B   Vitamin D , 25-hydroxy      Return in 1 year (on 02/19/2025).   The patient indicates understanding of these issues and agrees with the plan.  Annabella CHRISTELLA Search, FNP      [1]  Outpatient Medications Prior to Visit  Medication Sig   Cholecalciferol (VITAMIN D3 PO) Take by mouth.   clonazePAM (KLONOPIN) 0.5 MG tablet Take 0.25 mg by mouth daily as needed.   EPINEPHrine  (AUVI-Q ) 0.3 mg/0.3 mL IJ SOAJ injection Inject 0.3 mg into the muscle as needed for anaphylaxis.   folic acid (FOLVITE) 1 MG tablet Take 1 mg by mouth daily.   loratadine  (CLARITIN ) 10 MG tablet Take 10 mg by mouth daily.   naproxen  (NAPROSYN ) 375 MG tablet Take 1 tablet (375 mg total) by mouth 2 (two) times daily.   norethindrone-ethinyl estradiol (LOESTRIN) 1-20 MG-MCG tablet Take 1 tablet by mouth daily.   [DISCONTINUED] azelastine  (ASTELIN ) 0.1 % nasal spray Place 2 sprays into both nostrils 2 (two) times daily as needed for allergies. Use in each nostril as directed (Patient not taking: Reported on 01/16/2024)   [DISCONTINUED] Cyanocobalamin (VITAMIN B-12 PO) Take by mouth. (Patient not taking: Reported on 01/16/2024)   [DISCONTINUED] EPINEPHrine  (AUVI-Q ) 0.3 mg/0.3 mL IJ SOAJ injection Inject 0.3 mg into the muscle as needed for anaphylaxis. (Patient not taking: Reported on 01/16/2024)   No facility-administered  medications prior to visit.

## 2024-02-20 NOTE — Patient Instructions (Signed)

## 2024-02-21 LAB — ANEMIA PROFILE B
Basophils Absolute: 0 x10E3/uL (ref 0.0–0.2)
Basos: 0 %
EOS (ABSOLUTE): 0.1 x10E3/uL (ref 0.0–0.4)
Eos: 1 %
Ferritin: 52 ng/mL (ref 15–150)
Folate: >20 ng/mL (ref >3.0)
Hematocrit: 42.8 % (ref 34.0–46.6)
Hemoglobin: 14.1 g/dL (ref 11.1–15.9)
Immature Grans (Abs): 0 x10E3/uL (ref 0.0–0.1)
Immature Granulocytes: 0 %
Iron Saturation: 16 % (ref 15–55)
Iron: 69 ug/dL (ref 27–159)
Lymphocytes Absolute: 2.1 x10E3/uL (ref 0.7–3.1)
Lymphs: 26 %
MCH: 31.1 pg (ref 26.6–33.0)
MCHC: 32.9 g/dL (ref 31.5–35.7)
MCV: 94 fL (ref 79–97)
Monocytes Absolute: 0.6 x10E3/uL (ref 0.1–0.9)
Monocytes: 7 %
Neutrophils Absolute: 5.2 x10E3/uL (ref 1.4–7.0)
Neutrophils: 66 %
Platelets: 379 x10E3/uL (ref 150–450)
RBC: 4.54 x10E6/uL (ref 3.77–5.28)
RDW: 12.3 % (ref 11.7–15.4)
Retic Ct Pct: 2.2 % (ref 0.6–2.6)
Total Iron Binding Capacity: 439 ug/dL (ref 250–450)
UIBC: 370 ug/dL (ref 131–425)
Vitamin B-12: 412 pg/mL (ref 232–1245)
WBC: 7.9 x10E3/uL (ref 3.4–10.8)

## 2024-02-21 LAB — CMP14+EGFR
ALT: 15 IU/L (ref 0–32)
AST: 17 IU/L (ref 0–40)
Albumin: 4.3 g/dL (ref 4.0–5.0)
Alkaline Phosphatase: 55 IU/L (ref 41–116)
BUN/Creatinine Ratio: 12 (ref 9–23)
BUN: 9 mg/dL (ref 6–20)
Bilirubin Total: <0.2 mg/dL (ref 0.0–1.2)
CO2: 20 mmol/L (ref 20–29)
Calcium: 9.9 mg/dL (ref 8.7–10.2)
Chloride: 103 mmol/L (ref 96–106)
Creatinine, Ser: 0.73 mg/dL (ref 0.57–1.00)
Globulin, Total: 3.3 g/dL (ref 1.5–4.5)
Glucose: 84 mg/dL (ref 70–99)
Potassium: 4.3 mmol/L (ref 3.5–5.2)
Sodium: 137 mmol/L (ref 134–144)
Total Protein: 7.6 g/dL (ref 6.0–8.5)
eGFR: 114 mL/min/1.73 (ref >59)

## 2024-02-21 LAB — LIPID PANEL
Chol/HDL Ratio: 3.7 ratio (ref 0.0–4.4)
Cholesterol, Total: 252 mg/dL — ABNORMAL HIGH (ref 100–199)
HDL: 68 mg/dL (ref >39)
LDL Chol Calc (NIH): 140 mg/dL — ABNORMAL HIGH (ref 0–99)
Triglycerides: 249 mg/dL — ABNORMAL HIGH (ref 0–149)
VLDL Cholesterol Cal: 44 mg/dL — ABNORMAL HIGH (ref 5–40)

## 2024-02-21 LAB — VITAMIN D 25 HYDROXY (VIT D DEFICIENCY, FRACTURES): Vit D, 25-Hydroxy: 37.4 ng/mL (ref 30.0–100.0)

## 2024-02-21 LAB — TSH: TSH: 1.28 u[IU]/mL (ref 0.450–4.500)

## 2024-02-22 ENCOUNTER — Other Ambulatory Visit: Payer: Self-pay

## 2024-02-22 ENCOUNTER — Ambulatory Visit: Payer: Self-pay | Admitting: Family Medicine

## 2024-02-22 ENCOUNTER — Ambulatory Visit: Admitting: Internal Medicine

## 2024-02-22 VITALS — BP 127/84 | HR 88 | Temp 98.0°F | Resp 18 | Ht 60.0 in | Wt 140.6 lb

## 2024-02-22 DIAGNOSIS — J302 Other seasonal allergic rhinitis: Secondary | ICD-10-CM

## 2024-02-22 DIAGNOSIS — T7802XD Anaphylactic reaction due to shellfish (crustaceans), subsequent encounter: Secondary | ICD-10-CM | POA: Diagnosis not present

## 2024-02-22 DIAGNOSIS — J3089 Other allergic rhinitis: Secondary | ICD-10-CM

## 2024-02-22 DIAGNOSIS — L411 Pityriasis lichenoides chronica: Secondary | ICD-10-CM | POA: Diagnosis not present

## 2024-02-22 MED ORDER — AZELASTINE HCL 0.1 % NA SOLN: 2.0000 | Freq: Two times a day (BID) | NASAL | 5 refills | Status: AC | PRN

## 2024-02-22 MED ORDER — LORATADINE 10 MG PO TABS: 10.0000 mg | ORAL_TABLET | Freq: Every day | ORAL | 1 refills | Status: AC | PRN

## 2024-02-22 NOTE — Patient Instructions (Addendum)
 Allergic Rhinitis: - Positive skin test 11/2023: grasses, trees, cats  - Use nasal saline rinses before nose sprays such as with Neilmed Sinus Rinse.  Use distilled water.   - Use Azelastine  2 sprays each nostril twice daily as needed for runny nose, drainage, sneezing, congestion. Aim upward and outward. - Use Claritin  10 mg daily as needed for runny nose, sneezing, itchy watery eyes.  - Consider allergy  shots as long term control of your symptoms by teaching your immune system to be more tolerant of your allergy  triggers  Food Allergy  - please strictly avoid shellfish.  Recommend reintroduction of fish.  Can consider at home introduction or in office challenge.   - for SKIN only reaction, okay to take Claritin  10 mg every 12 hours as needed - for SKIN + ANY additional symptoms, OR IF concern for LIFE THREATENING reaction = Epipen  Autoinjector EpiPen  0.3 mg. - If using Epinephrine  autoinjector, call 911 or go to the ER.   Follow up:  In office challenge to fish whenever available with NP.  Hold all anti-histamines (Azelastine , Xyzal, Allegra, Zyrtec , Claritin , Benadryl, Pepcid) 3 days prior to food challenge.

## 2024-02-22 NOTE — Progress Notes (Signed)
 FOLLOW UP Date of Service/Encounter:  02/22/2024   Subjective:  Katrina Lin (DOB: 1995-05-23) is a 28 y.o. female who [facility] for follow up for food allergies and allergic rhinitis.   History obtained from: chart review and patient. Last seen on 11/23/2022 for skin testing with negative food testing to fish/shellfish and positive to aeroallegens.  Discussed use of PRN Claritin  and Azelastine .  sIgE positive to shellfish, negative to fish.  Reports in Early November, she had a UTI and took macrobid .  She has taken this in the past without any issues.  About 1 week later, she developed an itchy rash.  No hives/welts. She saw urgent care/PCP and eventually Dermatology who did a biopsy and it showed pityriasis lichenoides chronica.  She was worried about a possible Macrobid  allergy ; denies any flaking/peeling rash, hives, swelling, vomiting/diarrhea, trouble breathing, wheezing.   Allergies are doing well since last visit with use of Azelastine  PRN and Claritin  almost daily.  Denies frequent congestion, drainage, runny nose. Interested in fish reintroduction and likely in office.  She will call back on when to schedule.  Her boyfriend likes seafood and this would help open up her diet.  Avoiding shellfish; no accidental exposures.  Has an Epipen , no accidental exposures.    Past Medical History: Past Medical History:  Diagnosis Date   Anxiety    Chronic back pain 08/18/2015   Panic attacks     Objective:  BP 127/84 (BP Location: Right Arm, Patient Position: Sitting, Cuff Size: Normal)   Pulse 88   Temp 98 F (36.7 C) (Temporal)   Resp 18   Ht 5' (1.524 m)   Wt 140 lb 9.6 oz (63.8 kg)   SpO2 100%   BMI 27.46 kg/m  Body mass index is 27.46 kg/m. Physical Exam: GEN: alert, well developed HEENT: clear conjunctiva, nose with mild inferior turbinate hypertrophy, pink nasal mucosa, slight clear rhinorrhea, no cobblestoning HEART:  regular rate and rhythm, no murmur LUNGS: clear to auscultation bilaterally, no coughing, unlabored respiration SKIN: no rashes or lesions  Assessment:   1. Seasonal and perennial allergic rhinitis   2. Anaphylactic shock due to shellfish, subsequent encounter   3. Pityriasis lichenoides chronica     Plan/Recommendations:  Allergic Rhinitis: - Controlled.  - Positive skin test 11/2023: grasses, trees, cats  - Use nasal saline rinses before nose sprays such as with Neilmed Sinus Rinse.  Use distilled water.   - Use Azelastine  2 sprays each nostril twice daily as needed for runny nose, drainage, sneezing, congestion. Aim upward and outward. - Use Claritin  10 mg daily as needed for runny nose, sneezing, itchy watery eyes.  - Consider allergy  shots as long term control of your symptoms by teaching your immune system to be more tolerant of your allergy  triggers  Food Allergy  - please strictly avoid shellfish.  Recommend reintroduction of fish.  Can consider at home introduction or in office challenge.   - 11/2023 SPT negative to shellfish and fish.   - sIgE 11/2023: negative to fish; positive to shellfish.  - Initial rxn: hives and nausea with shellfish  - for SKIN only reaction, okay to take Claritin  10 mg every 12 hours as needed - for SKIN + ANY additional symptoms, OR IF concern for LIFE THREATENING reaction = Epipen  Autoinjector EpiPen  0.3 mg. - If using Epinephrine  autoinjector, call 911 or go to the ER.   Rash - Resolved.  Seen by Dermatology, dx with pityriasis lichenoides  chronica based on biopsy.   - Discussed this is not consistent with an antibiotic allergy .  Possibly related to an infection but unknown true cause.   Follow up:  In office challenge to fish whenever available with NP.  Hold all anti-histamines (Azelastine , Xyzal, Allegra, Zyrtec , Claritin , Benadryl, Pepcid) 3 days prior to food challenge.        Return in about 6 months (around 08/22/2024).  Arleta Blanch,  MD [facility] 

## 2024-02-26 ENCOUNTER — Other Ambulatory Visit: Payer: Self-pay | Admitting: Nurse Practitioner

## 2024-02-26 DIAGNOSIS — B3731 Acute candidiasis of vulva and vagina: Secondary | ICD-10-CM

## 2024-02-26 DIAGNOSIS — N898 Other specified noninflammatory disorders of vagina: Secondary | ICD-10-CM

## 2024-02-27 ENCOUNTER — Telehealth: Admitting: Physician Assistant

## 2024-02-27 DIAGNOSIS — B3731 Acute candidiasis of vulva and vagina: Secondary | ICD-10-CM

## 2024-02-27 MED ORDER — FLUCONAZOLE 150 MG PO TABS: ORAL_TABLET | ORAL | 0 refills | Status: AC

## 2024-02-27 NOTE — Patient Instructions (Signed)
 " Katrina Lin, thank you for joining Katrina Velma Lunger, PA-C for today's virtual visit.  While this provider is not your primary care provider (PCP), if your PCP is located in our provider database this encounter information will be shared with them immediately following your visit.   A [facility] MyChart account gives you access to today's visit and all your visits, tests, and labs performed at [facility]  click here if you don't have a [facility] MyChart account or go to [email or web address]  Consent: (Patient) Katrina Lin provided verbal consent for this virtual visit at the beginning of the encounter.  Current Medications:  Current Outpatient Medications:    fluconazole  (DIFLUCAN ) 150 MG tablet, Take 1 tablet PO once. Repeat in 3 days if needed., Disp: 2 tablet, Rfl: 0   azelastine  (ASTELIN ) 0.1 % nasal spray, Place 2 sprays into both nostrils 2 (two) times daily as needed. Use in each nostril as directed, Disp: 30 mL, Rfl: 5   Cholecalciferol (VITAMIN D3 PO), Take by mouth., Disp: , Rfl:    clonazePAM (KLONOPIN) 0.5 MG tablet, Take 0.25 mg by mouth daily as needed., Disp: , Rfl:    EPINEPHrine  (AUVI-Q ) 0.3 mg/0.3 mL IJ SOAJ injection, Inject 0.3 mg into the muscle as needed for anaphylaxis., Disp: 2 each, Rfl: 1   folic acid (FOLVITE) 1 MG tablet, Take 1 mg by mouth daily., Disp: , Rfl:    loratadine  (CLARITIN ) 10 MG tablet, Take 1 tablet (10 mg total) by mouth daily as needed for allergies., Disp: 90 tablet, Rfl: 1   naproxen  (NAPROSYN ) 375 MG tablet, Take 1 tablet (375 mg total) by mouth 2 (two) times daily., Disp: 20 tablet, Rfl: 5   norethindrone-ethinyl estradiol (LOESTRIN) 1-20 MG-MCG tablet, Take 1 tablet by mouth daily., Disp: , Rfl:    Medications ordered in this encounter:  Meds ordered this encounter  Medications   fluconazole  (DIFLUCAN ) 150 MG tablet    Sig: Take 1 tablet PO once. Repeat in 3 days if needed.    Dispense:  2 tablet    Refill:   0    Supervising Provider:   LAMPTEY, PHILIP O [[id number]]     *If you need refills on other medications prior to your next appointment, please contact your pharmacy*  Follow-Up: Call back or seek an in-person evaluation if the symptoms worsen or if the condition fails to improve as anticipated.  [facility] [phone]  Other Instructions Vaginal Yeast Infection, Adult  Vaginal yeast infection is a condition that causes vaginal discharge as well as soreness, swelling, and redness (inflammation) of the vagina. This is a common condition. Some women get this infection frequently. What are the causes? This condition is caused by a change in the normal balance of the yeast (Candida) and normal bacteria that live in the vagina. This change causes an overgrowth of yeast, which causes the inflammation. What increases the risk? The condition is more likely to develop in women who: Take antibiotic medicines. Have diabetes. Take birth control pills. Are pregnant. Douche often. Have a weak body defense system (immune system). Have been taking steroid medicines for a long time. Frequently wear tight clothing. What are the signs or symptoms? Symptoms of this condition include: White, thick, creamy vaginal discharge. Swelling, itching, redness, and irritation of the vagina. The lips of the vagina (labia) may be affected as well. Pain or a burning feeling while urinating. Pain during sex. How is this diagnosed? This condition is diagnosed based on:  Your medical history. A physical exam. A pelvic exam. Your health care provider will examine a sample of your vaginal discharge under a microscope. Your health care provider may send this sample for testing to confirm the diagnosis. How is this treated? This condition is treated with medicine. Medicines may be over-the-counter or prescription. You may be told to use one or more of the following: Medicine that is taken by mouth  (orally). Medicine that is applied as a cream (topically). Medicine that is inserted directly into the vagina (suppository). Follow these instructions at home: Take or apply over-the-counter and prescription medicines only as told by your health care provider. Do not use tampons until your health care provider approves. Do not have sex until your infection has cleared. Sex can prolong or worsen your symptoms of infection. Ask your health care provider when it is safe to resume sexual activity. Keep all follow-up visits. This is important. How is this prevented?  Do not wear tight clothes, such as pantyhose or tight pants. Wear breathable cotton underwear. Do not use douches, perfumed soap, creams, or powders. Wipe from front to back after using the toilet. If you have diabetes, keep your blood sugar levels under control. Ask your health care provider for other ways to prevent yeast infections. Contact a health care provider if: You have a fever. Your symptoms go away and then return. Your symptoms do not get better with treatment. Your symptoms get worse. You have new symptoms. You develop blisters in or around your vagina. You have blood coming from your vagina and it is not your menstrual period. You develop pain in your abdomen. Summary Vaginal yeast infection is a condition that causes discharge as well as soreness, swelling, and redness (inflammation) of the vagina. This condition is treated with medicine. Medicines may be over-the-counter or prescription. Take or apply over-the-counter and prescription medicines only as told by your health care provider. Do not douche. Resume sexual activity or use of tampons as instructed by your health care provider. Contact a health care provider if your symptoms do not get better with treatment or your symptoms go away and then return. This information is not intended to replace advice given to you by your health care provider. Make sure you  discuss any questions you have with your health care provider. Document Revised: 05/12/2020 Document Reviewed: 05/12/2020 Elsevier Patient Education  2024 [employer]   If you have been instructed to have an in-person evaluation today at a local Urgent Care facility, please use the link below. It will take you to a list of all of our available [facility], including address, phone number and hours of operation. Please do not delay care.  [facility]  If you or a family member do not have a primary care provider, use the link below to schedule a visit and establish care. When you choose a [facility] primary care physician or advanced practice provider, you gain a long-term partner in health. Find a Primary Care Provider  Learn more about [facility]'s in-office and virtual care options: [facility] Get Care Now  "

## 2024-02-27 NOTE — Progress Notes (Signed)
 " Virtual Visit Consent   Katrina Lin, you are scheduled for a virtual visit with a [facility] provider today. Just as with appointments in the office, your consent must be obtained to participate. Your consent will be active for this visit and any virtual visit you may have with one of our providers in the next 365 days. If you have a MyChart account, a copy of this consent can be sent to you electronically.  As this is a virtual visit, video technology does not allow for your provider to perform a traditional examination. This may limit your provider's ability to fully assess your condition. If your provider identifies any concerns that need to be evaluated in person or the need to arrange testing (such as labs, EKG, etc.), we will make arrangements to do so. Although advances in technology are sophisticated, we cannot ensure that it will always work on either your end or our end. If the connection with a video visit is poor, the visit may have to be switched to a telephone visit. With either a video or telephone visit, we are not always able to ensure that we have a secure connection.  By engaging in this virtual visit, you consent to the provision of healthcare and authorize for your insurance to be billed (if applicable) for the services provided during this visit. Depending on your insurance coverage, you may receive a charge related to this service.  I need to obtain your verbal consent now. Are you willing to proceed with your visit today? Jamirra Scheiber has provided verbal consent on 02/27/2024 for a virtual visit (video or telephone). Katrina Lin, [place]  Date: 02/27/2024 11:35 AM   Virtual Visit via Video Note   I, Katrina Lin, connected with  [name]  ([id number], 16-Jul-1995) on 02/27/2024 at 11:30 AM EST by a video-enabled telemedicine application and verified that I am speaking with the correct person using two identifiers.  Location: Patient: Virtual  Visit Location Patient: Home Provider: Virtual Visit Location Provider: Home Office   I discussed the limitations of evaluation and management by telemedicine and the availability of in person appointments. The patient expressed understanding and agreed to proceed.    History of Present Illness: Katrina Lin is a 28 y.o. who identifies as a female who was assigned female at birth, and is being seen today for possible yeast infection. Endorses symptoms starting 2 days ago after recent menstrual period. Notes irritation with thick discharge and itching. Denies concern for STI. Denies change to soaps, lotions or feminine hygiene products.  HPI: HPI  Problems:  Patient Active Problem List   Diagnosis Date Noted   Mixed hyperlipidemia 02/20/2024   Pityriasis lichenoides chronica 02/20/2024   Dysuria 08/22/2023   Vaginal yeast infection 08/22/2023   Chronic bilateral thoracic back pain 02/17/2023   Depression, recurrent 02/17/2023   Chronic rhinitis 12/06/2022   Allergy  with anaphylaxis due to food 12/06/2022   Panic disorder 04/01/2022   Congenital stricture of urethra 08/27/2021   Chronic cystitis 08/27/2021   Muscle spasm 08/27/2021   Chronic midline low back pain with left-sided sciatica 01/12/2018   Generalized anxiety disorder 11/04/2016    Allergies: Allergies[1] Medications: Current Medications[2]  Observations/Objective: Patient is well-developed, well-nourished in no acute distress.  Resting comfortably at home.  Head is normocephalic, atraumatic.  No labored breathing. Speech is clear and coherent with logical content.  Patient is alert and oriented at baseline.   Assessment and Plan: 1. Yeast vaginitis (Primary) - fluconazole  (DIFLUCAN )  150 MG tablet; Take 1 tablet PO once. Repeat in 3 days if needed.  Dispense: 2 tablet; Refill: 0  Classic symptoms. No alarm signs or symptoms. No concern for pregnancy or STI (not currently active). Diflucan  per orders. In-person  evaluation for any non-resolving, new or worsening symptoms despite treatment.  Follow Up Instructions: I discussed the assessment and treatment plan with the patient. The patient was provided an opportunity to ask questions and all were answered. The patient agreed with the plan and demonstrated an understanding of the instructions.  A copy of instructions were sent to the patient via MyChart unless otherwise noted below.   The patient was advised to call back or seek an in-person evaluation if the symptoms worsen or if the condition fails to improve as anticipated.    Katrina Velma Lunger, PA-C    [1]  Allergies Allergen Reactions   Oyster Shell     Positive skin allergy  test 02/06/20.    Shrimp Extract   [2]  Current Outpatient Medications:    fluconazole  (DIFLUCAN ) 150 MG tablet, Take 1 tablet PO once. Repeat in 3 days if needed., Disp: 2 tablet, Rfl: 0   azelastine  (ASTELIN ) 0.1 % nasal spray, Place 2 sprays into both nostrils 2 (two) times daily as needed. Use in each nostril as directed, Disp: 30 mL, Rfl: 5   Cholecalciferol (VITAMIN D3 PO), Take by mouth., Disp: , Rfl:    clonazePAM (KLONOPIN) 0.5 MG tablet, Take 0.25 mg by mouth daily as needed., Disp: , Rfl:    EPINEPHrine  (AUVI-Q ) 0.3 mg/0.3 mL IJ SOAJ injection, Inject 0.3 mg into the muscle as needed for anaphylaxis., Disp: 2 each, Rfl: 1   folic acid (FOLVITE) 1 MG tablet, Take 1 mg by mouth daily., Disp: , Rfl:    loratadine  (CLARITIN ) 10 MG tablet, Take 1 tablet (10 mg total) by mouth daily as needed for allergies., Disp: 90 tablet, Rfl: 1   naproxen  (NAPROSYN ) 375 MG tablet, Take 1 tablet (375 mg total) by mouth 2 (two) times daily., Disp: 20 tablet, Rfl: 5   norethindrone-ethinyl estradiol (LOESTRIN) 1-20 MG-MCG tablet, Take 1 tablet by mouth daily., Disp: , Rfl:   "

## 2024-03-21 ENCOUNTER — Other Ambulatory Visit: Payer: Self-pay | Admitting: Family Medicine

## 2024-03-21 DIAGNOSIS — G8929 Other chronic pain: Secondary | ICD-10-CM

## 2024-05-23 ENCOUNTER — Ambulatory Visit: Admitting: Physical Therapy

## 2024-08-22 ENCOUNTER — Ambulatory Visit: Admitting: Allergy

## 2024-09-24 ENCOUNTER — Ambulatory Visit: Admitting: Physician Assistant
